# Patient Record
Sex: Female | Born: 1935 | Race: Black or African American | Hispanic: No | Smoking: Former smoker
Health system: Southern US, Community
[De-identification: ages and names within clinical notes are randomized; demographics above are authoritative.]

## PROBLEM LIST (undated history)

## (undated) DIAGNOSIS — I70229 Atherosclerosis of native arteries of extremities with rest pain, unspecified extremity: Secondary | ICD-10-CM

## (undated) DIAGNOSIS — I10 Essential (primary) hypertension: Secondary | ICD-10-CM

## (undated) DIAGNOSIS — M542 Cervicalgia: Secondary | ICD-10-CM

## (undated) DIAGNOSIS — I509 Heart failure, unspecified: Secondary | ICD-10-CM

## (undated) DIAGNOSIS — Z91148 Patient's other noncompliance with medication regimen for other reason: Secondary | ICD-10-CM

## (undated) DIAGNOSIS — Z8673 Personal history of transient ischemic attack (TIA), and cerebral infarction without residual deficits: Secondary | ICD-10-CM

## (undated) DIAGNOSIS — Z9114 Patient's other noncompliance with medication regimen: Secondary | ICD-10-CM

## (undated) DIAGNOSIS — R0602 Shortness of breath: Secondary | ICD-10-CM

## (undated) DIAGNOSIS — M1990 Unspecified osteoarthritis, unspecified site: Secondary | ICD-10-CM

## (undated) DIAGNOSIS — I340 Nonrheumatic mitral (valve) insufficiency: Secondary | ICD-10-CM

## (undated) DIAGNOSIS — I998 Other disorder of circulatory system: Secondary | ICD-10-CM

## (undated) DIAGNOSIS — I739 Peripheral vascular disease, unspecified: Secondary | ICD-10-CM

## (undated) DIAGNOSIS — I2510 Atherosclerotic heart disease of native coronary artery without angina pectoris: Secondary | ICD-10-CM

## (undated) DIAGNOSIS — I272 Pulmonary hypertension, unspecified: Secondary | ICD-10-CM

## (undated) DIAGNOSIS — E785 Hyperlipidemia, unspecified: Secondary | ICD-10-CM

## (undated) HISTORY — DX: Patient's other noncompliance with medication regimen for other reason: Z91.148

## (undated) HISTORY — DX: Hyperlipidemia, unspecified: E78.5

## (undated) HISTORY — DX: Pulmonary hypertension, unspecified: I27.20

## (undated) HISTORY — DX: Patient's other noncompliance with medication regimen: Z91.14

## (undated) HISTORY — DX: Essential (primary) hypertension: I10

## (undated) HISTORY — DX: Other disorder of circulatory system: I99.8

## (undated) HISTORY — DX: Atherosclerosis of native arteries of extremities with rest pain, unspecified extremity: I70.229

## (undated) HISTORY — DX: Nonrheumatic mitral (valve) insufficiency: I34.0

## (undated) HISTORY — DX: Shortness of breath: R06.02

## (undated) HISTORY — DX: Peripheral vascular disease, unspecified: I73.9

## (undated) HISTORY — PX: APPENDECTOMY: SHX54

## (undated) HISTORY — DX: Unspecified osteoarthritis, unspecified site: M19.90

## (undated) HISTORY — DX: Atherosclerotic heart disease of native coronary artery without angina pectoris: I25.10

## (undated) HISTORY — PX: OTHER SURGICAL HISTORY: SHX169

## (undated) HISTORY — DX: Personal history of transient ischemic attack (TIA), and cerebral infarction without residual deficits: Z86.73

## (undated) HISTORY — DX: Heart failure, unspecified: I50.9

## (undated) HISTORY — DX: Cervicalgia: M54.2

---

## 1997-06-27 ENCOUNTER — Encounter: Admission: RE | Admit: 1997-06-27 | Discharge: 1997-06-27 | Payer: Self-pay | Admitting: Family Medicine

## 1997-08-03 ENCOUNTER — Encounter: Admission: RE | Admit: 1997-08-03 | Discharge: 1997-08-03 | Payer: Self-pay | Admitting: Family Medicine

## 1997-08-11 ENCOUNTER — Encounter: Admission: RE | Admit: 1997-08-11 | Discharge: 1997-08-11 | Payer: Self-pay | Admitting: Family Medicine

## 1997-11-02 ENCOUNTER — Encounter: Admission: RE | Admit: 1997-11-02 | Discharge: 1997-11-02 | Payer: Self-pay | Admitting: Family Medicine

## 1997-12-21 ENCOUNTER — Encounter: Admission: RE | Admit: 1997-12-21 | Discharge: 1997-12-21 | Payer: Self-pay | Admitting: Family Medicine

## 1998-05-16 ENCOUNTER — Ambulatory Visit (HOSPITAL_COMMUNITY): Admission: RE | Admit: 1998-05-16 | Discharge: 1998-05-16 | Payer: Self-pay | Admitting: Sports Medicine

## 1998-05-16 ENCOUNTER — Encounter: Admission: RE | Admit: 1998-05-16 | Discharge: 1998-05-16 | Payer: Self-pay | Admitting: Sports Medicine

## 1998-06-14 ENCOUNTER — Encounter: Admission: RE | Admit: 1998-06-14 | Discharge: 1998-06-14 | Payer: Self-pay | Admitting: Family Medicine

## 1998-08-14 ENCOUNTER — Encounter: Admission: RE | Admit: 1998-08-14 | Discharge: 1998-08-14 | Payer: Self-pay | Admitting: Family Medicine

## 1998-08-22 ENCOUNTER — Encounter: Admission: RE | Admit: 1998-08-22 | Discharge: 1998-08-22 | Payer: Self-pay | Admitting: Family Medicine

## 1998-09-05 ENCOUNTER — Encounter: Admission: RE | Admit: 1998-09-05 | Discharge: 1998-09-05 | Payer: Self-pay | Admitting: Sports Medicine

## 1998-12-15 ENCOUNTER — Encounter: Payer: Self-pay | Admitting: Emergency Medicine

## 1998-12-15 ENCOUNTER — Emergency Department (HOSPITAL_COMMUNITY): Admission: EM | Admit: 1998-12-15 | Discharge: 1998-12-15 | Payer: Self-pay | Admitting: Emergency Medicine

## 1999-01-03 ENCOUNTER — Encounter: Admission: RE | Admit: 1999-01-03 | Discharge: 1999-01-03 | Payer: Self-pay | Admitting: Family Medicine

## 1999-03-12 DIAGNOSIS — I2510 Atherosclerotic heart disease of native coronary artery without angina pectoris: Secondary | ICD-10-CM

## 1999-03-12 HISTORY — DX: Atherosclerotic heart disease of native coronary artery without angina pectoris: I25.10

## 1999-04-18 ENCOUNTER — Encounter: Admission: RE | Admit: 1999-04-18 | Discharge: 1999-04-18 | Payer: Self-pay | Admitting: Family Medicine

## 1999-04-18 ENCOUNTER — Ambulatory Visit (HOSPITAL_COMMUNITY): Admission: RE | Admit: 1999-04-18 | Discharge: 1999-04-18 | Payer: Self-pay | Admitting: Family Medicine

## 1999-06-05 ENCOUNTER — Ambulatory Visit (HOSPITAL_COMMUNITY): Admission: RE | Admit: 1999-06-05 | Discharge: 1999-06-05 | Payer: Self-pay | Admitting: Cardiology

## 1999-06-05 HISTORY — PX: CARDIAC CATHETERIZATION: SHX172

## 2000-01-14 ENCOUNTER — Encounter: Admission: RE | Admit: 2000-01-14 | Discharge: 2000-01-14 | Payer: Self-pay | Admitting: Family Medicine

## 2000-04-10 ENCOUNTER — Encounter: Admission: RE | Admit: 2000-04-10 | Discharge: 2000-04-10 | Payer: Self-pay | Admitting: Family Medicine

## 2000-08-14 ENCOUNTER — Ambulatory Visit (HOSPITAL_COMMUNITY): Admission: RE | Admit: 2000-08-14 | Discharge: 2000-08-14 | Payer: Self-pay | Admitting: *Deleted

## 2000-08-14 ENCOUNTER — Encounter (INDEPENDENT_AMBULATORY_CARE_PROVIDER_SITE_OTHER): Payer: Self-pay | Admitting: Specialist

## 2000-09-17 ENCOUNTER — Encounter: Admission: RE | Admit: 2000-09-17 | Discharge: 2000-09-17 | Payer: Self-pay | Admitting: Family Medicine

## 2001-04-09 ENCOUNTER — Encounter: Admission: RE | Admit: 2001-04-09 | Discharge: 2001-04-09 | Payer: Self-pay | Admitting: Sports Medicine

## 2001-05-01 ENCOUNTER — Encounter: Admission: RE | Admit: 2001-05-01 | Discharge: 2001-05-01 | Payer: Self-pay | Admitting: Family Medicine

## 2001-09-16 ENCOUNTER — Encounter: Admission: RE | Admit: 2001-09-16 | Discharge: 2001-09-16 | Payer: Self-pay | Admitting: Family Medicine

## 2002-05-24 ENCOUNTER — Encounter: Admission: RE | Admit: 2002-05-24 | Discharge: 2002-05-24 | Payer: Self-pay | Admitting: Family Medicine

## 2004-03-14 ENCOUNTER — Ambulatory Visit: Payer: Self-pay | Admitting: Family Medicine

## 2004-04-02 ENCOUNTER — Ambulatory Visit: Payer: Self-pay | Admitting: Sports Medicine

## 2005-03-20 ENCOUNTER — Ambulatory Visit: Payer: Self-pay | Admitting: Family Medicine

## 2005-10-29 ENCOUNTER — Ambulatory Visit: Payer: Self-pay | Admitting: Sports Medicine

## 2006-05-08 DIAGNOSIS — I5022 Chronic systolic (congestive) heart failure: Secondary | ICD-10-CM

## 2006-05-08 DIAGNOSIS — I270 Primary pulmonary hypertension: Secondary | ICD-10-CM

## 2006-05-08 DIAGNOSIS — E669 Obesity, unspecified: Secondary | ICD-10-CM

## 2006-05-08 DIAGNOSIS — I10 Essential (primary) hypertension: Secondary | ICD-10-CM | POA: Insufficient documentation

## 2006-05-08 DIAGNOSIS — M1990 Unspecified osteoarthritis, unspecified site: Secondary | ICD-10-CM

## 2006-05-08 DIAGNOSIS — E7800 Pure hypercholesterolemia, unspecified: Secondary | ICD-10-CM

## 2006-05-08 DIAGNOSIS — I059 Rheumatic mitral valve disease, unspecified: Secondary | ICD-10-CM

## 2006-06-06 ENCOUNTER — Encounter: Payer: Self-pay | Admitting: Family Medicine

## 2006-07-24 ENCOUNTER — Telehealth: Payer: Self-pay | Admitting: Family Medicine

## 2006-08-07 ENCOUNTER — Ambulatory Visit: Payer: Self-pay | Admitting: Family Medicine

## 2006-08-07 ENCOUNTER — Encounter: Payer: Self-pay | Admitting: Family Medicine

## 2007-01-05 ENCOUNTER — Encounter: Payer: Self-pay | Admitting: Family Medicine

## 2007-01-28 ENCOUNTER — Encounter: Payer: Self-pay | Admitting: Family Medicine

## 2007-02-02 ENCOUNTER — Emergency Department (HOSPITAL_COMMUNITY): Admission: EM | Admit: 2007-02-02 | Discharge: 2007-02-02 | Payer: Self-pay | Admitting: *Deleted

## 2007-02-12 ENCOUNTER — Encounter: Payer: Self-pay | Admitting: Family Medicine

## 2007-03-02 ENCOUNTER — Telehealth: Payer: Self-pay | Admitting: *Deleted

## 2007-03-16 ENCOUNTER — Ambulatory Visit: Payer: Self-pay | Admitting: Family Medicine

## 2007-04-07 ENCOUNTER — Telehealth: Payer: Self-pay | Admitting: Family Medicine

## 2007-05-07 ENCOUNTER — Telehealth: Payer: Self-pay | Admitting: *Deleted

## 2007-05-12 ENCOUNTER — Encounter: Payer: Self-pay | Admitting: Family Medicine

## 2007-11-20 ENCOUNTER — Telehealth (INDEPENDENT_AMBULATORY_CARE_PROVIDER_SITE_OTHER): Payer: Self-pay | Admitting: *Deleted

## 2007-11-23 ENCOUNTER — Encounter: Payer: Self-pay | Admitting: *Deleted

## 2007-11-25 ENCOUNTER — Encounter: Payer: Self-pay | Admitting: *Deleted

## 2007-12-03 ENCOUNTER — Ambulatory Visit: Payer: Self-pay | Admitting: Family Medicine

## 2007-12-03 DIAGNOSIS — M25569 Pain in unspecified knee: Secondary | ICD-10-CM

## 2007-12-07 ENCOUNTER — Telehealth (INDEPENDENT_AMBULATORY_CARE_PROVIDER_SITE_OTHER): Payer: Self-pay | Admitting: Family Medicine

## 2007-12-07 ENCOUNTER — Encounter (INDEPENDENT_AMBULATORY_CARE_PROVIDER_SITE_OTHER): Payer: Self-pay | Admitting: *Deleted

## 2007-12-07 ENCOUNTER — Encounter (INDEPENDENT_AMBULATORY_CARE_PROVIDER_SITE_OTHER): Payer: Self-pay | Admitting: Family Medicine

## 2007-12-07 ENCOUNTER — Encounter: Payer: Self-pay | Admitting: Family Medicine

## 2008-04-28 ENCOUNTER — Encounter (INDEPENDENT_AMBULATORY_CARE_PROVIDER_SITE_OTHER): Payer: Self-pay | Admitting: Family Medicine

## 2008-06-03 ENCOUNTER — Ambulatory Visit: Payer: Self-pay | Admitting: Family Medicine

## 2008-06-03 ENCOUNTER — Encounter (INDEPENDENT_AMBULATORY_CARE_PROVIDER_SITE_OTHER): Payer: Self-pay | Admitting: Family Medicine

## 2008-06-03 DIAGNOSIS — Z780 Asymptomatic menopausal state: Secondary | ICD-10-CM | POA: Insufficient documentation

## 2008-06-13 ENCOUNTER — Telehealth (INDEPENDENT_AMBULATORY_CARE_PROVIDER_SITE_OTHER): Payer: Self-pay | Admitting: Family Medicine

## 2008-06-13 DIAGNOSIS — N183 Chronic kidney disease, stage 3 (moderate): Secondary | ICD-10-CM

## 2008-06-13 DIAGNOSIS — E559 Vitamin D deficiency, unspecified: Secondary | ICD-10-CM | POA: Insufficient documentation

## 2008-06-13 LAB — CONVERTED CEMR LAB
AST: 14 units/L (ref 0–37)
Alkaline Phosphatase: 47 units/L (ref 39–117)
BUN: 22 mg/dL (ref 6–23)
Glucose, Bld: 92 mg/dL (ref 70–99)
Sodium: 140 meq/L (ref 135–145)
Total Bilirubin: 0.3 mg/dL (ref 0.3–1.2)
Total Protein: 7.3 g/dL (ref 6.0–8.3)
Vit D, 25-Hydroxy: 6 ng/mL — ABNORMAL LOW (ref 30–89)

## 2008-06-23 ENCOUNTER — Encounter (INDEPENDENT_AMBULATORY_CARE_PROVIDER_SITE_OTHER): Payer: Self-pay | Admitting: Family Medicine

## 2008-09-05 ENCOUNTER — Encounter (INDEPENDENT_AMBULATORY_CARE_PROVIDER_SITE_OTHER): Payer: Self-pay | Admitting: Family Medicine

## 2009-02-20 ENCOUNTER — Telehealth: Payer: Self-pay | Admitting: Family Medicine

## 2009-03-28 ENCOUNTER — Telehealth: Payer: Self-pay | Admitting: Family Medicine

## 2009-04-04 ENCOUNTER — Ambulatory Visit: Payer: Self-pay | Admitting: Family Medicine

## 2009-04-04 ENCOUNTER — Encounter: Payer: Self-pay | Admitting: Family Medicine

## 2009-04-14 ENCOUNTER — Telehealth: Payer: Self-pay | Admitting: Family Medicine

## 2009-04-24 ENCOUNTER — Encounter: Payer: Self-pay | Admitting: Family Medicine

## 2009-04-24 DIAGNOSIS — D519 Vitamin B12 deficiency anemia, unspecified: Secondary | ICD-10-CM

## 2009-04-24 LAB — CONVERTED CEMR LAB
BUN: 24 mg/dL — ABNORMAL HIGH (ref 6–23)
CO2: 25 meq/L (ref 19–32)
Calcium: 9.9 mg/dL (ref 8.4–10.5)
Chloride: 102 meq/L (ref 96–112)
Cholesterol: 182 mg/dL (ref 0–200)
Creatinine, Ser: 1.07 mg/dL (ref 0.40–1.20)
HCT: 35.8 % — ABNORMAL LOW (ref 36.0–46.0)
HDL: 50 mg/dL (ref >39)
Hemoglobin: 11.2 g/dL — ABNORMAL LOW (ref 12.0–15.0)
MCV: 105.6 fL — ABNORMAL HIGH (ref 78.0–100.0)
Total CHOL/HDL Ratio: 3.6
Triglycerides: 80 mg/dL (ref <150)
WBC: 10.8 10*3/uL — ABNORMAL HIGH (ref 4.0–10.5)

## 2009-10-13 ENCOUNTER — Telehealth: Payer: Self-pay | Admitting: *Deleted

## 2009-10-17 ENCOUNTER — Ambulatory Visit: Payer: Self-pay | Admitting: Family Medicine

## 2009-10-17 ENCOUNTER — Encounter: Payer: Self-pay | Admitting: Family Medicine

## 2009-10-17 LAB — CONVERTED CEMR LAB
AST: 11 units/L (ref 0–37)
Albumin: 4.1 g/dL (ref 3.5–5.2)
Alkaline Phosphatase: 50 units/L (ref 39–117)
BUN: 24 mg/dL — ABNORMAL HIGH (ref 6–23)
Basophils Absolute: 0 10*3/uL (ref 0.0–0.1)
Basophils Relative: 0 % (ref 0–1)
Direct LDL: 91 mg/dL
Eosinophils Absolute: 0.7 10*3/uL (ref 0.0–0.7)
Homocysteine: 20.3 micromoles/L — ABNORMAL HIGH (ref 4.0–15.4)
MCHC: 31.7 g/dL (ref 30.0–36.0)
MCV: 102.9 fL — ABNORMAL HIGH (ref 78.0–100.0)
Monocytes Relative: 8 % (ref 3–12)
Neutrophils Relative %: 67 % (ref 43–77)
Platelets: 231 10*3/uL (ref 150–400)
Potassium: 4.7 meq/L (ref 3.5–5.3)
RDW: 12.9 % (ref 11.5–15.5)

## 2009-11-14 ENCOUNTER — Telehealth: Payer: Self-pay | Admitting: Family Medicine

## 2009-12-26 ENCOUNTER — Ambulatory Visit: Payer: Self-pay | Admitting: Cardiology

## 2009-12-26 ENCOUNTER — Encounter: Payer: Self-pay | Admitting: Family Medicine

## 2009-12-26 LAB — CONVERTED CEMR LAB
Hemoglobin: 11.4 g/dL
WBC: 9.9 10*3/uL

## 2009-12-27 ENCOUNTER — Encounter: Payer: Self-pay | Admitting: Family Medicine

## 2010-01-04 ENCOUNTER — Encounter: Payer: Self-pay | Admitting: Family Medicine

## 2010-01-25 ENCOUNTER — Encounter: Payer: Self-pay | Admitting: Family Medicine

## 2010-02-08 ENCOUNTER — Encounter: Payer: Self-pay | Admitting: Family Medicine

## 2010-04-10 NOTE — Assessment & Plan Note (Signed)
Summary: f/up,tcb   Vital Signs:  Patient profile:   77 year old female Weight:      175.1 pounds Temp:     97.4 degrees F oral Pulse rate:   71 / minute BP sitting:   141 / 72  (right arm) Cuff size:   large  Vitals Entered By: Garen Grams LPN (April 04, 2009 1:35 PM) CC: f/u HTN Is Patient Diabetic? No Pain Assessment Patient in pain? yes     Location: left knee   Primary Care Provider:  Delbert Harness MD  CC:  f/u HTN.  History of Present Illness: 77 yo here for follow-up of HTN, primary care  HYPERTENSION/CAD Meds: Taking and tolerating? yes Home BP's: no Chest Pain: no, uses NTG 1 per month Dyspnea: no Claudication: no Sees cardiologist Dr Clarene Duke  Vitamin D. Deficiency: never took vitamin D supplement that was prescribed last year.     Habits & Providers  Alcohol-Tobacco-Diet     Tobacco Status: never  Current Medications (verified): 1)  Spironolactone 25 Mg  Tabs (Spironolactone) .... Take One Tablet By Mouth Once Daily- Please Have Pt Schedule Appt Prior To Additional Refills 2)  Isosorbide Mononitrate Cr 60 Mg  Tb24 (Isosorbide Mononitrate) .... Take One Tablet Daily- Please Call Office For Appt 3)  Metoprolol Tartrate 50 Mg Tabs (Metoprolol Tartrate) .... Take 1 Tab By Mouth Two Times A Day 4)  Altace 10 Mg Caps (Ramipril) .Marland Kitchen.. 1 By Mouth Once A Day 5)  Amlodipine Besylate 10 Mg  Tabs (Amlodipine Besylate) .Marland Kitchen.. 1 Tablet A Day For Blood Pressure- Call Office For Appt!! 6)  Simvastatin 80 Mg  Tabs (Simvastatin) .Marland Kitchen.. 1 Tab By Mouth At Bedtime 7)  Furosemide 40 Mg Tabs (Furosemide) .... Take One Tablet Two Times A Day  - Prescribed By Dr. Sw[place]iland 8)  Bayer Aspirin 325 Mg Tabs (Aspirin) .... Takes 2 Tablets Daily Per Dr. Swaziland 9)  Nitroquick 0.3 Mg Subl (Nitroglycerin) .... Takes As Needed Per Cards  Allergies: No Known Drug Allergies  Social History: Lives at apartment with neice.  Performs all ADL's.  Widowed many years ago.  no etoh or tob  currently.  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    Karina Willis [phone].  Has an adopted son [name] in [place]. States she would like Vena Austria to be her HPOA.  Does not have one currently.  Full Code at this time.  Review of Systems      See HPI General:  Complains of weight loss; denies fever; intentional. Eyes:  Complains of blurring; denies eye pain. ENT:  Complains of nasal congestion. CV:  Complains of lightheadness; denies chest pain or discomfort, difficulty breathing while lying down, shortness of breath with exertion, and swelling of feet. Resp:  Complains of cough; denies shortness of breath. GI:  Denies abdominal pain, constipation, and diarrhea. GU:  Denies dysuria and incontinence. Neuro:  Denies falling down and weakness.  Physical Exam  General:  alert, well-developed, and well-nourished.  Elderly lady using a cane for balance.  Lungs:  normal respiratory effort, normal breath sounds, no crackles, and no wheezes.   Heart:  normal rate, regular rhythm, no gallop, and no rub. Very slight RUSB systolic murmur.   Impression & Recommendations:  Problem # 1:  HYPERTENSION, BENIGN SYSTEMIC (ICD-401.1) BP slightly above goal.  No changes today.  Continue current meds  Her updated medication list for this problem     Spironolactone 25 Mg Tabs (Spironolactone) .Marland KitchenMarland KitchenMarland KitchenMarland Kitchen  Take one tablet by mouth once daily- please have pt schedule appt prior to additional refills    Metoprolol Tartrate 50 Mg Tabs (Metoprolol tartrate) .Marland Kitchen... Take 1 tab by mouth two times a day    Altace 10 Mg Caps (Ramipril) .Marland Kitchen... 1 by mouth once a day    Amlodipine Besylate 10 Mg Tabs (Amlodipine besylate) .Marland Kitchen... 1 tablet a day for blood pressure- call office for appt!!    Furosemide 40 Mg Tabs (Furosemide) .Marland Kitchen... Take one tablet two times a day  - prescribed by dr. Swaziland  Orders: Comp Met-[facility] [phone]) Lipid-[facility] [phone]) CBC-[facility] ([id number]) [facility]- Est  Level 4  (13086)  Problem # 2:  RENAL INSUFFICIENCY (ICD-588.9) Patient unaware of this.  First noted by prior PCP at last visit.   Cr 1.37 at last visit 05/2008.  Will recheck today. Orders: Comp Met-[facility] ([id number]) CBC-[facility] ([id number]) [facility]- Est  Level 4 (24401)  Problem # 3:  UNSPECIFIED VITAMIN D DEFICIENCY (ICD-268.9)  On last check Vtamin D level was 6.  Never supplemented.  Sent prescription to pharmacy.  WIll recheck after course is complete.  Orders: [facility]- Est  Level 4 (02725)  Problem # 4:  Preventive Health Care (ICD-V70.0)  Flu vaccine given today. Discussed zostvax and finding out coverage discussed end of life wishes and documented in Social history.  Patient was given living will packet to go over with her neive. Patient declined colonoscopy, pap, mammogram.  Agreed this may be reasonable given patient's age and comorbidities.  Orders: [facility]- Est  Level 4 (99214)  Problem # 5:  HYPERCHOLESTEROLEMIA (ICD-272.0) will recheck fasting cholesterol today. Her updated medication list for this problem includes:    Simvastatin 80 Mg Tabs (Simvastatin) .Marland Kitchen... 1 tab by mouth at bedtime  Orders: Lipid-[facility] ([id number]) [facility]- Est  Level 4 (25956)  Complete Medication List: 1)  Spironolactone 25 Mg Tabs (Spironolactone) .... Take one tablet by mouth once daily- please have pt schedule appt prior to additional refills 2)  Isosorbide Mononitrate Cr 60 Mg Tb24 (Isosorbide mononitrate) .... Take one tablet daily- please call office for appt 3)  Metoprolol Tartrate 50 Mg Tabs (Metoprolol tartrate) .... Take 1 tab by mouth two times a day 4)  Altace 10 Mg Caps (Ramipril) .Marland Kitchen.. 1 by mouth once a day 5)  Amlodipine Besylate 10 Mg Tabs (Amlodipine besylate) .Marland Kitchen.. 1 tablet a day for blood pressure- call office for appt!! 6)  Simvastatin 80 Mg Tabs (Simvastatin) .Marland Kitchen.. 1 tab by mouth at bedtime 7)  Furosemide 40 Mg Tabs (Furosemide) .... Take one tablet two times a day  - prescribed by dr. Sw[place]il[place] 8)  Bayer  Aspirin 325 Mg Tabs (Aspirin) .... Takes 2 tablets daily per dr. [place] 9)  Nitroquick 0.3 Mg Subl (Nitroglycerin) .... Takes as needed per cards  Patient Instructions: 1)  Your vitamin D level is very low.  I will send a prescription for high dose vitamin D to [facility].  Please take this once a week until gone. 2)  Look over advance directives paperwork so that you can make sure your end of life wishes are followed and you can say who you would like to speak for you in case you are not able to speak for yourself. 3)  I think you shoudl get the shingles vaccine (zostavax).  Please ask your insurance company how much this will cost you. 4)  Dont forget to make follow-up appt with Dr. Swaziland. 5)  I'd like to see you again in 3-4 months.  Last Colonoscopy:  refused (12/03/2007 1:52:18 PM) Colonoscopy Result Date:  04/04/2009 Colonoscopy Next Due:  Refused Last PAP:  refused (12/03/2007 1:52:18 PM) PAP Next Due:  Refused Last Mammogram:  Done. (05/09/2001 12:00:00 AM) Mammogram Next Due:  Refused Bone Density Next Due: Refused   Prevention & Chronic Care Immunizations   Influenza vaccine: given  (12/03/2007)   Influenza vaccine due: 12/02/2008    Tetanus booster: 08/07/2006: Tdap   Tetanus booster due: 08/06/2016    Pneumococcal vaccine: Done.  (07/09/1996)   Pneumococcal vaccine due: None    H. zoster vaccine: Not documented  Colorectal Screening   Hemoccult: Done.  (03/16/2004)   Hemoccult due: Not Indicated    Colonoscopy: refused  (12/03/2007)   Colonoscopy due: Refused  (04/04/2009)  Other Screening   Pap smear: refused  (12/03/2007)   Pap smear due: Refused  (04/04/2009)    Mammogram: Done.  (05/09/2001)   Mammogram due: Refused  (04/04/2009)    DXA bone density scan: Not documented   DXA scan due: Refused  (04/04/2009)    Smoking status: never  (04/04/2009)  Lipids   Total Cholesterol: Not documented   LDL: Not documented   LDL Direct: 99  (06/03/2008)    HDL: Not documented   Triglycerides: Not documented    SGOT (AST): 14  (06/03/2008)   SGPT (ALT): <8 U/L  (06/03/2008) CMP ordered    Alkaline phosphatase: 47  (06/03/2008)   Total bilirubin: 0.3  (06/03/2008)    Lipid flowsheet reviewed?: Yes   Progress toward LDL goal: Unchanged  Hypertension   Last Blood Pressure: 141 / 72  (04/04/2009)   Serum creatinine: 1.37  (06/03/2008)   Serum potassium 4.5  (06/03/2008) CMP ordered     Hypertension flowsheet reviewed?: Yes   Progress toward BP goal: At goal  Self-Management Support :   Personal Goals (by the next clinic visit) :      Personal blood pressure goal: 140/90  (04/04/2009)     Personal LDL goal: 100  (04/04/2009)    Patient will work on the following items until the next clinic visit to reach self-care goals:     Medications and monitoring: take my medicines every day  (04/04/2009)     Eating: eat more vegetables, use fresh or frozen vegetables, eat foods that are low in salt  (04/04/2009)    Hypertension self-management support: Not documented    Lipid self-management support: Not documented     Appended Document: f/up,tcb    Clinical Lists Changes  Medications: Added new medication of VITAMIN D (ERGOCALCIFEROL) 50000 UNIT CAPS (ERGOCALCIFEROL) take one tablet a week for 8 weeks - Signed Rx of VITAMIN D (ERGOCALCIFEROL) 50000 UNIT CAPS (ERGOCALCIFEROL) take one tablet a week for 8 weeks;  #8 x 0;  Signed;  Entered by: Delbert Harness MD;  Authorized by: Delbert Harness MD;  Method used: Electronically to [facility] [id number], [place], [place], [place]  [address], Ph: [phone], Fax: [phone]    Prescriptions: VITAMIN D (ERGOCALCIFEROL) 50000 UNIT CAPS (ERGOCALCIFEROL) take one tablet a week for 8 weeks  #8 x 0   Entered and Authorized by:   Delbert Harness MD   Signed by:   Delbert Harness MD on 04/06/2009   Method used:   Electronically to        [facility] [id number] (retail)        [address]       [place]  [place], [place]  [address]       Ph: [phone]       Fax: [phone]   RxID:   [id number]

## 2010-04-10 NOTE — Consult Note (Signed)
Summary: [facility]  [facility]   Imported By: De Nurse 01/04/2010 12:13:27  _____________________________________________________________________  External Attachment:    Type:   Image     Comment:   External Document

## 2010-04-10 NOTE — Progress Notes (Signed)
Summary: Rx Req  Phone Note Refill Request Call back at Home Phone [phone] Message from:  Patient  Refills Requested: Medication #1:  SPIRONOLACTONE 25 MG  TABS Take one tablet by mouth once daily  Medication #2:  AMLODIPINE BESYLATE 10 MG  TABS 1 tablet a day for blood pressure  Medication #3:  ISOSORBIDE MONONITRATE CR 60 MG  TB24 Take one tablet daily [employer].   Initial call taken by: Clydell Hakim,  November 14, 2009 8:59 AM    New/Updated Medications: SPIRONOLACTONE 25 MG  TABS (SPIRONOLACTONE) Take one tablet by mouth once daily ISOSORBIDE MONONITRATE CR 60 MG  TB24 (ISOSORBIDE MONONITRATE) Take one tablet daily AMLODIPINE BESYLATE 10 MG  TABS (AMLODIPINE BESYLATE) 1 tablet a day for blood pressure Prescriptions: AMLODIPINE BESYLATE 10 MG  TABS (AMLODIPINE BESYLATE) 1 tablet a day for blood pressure  #30 x 5   Entered and Authorized by:   Delbert Harness MD   Signed by:   Delbert Harness MD on 11/14/2009   Method used:   Electronically to        [facility] [id number] (retail)       [address] [place], [place]  [address]       Ph: [phone]       Fax: [phone]   RxID:   [id number] ISOSORBIDE MONONITRATE CR 60 MG  TB24 (ISOSORBIDE MONONITRATE) Take one tablet daily  #30 x 5   Entered and Authorized by:   Delbert Harness MD   Signed by:   Delbert Harness MD on 11/14/2009   Method used:   Electronically to        [facility] [address] (retail)       [address] [place], [place]  [address]       Ph: [phone]       Fax: [phone]   RxID:   [id number] SPIRONOLACTONE 25 MG  TABS (SPIRONOLACTONE) Take one tablet by mouth once daily  #30 x 5   Entered and Authorized by:   Delbert Harness MD   Signed by:   Delbert Harness MD on 11/14/2009   Method used:   Electronically to        [facility] [address] (retail)       [address] [place], [place]  [address]    Ph: [phone]       Fax: [phone]   RxID:   [id number]   Appended Document: Rx Req notified pt that rxs sent in.

## 2010-04-10 NOTE — Miscellaneous (Signed)
Summary: Appointment Canceled  Appointment status changed to canceled by [employer] on 02/08/2010 8:35 AM.  Cancellation Comments --------------------- echo/ dyspena. gd  Appointment Information ----------------------- Appt Type:  CARDIOLOGY ANCILLARY VISIT      Date:  Thursday, February 08, 2010      Time:  1:00 PM for 60 min   Urgency:  Routine   Made By:  Pearson Grippe  To Visit:  [id number]    Reason:  echo/ dyspena. gd  Appt Comments ------------- -- 02/08/10 8:35: ([facility]) CANCELED -- echo/ dyspena. gd -- 01/26/10 16:37: ([facility]) BOOKED -- Routine CARDIOLOGY ANCILLARY VISIT at 02/08/2010 1:00 PM for 60 min echo/ dyspena. gd

## 2010-04-10 NOTE — Consult Note (Signed)
Summary: [facility]   [facility]   Imported By: Clydell Hakim 03/16/2009 11:48:06  _____________________________________________________________________  External Attachment:    Type:   Image     Comment:   External Document

## 2010-04-10 NOTE — Miscellaneous (Signed)
Summary: CMET, CBC, ESR, Urine Cx  Clinical Lists Changes urine culture- E. Coli pansestive. ESR 45 Observations: Added new observation of PLATELETK/UL: 247 K/uL (12/26/2009 9:35) Added new observation of MCV: 104 fL (12/26/2009 9:35) Added new observation of HGB: 11.4 g/dL (16/12/9602 5:40) Added new observation of WBC COUNT: 9.9 10*3/microliter (12/26/2009 9:35)

## 2010-04-10 NOTE — Assessment & Plan Note (Signed)
Summary: out of meds,tcb   Vital Signs:  Patient profile:   77 year old female Weight:      167 pounds Temp:     98.4 degrees F oral Pulse rate:   93 / minute BP sitting:   114 / 76  (right arm) Cuff size:   regular  Vitals Entered By: Jimmy Footman, CMA (October 17, 2009 1:52 PM) CC: out of medicine Is Patient Diabetic? No Pain Assessment Patient in pain? no        Primary Care Provider:  Delbert Harness MD  CC:  out of medicine.  History of Present Illness: 77 yo here for follow-up  HYPERTENSION/ CHF/ CAD Meds: Taking and tolerating? yes Home BP's no Chest Pain: no Dyspnea: no Edema: no SOB: no Has not seen her cardiologist in over a year  Vit D: completed course  Advanced Directives:  Desires her neice to be medical POA but has not formally done and paperwork.  Has not discussed end ofl ife wishes with her family.  Habits & Providers  Alcohol-Tobacco-Diet     Tobacco Status: never  Current Medications (verified): 1)  Spironolactone 25 Mg  Tabs (Spironolactone) .... Take One Tablet By Mouth Once Daily 2)  Isosorbide Mononitrate Cr 60 Mg  Tb24 (Isosorbide Mononitrate) .... Take One Tablet Daily 3)  Metoprolol Tartrate 50 Mg Tabs (Metoprolol Tartrate) .... Take 1 Tab By Mouth Two Times A Day 4)  Altace 10 Mg Caps (Ramipril) .Marland Kitchen.. 1 By Mouth Once A Day 5)  Amlodipine Besylate 10 Mg  Tabs (Amlodipine Besylate) .Marland Kitchen.. 1 Tablet A Day For Blood Pressure 6)  Simvastatin 80 Mg  Tabs (Simvastatin) .Marland Kitchen.. 1 Tab By Mouth At Bedtime 7)  Furosemide 40 Mg Tabs (Furosemide) .... Take One Tablet Two Times A Day  - Prescribed By Dr. [place] 8)  Bayer Aspirin 325 Mg Tabs (Aspirin) .... Takes 2 Tablets Daily Per Dr. [place] 9)  Nitroquick 0.3 Mg Subl (Nitroglycerin) .... Takes As Needed Per Cards 10)  Vitamin D (Ergocalciferol) 50000 Unit Caps (Ergocalciferol) .... Take One Tablet A Week For 8 Weeks  Allergies: No Known Drug Allergies  Social History: Lives at apartment with neice.   Performs all ADL's.  Widowed many years ago.  no etoh or tob currently.  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    Karina Willis [phone].  Has an adopted son Karina Willis in [place]. States she would like Karina Willis to be her HPOA.  Does not have one currently.  Continues to be Full Code at this time.  Review of Systems      See HPI  Physical Exam  General:  alert, well-developed, and well-nourished.  Elderly lady using a cane for balance.  Lungs:  normal respiratory effort, normal breath sounds, no crackles, and no wheezes.   Heart:  normal rate, regular rhythm, no gallop, and no rub. Very slight RUSB systolic murmur. Extremities:  no edema   Impression & Recommendations:  Problem # 1:  HYPERTENSION, BENIGN SYSTEMIC (ICD-401.1) [place] today than last time.  No orthostasis on history.  Will continue current meds.  If continues to be low, would consider slowly decreasing BP meds.    Her updated medication list for this problem includes:    Spironolactone 25 Mg Tabs (Spironolactone) .Marland Kitchen... Take one tablet by mouth once daily    Metoprolol Tartrate 50 Mg Tabs (Metoprolol tartrate) .Marland Kitchen... Take 1 tab by mouth two times a day    Altace 10 Mg  Caps (Ramipril) .Marland Kitchen... 1 by mouth once a day    Amlodipine Besylate 10 Mg Tabs (Amlodipine besylate) .Marland Kitchen... 1 tablet a day for blood pressure    Furosemide 40 Mg Tabs (Furosemide) .Marland Kitchen... Take one tablet two times a day  - prescribed by dr. [place]  Orders: Comp Met-[facility] [phone]) [facility]- Est  Level 4 (99214)  BP today: 114/76 Prior BP: 141/72 (04/04/2009)  Labs Reviewed: K+: 4.0 (04/04/2009) Creat: : 1.07 (04/04/2009)   Chol: 182 (04/04/2009)   HDL: 50 (04/04/2009)   LDL: 116 (04/04/2009)   TG: 80 (04/04/2009)  Problem # 2:  ANEMIA, MACROCYTIC (ICD-281.9) Uncear etiology.  WIll draw labs today.  Orders: CBC w/Diff-[facility] ([id number]) B12-[facility] [phone]) Folate-[facility] ([id number]) Miscellaneous Lab Charge-[facility] (34742) [facility]- Est  Level 4  (59563)  Problem # 3:  UNSPECIFIED VITAMIN D DEFICIENCY (ICD-268.9) Completed course.  Will draw Vit D level.  Orders: Vit D, 25 [place]-[facility] ([id number]) [facility]- Est  Level 4 (18841)  Problem # 4:  RENAL INSUFFICIENCY (ICD-588.9) Assessment: Unchanged  Was improved on last check.  Likely just an acute elevation in Cr.  Will recheck to make sure it has resolved.  Orders: [facility]- Est  Level 4 (66063)  Problem # 5:  CHF - EJECTION FRACTION < 50% (ICD-428.22) Doing well.  Asked to follow-up with caridology.  Her updated medication list for this problem includes:    Spironolactone 25 Mg Tabs (Spironolactone) .Marland Kitchen... Take one tablet by mouth once daily    Metoprolol Tartrate 50 Mg Tabs (Metoprolol tartrate) .Marland Kitchen... Take 1 tab by mouth two times a day    Altace 10 Mg Caps (Ramipril) .Marland Kitchen... 1 by mouth once a day    Furosemide 40 Mg Tabs (Furosemide) .Marland Kitchen... Take one tablet two times a day  - prescribed by dr. [place]iland    Bayer Aspirin 325 Mg Tabs (Aspirin) .Marland Kitchen... Takes 2 tablets daily per dr. Swaziland  Orders: [facility]- Est  Level 4 (01601)  Problem # 6:  PREVENTIVE HEALTH CARE (ICD-V70.0) discuss advanced directives.  Given booklet to discuss with niece.  Urged to get formal paperwork done as she would like her neiece to be her POA and not her son.  Full code.  Complete Medication List: 1)  Spironolactone 25 Mg Tabs (Spironolactone) .... Take one tablet by mouth once daily 2)  Isosorbide Mononitrate Cr 60 Mg Tb24 (Isosorbide mononitrate) .... Take one tablet daily 3)  Metoprolol Tartrate 50 Mg Tabs (Metoprolol tartrate) .... Take 1 tab by mouth two times a day 4)  Altace 10 Mg Caps (Ramipril) .Marland Kitchen.. 1 by mouth once a day 5)  Amlodipine Besylate 10 Mg Tabs (Amlodipine besylate) .Marland Kitchen.. 1 tablet a day for blood pressure 6)  Simvastatin 80 Mg Tabs (Simvastatin) .Marland Kitchen.. 1 tab by mouth at bedtime 7)  Furosemide 40 Mg Tabs (Furosemide) .... Take one tablet two times a day  - prescribed by dr. [place]il[place] 8)  Bayer Aspirin 325  Mg Tabs (Aspirin) .... Takes 2 tablets daily per dr. [place] 9)  Nitroquick 0.3 Mg Subl (Nitroglycerin) .... Takes as needed per cards 10)  Vitamin D (ergocalciferol) 50000 Unit Caps (Ergocalciferol) .... Take one tablet a week for 8 weeks  Other Orders: Direct LDL-[facility] ([id number])  Patient Instructions: 1)  We will recheck your labs today for yoru vitamin D and your anemia.   2)  It sounds like your cardiologist has plans for your cholesterol medicine- I will forward the most recent labwork to him.  Please make an appt to follow-up with him at  your earliest convenience. 3)  Please speak with your neice about getting medical power of attorney and advanced directives- see packet. 4)  Nice to see you!  Follow-up in 4 months.   Prevention & Chronic Care Immunizations   Influenza vaccine: given  (12/03/2007)   Influenza vaccine due: 12/02/2008    Tetanus booster: 08/07/2006: Tdap   Tetanus booster due: 08/06/2016    Pneumococcal vaccine: Done.  (07/09/1996)   Pneumococcal vaccine due: None    H. zoster vaccine: Not documented  Colorectal Screening   Hemoccult: Done.  (03/16/2004)   Hemoccult due: Not Indicated    Colonoscopy: refused  (12/03/2007)   Colonoscopy due: Refused  (04/04/2009)  Other Screening   Pap smear: refused  (12/03/2007)   Pap smear due: Refused  (04/04/2009)    Mammogram: Done.  (05/09/2001)   Mammogram due: Refused  (04/04/2009)    DXA bone density scan: Not documented   DXA scan due: Refused  (04/04/2009)    Smoking status: never  (10/17/2009)  Lipids   Total Cholesterol: 182  (04/04/2009)   LDL: 116  (04/04/2009)   LDL Direct: 99  (06/03/2008)   HDL: 50  (04/04/2009)   Triglycerides: 80  (04/04/2009)    SGOT (AST): 10  (04/04/2009)   SGPT (ALT): <8 U/L  (04/04/2009) CMP ordered    Alkaline phosphatase: 47  (04/04/2009)   Total bilirubin: 0.4  (04/04/2009)    Lipid flowsheet reviewed?: Yes   Progress toward LDL goal: At  goal  Hypertension   Last Blood Pressure: 114 / 76  (10/17/2009)   Serum creatinine: 1.07  (04/04/2009)   Serum potassium 4.0  (04/04/2009) CMP ordered     Hypertension flowsheet reviewed?: Yes   Progress toward BP goal: At goal  Self-Management Support :   Personal Goals (by the next clinic visit) :      Personal blood pressure goal: 130/80  (10/17/2009)     Personal LDL goal: 100  (04/04/2009)    Hypertension self-management support: Not documented    Hypertension self-management support not done because: Good outcomes  (10/17/2009)    Lipid self-management support: Not documented

## 2010-04-10 NOTE — Progress Notes (Signed)
Summary: Rx Req  Phone Note Refill Request Call back at Home Phone [phone] Message from:  Patient  Refills Requested: Medication #1:  AMLODIPINE BESYLATE 10 MG  TABS 1 tablet a day for blood pressure- call office for appt!!  Medication #2:  ISOSORBIDE MONONITRATE CR 60 MG  TB24 Take one tablet daily- Please call office for appt  Medication #3:  SPIRONOLACTONE 25 MG  TABS Take one tablet by mouth once daily- please have pt schedule appt prior to additional refills PT USES [facility].  PT IS COMING IN ON 04/04/09 FOR F/UP. HAD TO MISS LAST APPT DUE TO DEATH IN FAMILY.  Initial call taken by: Clydell Hakim,  March 28, 2009 8:48 AM  Follow-up for Phone Call        will forward to MD. Follow-up by: Theresia Lo RN,  March 28, 2009 8:50 AM    Prescriptions: AMLODIPINE BESYLATE 10 MG  TABS (AMLODIPINE BESYLATE) 1 tablet a day for blood pressure- call office for appt!!  #14 x 0   Entered and Authorized by:   Delbert Harness MD   Signed by:   Delbert Harness MD on 03/28/2009   Method used:   Electronically to        [facility] [address] (retail)       [address] [place], [place]  [address]       Ph: [phone]       Fax: [phone]   RxID:   [id number] SPIRONOLACTONE 25 MG  TABS (SPIRONOLACTONE) Take one tablet by mouth once daily- please have pt schedule appt prior to additional refills  #14 x 0   Entered and Authorized by:   Delbert Harness MD   Signed by:   Delbert Harness MD on 03/28/2009   Method used:   Electronically to        [facility] [address] (retail)       [address] [place], [place]  [address]       Ph: [phone]       Fax: [phone]   RxID:   [id number] ISOSORBIDE MONONITRATE CR 60 MG  TB24 (ISOSORBIDE MONONITRATE) Take one tablet daily- Please call office for appt  #14 x 0   Entered and Authorized by:   Delbert Harness MD   Signed by:   Delbert Harness MD on 03/28/2009   Method  used:   Electronically to        [facility] [address] (retail)       [address]       [place], [place]  [address]       Ph: [phone]       Fax: [phone]   RxID:   [id number]

## 2010-04-10 NOTE — Progress Notes (Signed)
Summary: refill;  Phone Note Refill Request Call back at Home Phone [phone] Message from:  Patient  Refills Requested: Medication #1:  SPIRONOLACTONE 25 MG  TABS Take one tablet by mouth once daily- please have pt schedule appt prior to additional refills  Medication #2:  AMLODIPINE BESYLATE 10 MG  TABS 1 tablet a day for blood pressure- call office for appt!!  Medication #3:  ISOSORBIDE MONONITRATE CR 60 MG  TB24 Take one tablet daily- Please call office for appt was supposed to have these refilled last week at [facility]  Initial call taken by: De Nurse,  April 14, 2009 9:34 AM  Follow-up for Phone Call        TO PCP Follow-up by: Golden Circle RN,  April 14, 2009 9:52 AM  Additional Follow-up for Phone Call Additional follow up Details #1::        Rx faxed to pharmacy Additional Follow-up by: Delbert Harness MD,  April 14, 2009 12:14 PM    New/Updated Medications: SPIRONOLACTONE 25 MG  TABS (SPIRONOLACTONE) Take one tablet by mouth once daily ISOSORBIDE MONONITRATE CR 60 MG  TB24 (ISOSORBIDE MONONITRATE) Take one tablet daily AMLODIPINE BESYLATE 10 MG  TABS (AMLODIPINE BESYLATE) 1 tablet a day for blood pressure SIMVASTATIN 80 MG  TABS (SIMVASTATIN) 1 tab by mouth at bedtime Prescriptions: SIMVASTATIN 80 MG  TABS (SIMVASTATIN) 1 tab by mouth at bedtime  #30 x 5   Entered and Authorized by:   Delbert Harness MD   Signed by:   Delbert Harness MD on 04/14/2009   Method used:   Electronically to        [facility] [address] (retail)       [address] [place], [place]  [address]       Ph: [phone]       Fax: [phone]   RxID:   [id number] AMLODIPINE BESYLATE 10 MG  TABS (AMLODIPINE BESYLATE) 1 tablet a day for blood pressure  #30 x 5   Entered and Authorized by:   Delbert Harness MD   Signed by:   Delbert Harness MD on 04/14/2009   Method used:   Electronically to        [facility] [address] (retail)       [address]     [place], [place]  [address]       Ph: [phone]       Fax: [phone]   RxID:   [id number] SPIRONOLACTONE 25 MG  TABS (SPIRONOLACTONE) Take one tablet by mouth once daily  #30 x 5   Entered and Authorized by:   Delbert Harness MD   Signed by:   Delbert Harness MD on 04/14/2009   Method used:   Electronically to        [facility] [address] (retail)       [address] [place], [place]  [address]       Ph: [phone]       Fax: [phone]   RxID:   [id number] ISOSORBIDE MONONITRATE CR 60 MG  TB24 (ISOSORBIDE MONONITRATE) Take one tablet daily  #30 x 5   Entered and Authorized by:   Delbert Harness MD   Signed by:   Delbert Harness MD on 04/14/2009  Method used:   Electronically to        [facility] [address] (retail)       [address] [place], [place]  [address]       Ph: [phone]       Fax: [phone]   RxID:   [id number]

## 2010-04-10 NOTE — Progress Notes (Signed)
Summary: Rx Req  Phone Note Refill Request Call back at Home Phone [phone] Message from:  Patient  Refills Requested: Medication #1:  SPIRONOLACTONE 25 MG  TABS Take one tablet by mouth once daily  Medication #2:  AMLODIPINE BESYLATE 10 MG  TABS 1 tablet a day for blood pressure  Medication #3:  ISOSORBIDE MONONITRATE CR 60 MG  TB24 Take one tablet daily [facility]  CAN SHE GET ENOUGH TO LAST HER TILL SHE COMES IN ON THE 9TH.  SHE IS OUT OF ALL OF THEM.  Initial call taken by: Clydell Hakim,  October 13, 2009 8:43 AM  Follow-up for Phone Call        Sent in enough for 76-month informed pt to keep appt. Follow-up by: Garen Grams LPN,  October 13, 2009 4:48 PM    Prescriptions: AMLODIPINE BESYLATE 10 MG  TABS (AMLODIPINE BESYLATE) 1 tablet a day for blood pressure  #30 x 0   Entered by:   Garen Grams LPN   Authorized by:   Delbert Harness MD   Signed by:   Garen Grams LPN on 57/84/6962   Method used:   Electronically to        [facility] [address] (retail)       [address]       [place], [place]  [address]       Ph: [phone]       Fax: [phone]   RxID:   [id number] ISOSORBIDE MONONITRATE CR 60 MG  TB24 (ISOSORBIDE MONONITRATE) Take one tablet daily  #30 x 0   Entered by:   Garen Grams LPN   Authorized by:   Delbert Harness MD   Signed by:   Garen Grams LPN on 95/18/8416   Method used:   Electronically to        [facility] [address] (retail)       [address]       [place], [place]  [address]       Ph: [phone]       Fax: [phone]   RxID:   [id number] SPIRONOLACTONE 25 MG  TABS (SPIRONOLACTONE) Take one tablet by mouth once daily  #30 x 0   Entered by:   Garen Grams LPN   Authorized by:   Delbert Harness MD   Signed by:   Garen Grams LPN on 60/73/7106   Method used:   Electronically to        [facility] [address] (retail)       [address]       [place], [place]  [address]       Ph: [phone]       Fax: [phone]   RxID:   [id number]

## 2010-04-10 NOTE — Miscellaneous (Signed)
Summary: Appointment Canceled  Appointment status changed to canceled by [employer] on 01/04/2010 8:49 AM.  Cancellation Comments --------------------- echo/dyspnea/mt  Appointment Information ----------------------- Appt Type:  CARDIOLOGY ANCILLARY VISIT      Date:  Friday, January 05, 2010      Time:  11:30 AM for 60 min   Urgency:  Routine   Made By:  Pearson Grippe  To Visit:  [id number]    Reason:  echo/dyspnea/mt  Appt Comments ------------- -- 01/04/10 8:49: ([facility]) CANCELED -- echo/dyspnea/mt -- 01/02/10 11:46: ([facility]) BOOKED -- Routine CARDIOLOGY ANCILLARY VISIT at 01/05/2010 11:30 AM for 60 min echo/dyspnea/mt -- 12/26/09 15:45: ([facility]) BOOKED -- Routine CARDIOLOGY ANCILLARY VISIT at 1

## 2010-04-10 NOTE — Letter (Signed)
Summary: Results Follow-up Letter  [facility]  [address]   [place], [place] [address]   Phone: [phone]  Fax: [phone]    04/24/2009  [address] [place], [place]  [address]  Dear [name],   The following are the results of your recent test(s):  Your bloodwork shows you are anemic (low hemoglobin).  I would like you to make sure you are taking a daily multivitamin and schedule an appointment to talk about results and get labwork done.  Sincerely,  Delbert Harness MD [facility]           Appended Document: Results Follow-up Letter mailed.

## 2010-04-10 NOTE — Miscellaneous (Signed)
Summary: systolic heart failure  Clinical Lists Changes  Problems: Changed problem from CHF - EJECTION FRACTION < 50% (ICD-428.22) to CHRONIC SYSTOLIC HEART FAILURE (ICD-428.22)   Per last cardiology note from Dr. Swaziland

## 2010-07-24 ENCOUNTER — Encounter: Payer: Self-pay | Admitting: Sports Medicine

## 2010-07-27 NOTE — Procedures (Signed)
[facility]  Patient:    Karina Willis, Karina Willis                       MRN: [id number] Proc. Date: 08/14/00 Adm. Date:  [id number] Attending:  Sabino Gasser CC:         Peter M. Swaziland, M.D.   Procedure Report  PROCEDURE:  Colonoscopy.  INDICATIONS:  Family history of colon polyps, colon cancer screening.  ANESTHESIA:  Demerol 60 mg, Versed 5 mg.  DESCRIPTION OF PROCEDURE:  With the patient mildly sedated in the left lateral decubitus position, subsequently rolled to her back, the Olympus videoscopic colonoscope was inserted into the rectum and passed under direct vision to the cecum, identified by the ileocecal valve and appendiceal orifice.  Adjacent to the appendiceal orifice was a bilobulated, small polyp, approximately 7 mm in length and using hot biopsy forceps technique, this was biopsied.  There was some bleeding with this, and we had to cauterize the remainder of the polyp base.  At this point, there was no further bleeding noted.  The colonoscope was then withdrawn taking circumferential views of the entire colonic mucosa stopping in only then in the sigmoid colon area where diverticulosis was noted, until we pulled back to the rectum which appeared normal on direct view and showed normal on retroflexed view as well.  The endoscope was straightened and withdrawn.  The patients vital signs and pulse oximeter remained stable. The patient tolerated the procedure well without apparent complications.  FINDINGS:  Polyp of cecum, removed.  Diverticulosis of sigmoid colon.  PLAN:  We will ask the patient to hold aspirin for at least a week for the time being.  Have patient call me for results of biopsy and follow up with me as an outpatient. DD:  08/14/00 TD:  08/14/00 Job: [id number]

## 2010-07-27 NOTE — Cardiovascular Report (Signed)
[facility]  Patient:    Karina Willis, Karina Willis                       MRN: [id number] Proc. Date: 06/05/99 Adm. Date:  [id number] Disc. Date: [id number] Attending:  [place], Peter Manning                        Cardiac Catheterization  PROCEDURES:  Right and left heart catheterization, coronary angiography.  INDICATION FOR PROCEDURE:  The patient is a 77 year old black female who presents with evidence of congestive heart failure with symptoms of dyspnea and chest pain on exertion.  ACCESS:  Via the right femoral artery and vein using standard Seldinger technique.  EQUIPMENT:  The 6-French 4-cm right and left Judkins catheters, a 6-French pigtail catheter, a 6-French arterial sheath, an 8-French venous sheath, a 7-French balloon-tip Swan-Ganz catheter.  CONTRAST:  Omnipaque, 170 cc.  MEDICATIONS:  IV Versed, 1 mg.  COMMENTARY:  The patient tolerated the procedure well without complications.  HEMODYNAMIC DATA:  Aortic pressure is 177/91 with a mean of 125.  Left ventricular pressure is 175 with an EDP of 24 mmHg.  Right atrial pressure is 16/11 with a mean of 11 mmHg.  Right ventricular pressure is 67 with an EDP of 15 mmHg.  Pulmonary artery pressure is 80/45 with a mean of 59 mmHg. Pulmonary capillary wedge pressure is 46/50 with a mean of 44 mmHg.  By simultaneous recordings, there does not appear to be significant aortic or mitral valve gradients.  While V waves were somewhat prominent, they do not appear to be giant V waves.  There was no significant evidence of a shunt. Cardiac output by thermodilution is 3.2 liters/minute with an index of 1.65. Cardiac output by Fick is 3.27 with an index of 1.68.  Pulmonary vascular resistance was 5.6 Wood units.  ANGIOGRAPHIC DATA:  A left ventricular angiography was performed in the RAO view.  This demonstrates enlarged left ventricular chamber size.  There is akinesia of the inferior base.  Otherwise,  global hypokinesia.  Exact ejection fraction was difficult to estimate given significant ventricular ectopy; however, left ventricular function appears to be moderately reduced with ejection fraction estimated at 35-40%.  There appears to be moderate to severe mitral insufficiency, graded at 3+.  Again, this appears worse with ventricular ectopy.  The aortic valve appears normal.  1. The left coronary demonstrates a shared ostium with the LAD and left    circumflex. 2. The LAD is a very large vessel which wraps around the apex and supplies    part of the inferior wall.  This vessel has a 50% stenosis in the mid    vessel; otherwise, diffuse wall irregularities. 3. The left circumflex coronary has an 80% stenosis at its ostium.  It has    a 99% stenosis prior to the first obtuse marginal vessel and then is    occluded. 4. The right coronary artery arises normally and is occluded proximally.  FINAL INTERPRETATION: 1. Severe two-vessel occlusive atherosclerotic coronary artery disease. 2. Borderline stenosis in the mid left anterior descending artery. 3. Moderate left ventricular dysfunction. 4. Moderate to severe mitral insufficiency. 5. Severe pulmonary hypertension with elevated left ventricular filling    pressures. DD:  06/08/99 TD:  06/09/99 Job: [id number]

## 2010-07-30 ENCOUNTER — Encounter: Payer: Self-pay | Admitting: Cardiology

## 2010-07-30 DIAGNOSIS — Z8673 Personal history of transient ischemic attack (TIA), and cerebral infarction without residual deficits: Secondary | ICD-10-CM | POA: Insufficient documentation

## 2010-07-30 DIAGNOSIS — I10 Essential (primary) hypertension: Secondary | ICD-10-CM | POA: Insufficient documentation

## 2010-07-30 DIAGNOSIS — M1990 Unspecified osteoarthritis, unspecified site: Secondary | ICD-10-CM | POA: Insufficient documentation

## 2010-07-30 DIAGNOSIS — I340 Nonrheumatic mitral (valve) insufficiency: Secondary | ICD-10-CM | POA: Insufficient documentation

## 2010-07-30 DIAGNOSIS — E785 Hyperlipidemia, unspecified: Secondary | ICD-10-CM | POA: Insufficient documentation

## 2010-07-30 DIAGNOSIS — I5032 Chronic diastolic (congestive) heart failure: Secondary | ICD-10-CM | POA: Insufficient documentation

## 2010-07-30 DIAGNOSIS — I2510 Atherosclerotic heart disease of native coronary artery without angina pectoris: Secondary | ICD-10-CM | POA: Insufficient documentation

## 2010-07-30 DIAGNOSIS — R0602 Shortness of breath: Secondary | ICD-10-CM | POA: Insufficient documentation

## 2010-08-01 ENCOUNTER — Ambulatory Visit: Payer: Medicare Other | Admitting: Cardiology

## 2010-08-01 ENCOUNTER — Ambulatory Visit (INDEPENDENT_AMBULATORY_CARE_PROVIDER_SITE_OTHER): Payer: Medicaid Other | Admitting: Cardiology

## 2010-08-01 ENCOUNTER — Encounter: Payer: Self-pay | Admitting: Cardiology

## 2010-08-01 DIAGNOSIS — I2510 Atherosclerotic heart disease of native coronary artery without angina pectoris: Secondary | ICD-10-CM

## 2010-08-01 DIAGNOSIS — I10 Essential (primary) hypertension: Secondary | ICD-10-CM

## 2010-08-01 DIAGNOSIS — I059 Rheumatic mitral valve disease, unspecified: Secondary | ICD-10-CM

## 2010-08-01 DIAGNOSIS — I509 Heart failure, unspecified: Secondary | ICD-10-CM

## 2010-08-01 DIAGNOSIS — I340 Nonrheumatic mitral (valve) insufficiency: Secondary | ICD-10-CM

## 2010-08-01 MED ORDER — METOPROLOL TARTRATE 50 MG PO TABS: 50.0000 mg | ORAL_TABLET | Freq: Two times a day (BID) | ORAL | Status: DC

## 2010-08-01 MED ORDER — SIMVASTATIN 40 MG PO TABS: 40.0000 mg | ORAL_TABLET | Freq: Every day | ORAL | Status: DC

## 2010-08-01 MED ORDER — ISOSORBIDE MONONITRATE ER 60 MG PO TB24: 60.0000 mg | ORAL_TABLET | Freq: Every day | ORAL | Status: DC

## 2010-08-01 MED ORDER — SPIRONOLACTONE 25 MG PO TABS: 25.0000 mg | ORAL_TABLET | Freq: Every day | ORAL | Status: DC

## 2010-08-01 MED ORDER — FUROSEMIDE 40 MG PO TABS: 40.0000 mg | ORAL_TABLET | Freq: Two times a day (BID) | ORAL | Status: DC

## 2010-08-01 MED ORDER — AMLODIPINE BESYLATE 10 MG PO TABS: 10.0000 mg | ORAL_TABLET | Freq: Every day | ORAL | Status: DC

## 2010-08-01 MED ORDER — NITROGLYCERIN 0.4 MG SL SUBL: 0.4000 mg | SUBLINGUAL_TABLET | SUBLINGUAL | Status: DC | PRN

## 2010-08-01 MED ORDER — RAMIPRIL 10 MG PO CAPS: 10.0000 mg | ORAL_CAPSULE | Freq: Every day | ORAL | Status: DC

## 2010-08-01 NOTE — Assessment & Plan Note (Signed)
She has chronic moderate to severe mitral insufficiency. We will need to reassess this with echocardiogram. I think it is unlikely that she would be a surgical candidate.

## 2010-08-01 NOTE — Assessment & Plan Note (Signed)
She is not having significant anginal symptoms at this time. We will resume her prior medications.

## 2010-08-01 NOTE — Assessment & Plan Note (Signed)
Blood pressure severely elevated today related to medical noncompliance and dietary noncompliance. Again we'll resume her medications and sodium restriction. We will followup again in 3 weeks to monitor her response.

## 2010-08-01 NOTE — Progress Notes (Signed)
Karina Willis Date of Birth: Apr 11, 1932   History of Present Illness: Karina Willis is seen today for followup. She reports that she ran out of all of her medications over 2 weeks ago. She now complains of increasing shortness of breath on exertion, weight gain, edema, and headache. She denies any significant chest pain. She cannot explain why her medications lapsed.  Current Outpatient Prescriptions on File Prior to Visit  Medication Sig Dispense Refill  . aspirin 325 MG tablet Takes 2 tablets daily per Dr. Swaziland       . cyanocobalamin ([facility] VITAMIN B12) 2000 MCG tablet Take 2,000 mcg by mouth daily.        . ergocalciferol (VITAMIN D2) 50000 UNIT capsule Take 50,000 Units by mouth once a week. For 8 weeks for low vitamin D levels.       Marland Kitchen DISCONTD: amLODipine (NORVASC) 10 MG tablet Take 10 mg by mouth daily. For blood pressure       . DISCONTD: isosorbide mononitrate (IMDUR) 60 MG 24 hr tablet Take 60 mg by mouth daily.        Marland Kitchen DISCONTD: metoprolol (LOPRESSOR) 50 MG tablet Take 50 mg by mouth 2 (two) times daily.        Marland Kitchen DISCONTD: nitroGLYCERIN (NITROQUICK) 0.3 MG SL tablet Takes as needed per cards       . DISCONTD: ramipril (ALTACE) 10 MG capsule Take 10 mg by mouth daily.        Marland Kitchen DISCONTD: simvastatin (ZOCOR) 40 MG tablet Take 80 mg by mouth at bedtime.       Marland Kitchen DISCONTD: spironolactone (ALDACTONE) 25 MG tablet Take 25 mg by mouth daily.        Marland Kitchen DISCONTD: furosemide (LASIX) 40 MG tablet Take 40 mg by mouth 2 (two) times daily. ( NOT TAKING )        Allergies  Allergen Reactions  . Penicillins     Past Medical History  Diagnosis Date  . CHF (congestive heart failure)   . Systolic dysfunction     CHRONIC, EF 25-30%  . Mitral insufficiency     SEVERE  . Coronary artery disease 2001    WITH DOCUMENTED TOTAL OCCLUSION OF THE RIGHT CORONARY AND THE LEFT CIRCUMFLEX CORONARY  . SOB (shortness of breath)   . Hypertension   . Hyperlipidemia   . History of TIAs   . Arthritis      Past Surgical History  Procedure Date  . Cardiac catheterization 06/05/1999    ENLARGED LEFT VENTRICULAR SIZE. THERE IS AKINESIA OF THE INFERIOR BASE. LEFT VENTRICULAR  FUNCTIONS APPEAR TO BE MODERATELY REDUCED WITH EF 35-40%  . Appendectomy   . Left arm surgery     History  Smoking status  . Never Smoker   Smokeless tobacco  . Never Used    History  Alcohol Use No    Family History  Problem Relation Age of Onset  . Heart attack Mother   . Heart attack Father   . Heart disease Sister   . Heart attack Brother   . Heart attack Brother   . Heart attack Brother   . Heart disease Sister     Review of Systems: The review of systems is positive for weight gain of 17 pounds.  She has increased edema and dyspnea on exertion. She denies any dyspnea or palpitations. She's had no chest pain. She admits that she cooks with salt but doesn't seize and her food after it is cooked.All other systems  were reviewed and are negative.  Physical Exam: BP 188/106  Pulse 88  Ht 4\' 11"  (1.499 m)  Wt 184 lb (83.462 kg)  BMI 37.16 kg/m2 She is an obese black female who is short of breath walking just 10 feet. She walks with a cane. Her HEENT exam is unremarkable. She has poor dentition. She has positive jugular venous distention. There are no bruits. Lungs reveal diminished breath sounds in the bases. Cardiac exam reveals a grade 2/6 systolic murmur the apex without S3. Abdomen is obese, soft, nontender without masses or bruits. Her pedal pulses are palpable. She has 2+ edema. LABORATORY DATA:   Assessment / Plan:

## 2010-08-01 NOTE — Patient Instructions (Signed)
We will refill all your medications today.  Do not eat or cook with salt.  Please take your medications as they are prescribed. If you are going to run out let us know.  We will schedule a follow up visit with Lawson Fiscal our nurse practitioner in 3 weeks with lab work.

## 2010-08-01 NOTE — Assessment & Plan Note (Signed)
We scheduled Karina Willis for an echocardiogram in October. She failed to show. She is clearly volume overloaded at this point with medical and dietary noncompliance. We will resume all of her prior medications. I have stressed the importance of taking her medications as prescribed. We also reviewed recommendations for sodium restriction including not cooking or seasoning with salt. I'll have her return in 3 weeks for followup we will check repeat lab work at that time. Once she is better compensated we can reorder an echocardiogram.

## 2010-08-20 ENCOUNTER — Other Ambulatory Visit: Payer: Self-pay | Admitting: *Deleted

## 2010-08-20 DIAGNOSIS — E785 Hyperlipidemia, unspecified: Secondary | ICD-10-CM

## 2010-08-20 DIAGNOSIS — R0602 Shortness of breath: Secondary | ICD-10-CM

## 2010-08-21 ENCOUNTER — Other Ambulatory Visit (INDEPENDENT_AMBULATORY_CARE_PROVIDER_SITE_OTHER): Payer: Medicaid Other | Admitting: *Deleted

## 2010-08-21 ENCOUNTER — Other Ambulatory Visit: Payer: Self-pay | Admitting: Nurse Practitioner

## 2010-08-21 ENCOUNTER — Ambulatory Visit (INDEPENDENT_AMBULATORY_CARE_PROVIDER_SITE_OTHER): Payer: Medicaid Other | Admitting: Nurse Practitioner

## 2010-08-21 ENCOUNTER — Encounter: Payer: Self-pay | Admitting: Nurse Practitioner

## 2010-08-21 VITALS — BP 128/70 | HR 66 | Wt 172.1 lb

## 2010-08-21 DIAGNOSIS — R0602 Shortness of breath: Secondary | ICD-10-CM

## 2010-08-21 DIAGNOSIS — I10 Essential (primary) hypertension: Secondary | ICD-10-CM

## 2010-08-21 DIAGNOSIS — I059 Rheumatic mitral valve disease, unspecified: Secondary | ICD-10-CM

## 2010-08-21 DIAGNOSIS — E785 Hyperlipidemia, unspecified: Secondary | ICD-10-CM

## 2010-08-21 DIAGNOSIS — I509 Heart failure, unspecified: Secondary | ICD-10-CM

## 2010-08-21 DIAGNOSIS — I340 Nonrheumatic mitral (valve) insufficiency: Secondary | ICD-10-CM

## 2010-08-21 LAB — HEPATIC FUNCTION PANEL
ALT: 7 U/L (ref 0–35)
AST: 14 U/L (ref 0–37)
Albumin: 4.2 g/dL (ref 3.5–5.2)
Alkaline Phosphatase: 45 U/L (ref 39–117)
Bilirubin, Direct: 0.1 mg/dL (ref 0.0–0.3)
Total Bilirubin: 0.6 mg/dL (ref 0.3–1.2)
Total Protein: 7.7 g/dL (ref 6.0–8.3)

## 2010-08-21 LAB — BASIC METABOLIC PANEL
BUN: 25 mg/dL — ABNORMAL HIGH (ref 6–23)
CO2: 23 mEq/L (ref 19–32)
Calcium: 9.5 mg/dL (ref 8.4–10.5)
Chloride: 104 mEq/L (ref 96–112)
Creatinine, Ser: 1.4 mg/dL — ABNORMAL HIGH (ref 0.4–1.2)
GFR: 45.47 mL/min — ABNORMAL LOW (ref >60.00)
Glucose, Bld: 94 mg/dL (ref 70–99)
Potassium: 4.2 mEq/L (ref 3.5–5.1)
Sodium: 135 mEq/L (ref 135–145)

## 2010-08-21 LAB — CBC WITH DIFFERENTIAL/PLATELET
Basophils Absolute: 0 10*3/uL (ref 0.0–0.1)
Basophils Relative: 0.7 % (ref 0.0–3.0)
Eosinophils Absolute: 0.2 10*3/uL (ref 0.0–0.7)
Eosinophils Relative: 2.3 % (ref 0.0–5.0)
HCT: 37 % (ref 36.0–46.0)
Hemoglobin: 12.3 g/dL (ref 12.0–15.0)
Lymphocytes Relative: 20.1 % (ref 12.0–46.0)
Lymphs Abs: 1.5 10*3/uL (ref 0.7–4.0)
MCHC: 33.3 g/dL (ref 30.0–36.0)
MCV: 102 fl — ABNORMAL HIGH (ref 78.0–100.0)
Monocytes Absolute: 0.7 10*3/uL (ref 0.1–1.0)
Monocytes Relative: 9.3 % (ref 3.0–12.0)
Neutro Abs: 5.1 10*3/uL (ref 1.4–7.7)
Neutrophils Relative %: 67.6 % (ref 43.0–77.0)
Platelets: 193 10*3/uL (ref 150.0–400.0)
RBC: 3.63 Mil/uL — ABNORMAL LOW (ref 3.87–5.11)
RDW: 14.4 % (ref 11.5–14.6)
WBC: 7.5 10*3/uL (ref 4.5–10.5)

## 2010-08-21 LAB — LIPID PANEL
Cholesterol: 223 mg/dL — ABNORMAL HIGH (ref 0–200)
HDL: 63.3 mg/dL (ref >39.00)
Total CHOL/HDL Ratio: 4
Triglycerides: 74 mg/dL (ref 0.0–149.0)
VLDL: 14.8 mg/dL (ref 0.0–40.0)

## 2010-08-21 LAB — BRAIN NATRIURETIC PEPTIDE: Pro B Natriuretic peptide (BNP): 373 pg/mL — ABNORMAL HIGH (ref 0.0–100.0)

## 2010-08-21 LAB — LDL CHOLESTEROL, DIRECT: Direct LDL: 140.9 mg/dL

## 2010-08-21 NOTE — Assessment & Plan Note (Signed)
Blood pressure looks better. She has not had her meds today due to having labs drawn. I will see her back in about 6 weeks.

## 2010-08-21 NOTE — Patient Instructions (Signed)
Watch your salt and try to minimize your use of salt It would be a good idea to get some scales and weigh each day. Stay on your medicines except try to avoid using the Advil We will call you with your lab results I want to see you in about 6 weeks. We will consider ordering an ultrasound at that visit.

## 2010-08-21 NOTE — Assessment & Plan Note (Addendum)
She looks better. I have left her on her current regimen. I encouraged her to watch her salt. I have asked her to stop her use of NSAIDS and to use tylenol instead. Obtaining a set of scales would be great if possible. Her labs are checked today. I will see her back in about 6 weeks. May try to increase her ACE on return. Will see what her labs look like as well. We will order an echo on return.

## 2010-08-21 NOTE — Progress Notes (Signed)
Karina Willis Date of Birth: 14-Sep-1932   History of Present Illness: Karina Willis is seen back today for a 3 week check. She is seen for Dr. Swaziland. She is now back on her CHF meds. She had run out of them and became short of breath. Her weight is down about 8 pounds. She really likes salt. She does not have scales to weigh on. She is using Advil for mild discomfort and states a bottle will last about one month. She is not short of breath and now has less edema. She did not take her medicines today due to having labs drawn.  Current Outpatient Prescriptions on File Prior to Visit  Medication Sig Dispense Refill  . amLODipine (NORVASC) 10 MG tablet Take 1 tablet (10 mg total) by mouth daily. For blood pressure  30 tablet  5  . aspirin 325 MG tablet Takes 2 tablets daily per Dr. Swaziland       . furosemide (LASIX) 40 MG tablet Take 1 tablet (40 mg total) by mouth 2 (two) times daily. ( NOT TAKING )  30 tablet  5  . isosorbide mononitrate (IMDUR) 60 MG 24 hr tablet Take 1 tablet (60 mg total) by mouth daily.  30 tablet  5  . metoprolol (LOPRESSOR) 50 MG tablet Take 1 tablet (50 mg total) by mouth 2 (two) times daily.  60 tablet  5  . nitroGLYCERIN (NITROQUICK) 0.4 MG SL tablet Place 1 tablet (0.4 mg total) under the tongue every 5 (five) minutes as needed for chest pain. Takes as needed per cards  25 tablet  11  . ramipril (ALTACE) 10 MG capsule Take 1 capsule (10 mg total) by mouth daily.  30 capsule  5  . simvastatin (ZOCOR) 40 MG tablet Take 1 tablet (40 mg total) by mouth at bedtime.  30 tablet  5  . spironolactone (ALDACTONE) 25 MG tablet Take 1 tablet (25 mg total) by mouth daily.  30 tablet  5  . cyanocobalamin ([facility] VITAMIN B12) 2000 MCG tablet Take 2,000 mcg by mouth daily.        . ergocalciferol (VITAMIN D2) 50000 UNIT capsule Take 50,000 Units by mouth once a week. For 8 weeks for low vitamin D levels.         Allergies  Allergen Reactions  . Penicillins Hives and Itching    Past  Medical History  Diagnosis Date  . CHF (congestive heart failure)   . Systolic dysfunction     CHRONIC, EF 25-30%  . Mitral insufficiency     SEVERE  . Coronary artery disease 2001    WITH DOCUMENTED TOTAL OCCLUSION OF THE RIGHT CORONARY AND THE LEFT CIRCUMFLEX CORONARY  . SOB (shortness of breath)   . Hypertension   . Hyperlipidemia   . History of TIAs   . Arthritis   . Non compliance w medication regimen     Past Surgical History  Procedure Date  . Cardiac catheterization 06/05/1999    ENLARGED LEFT VENTRICULAR SIZE. THERE IS AKINESIA OF THE INFERIOR BASE. LEFT VENTRICULAR  FUNCTIONS APPEAR TO BE MODERATELY REDUCED WITH EF 35-40%  . Appendectomy   . Left arm surgery     History  Smoking status  . Former Smoker  . Types: Cigarettes  . Quit date: 08/21/1983  Smokeless tobacco  . Never Used    History  Alcohol Use No    Family History  Problem Relation Age of Onset  . Heart attack Mother   .  Heart attack Father   . Heart disease Sister   . Heart attack Brother   . Heart attack Brother   . Heart attack Brother   . Heart disease Sister     Review of Systems: The review of systems is positive for knee pain. Her shortness of breath is improved. Weight is down. No abdominal bloating. No PND or orthopnea. No cough.  All other systems were reviewed and are negative.  Physical Exam: BP 128/70  Pulse 66  Wt 172 lb 2 oz (78.075 kg) Weight is down 8 pounds. Patient is an obese, pleasant black female who is in no acute distress. Skin is warm and dry. Color is normal.  HEENT is unremarkable. Normocephalic/atraumatic. PERRL. Sclera are nonicteric. Neck is supple. No masses. No JVD. Lungs are fairly clear. Cardiac exam shows a regular rate and rhythm.She does have a grade 2 systolic murmur.  Abdomen is obese and soft. Extremities are without edema today. Gait and ROM are intact. She is using a cane.  No gross neurologic deficits noted.  LABORATORY DATA:  Pending   Assessment / Plan:

## 2010-08-21 NOTE — Assessment & Plan Note (Signed)
Will need an echo ordered on return. She was felt to be an unlikely candidate for surgical intervention.

## 2010-08-22 ENCOUNTER — Telehealth: Payer: Self-pay | Admitting: Cardiology

## 2010-08-22 DIAGNOSIS — E785 Hyperlipidemia, unspecified: Secondary | ICD-10-CM

## 2010-08-22 DIAGNOSIS — Z79899 Other long term (current) drug therapy: Secondary | ICD-10-CM

## 2010-08-22 NOTE — Telephone Encounter (Signed)
Message copied by Karle Plumber on Wed Aug 22, 2010 10:18 AM ------      Message from: Rosalio Macadamia      Created: Tue Aug 21, 2010  4:23 PM       Ok to report. Lipids show elevated LDL. Is she taking the Zocor or just get started back?      Recheck in 3 months.

## 2010-08-22 NOTE — Telephone Encounter (Signed)
Results reported. Pt informed of need for repeat labs in 3 months. She will call back to schedule pending transportation availability. Pt verbalizes understanding to continue Zocor.

## 2010-10-01 ENCOUNTER — Encounter: Payer: Self-pay | Admitting: Cardiology

## 2010-10-02 ENCOUNTER — Encounter: Payer: Self-pay | Admitting: Nurse Practitioner

## 2010-10-02 ENCOUNTER — Ambulatory Visit (INDEPENDENT_AMBULATORY_CARE_PROVIDER_SITE_OTHER): Payer: Medicaid Other | Admitting: Nurse Practitioner

## 2010-10-02 VITALS — BP 100/60 | HR 56 | Wt 168.2 lb

## 2010-10-02 DIAGNOSIS — I059 Rheumatic mitral valve disease, unspecified: Secondary | ICD-10-CM

## 2010-10-02 DIAGNOSIS — I509 Heart failure, unspecified: Secondary | ICD-10-CM

## 2010-10-02 DIAGNOSIS — I340 Nonrheumatic mitral (valve) insufficiency: Secondary | ICD-10-CM

## 2010-10-02 NOTE — Progress Notes (Signed)
Karina Willis Date of Birth: 11-14-1932   History of Present Illness: Karina Willis is seen back today for a 6 week check. She is seen for Dr. Swaziland. She has CHF with an EF of about 25%. She is here with her daughter. She says she is doing fine. No shortness of breath. No chest pain. No swelling. She ran out of her medicines for a day or so but is now back on them again. She likes salt. Does not have scales. She has some transient dizziness if she gets up too fast. No syncope.   Current Outpatient Prescriptions on File Prior to Visit  Medication Sig Dispense Refill  . amLODipine (NORVASC) 10 MG tablet Take 1 tablet (10 mg total) by mouth daily. For blood pressure  30 tablet  5  . aspirin 325 MG tablet Takes 2 tablets daily per Dr. Swaziland       . furosemide (LASIX) 40 MG tablet Take 1 tablet (40 mg total) by mouth 2 (two) times daily. ( NOT TAKING )  30 tablet  5  . isosorbide mononitrate (IMDUR) 60 MG 24 hr tablet Take 1 tablet (60 mg total) by mouth daily.  30 tablet  5  . metoprolol (LOPRESSOR) 50 MG tablet Take 1 tablet (50 mg total) by mouth 2 (two) times daily.  60 tablet  5  . nitroGLYCERIN (NITROQUICK) 0.4 MG SL tablet Place 1 tablet (0.4 mg total) under the tongue every 5 (five) minutes as needed for chest pain. Takes as needed per cards  25 tablet  11  . ramipril (ALTACE) 10 MG capsule Take 1 capsule (10 mg total) by mouth daily.  30 capsule  5  . simvastatin (ZOCOR) 40 MG tablet Take 1 tablet (40 mg total) by mouth at bedtime.  30 tablet  5  . spironolactone (ALDACTONE) 25 MG tablet Take 1 tablet (25 mg total) by mouth daily.  30 tablet  5  . cyanocobalamin ([employer] VITAMIN B12) 2000 MCG tablet Take 2,000 mcg by mouth daily.        . ergocalciferol (VITAMIN D2) 50000 UNIT capsule Take 50,000 Units by mouth once a week. For 8 weeks for low vitamin D levels.         Allergies  Allergen Reactions  . Penicillins Hives and Itching    Past Medical History  Diagnosis Date  . CHF (congestive  heart failure)   . Systolic dysfunction     CHRONIC, EF 25-30%  . Mitral insufficiency     SEVERE  . Coronary artery disease 2001    WITH DOCUMENTED TOTAL OCCLUSION OF THE RIGHT CORONARY AND THE LEFT CIRCUMFLEX CORONARY  . SOB (shortness of breath)   . Hypertension   . Hyperlipidemia   . History of TIAs   . Arthritis   . Non compliance w medication regimen     Past Surgical History  Procedure Date  . Cardiac catheterization 06/05/1999    ENLARGED LEFT VENTRICULAR SIZE. THERE IS AKINESIA OF THE INFERIOR BASE. LEFT VENTRICULAR  FUNCTIONS APPEAR TO BE MODERATELY REDUCED WITH EF 35-40%  . Appendectomy   . Left arm surgery     History  Smoking status  . Former Smoker  . Types: Cigarettes  . Quit date: 08/21/1983  Smokeless tobacco  . Never Used    History  Alcohol Use No    Family History  Problem Relation Age of Onset  . Heart attack Mother   . Heart attack Father   . Heart disease Sister   .  Heart attack Brother   . Heart attack Brother   . Heart attack Brother   . Heart disease Sister     Review of Systems: The review of systems is positive for pain in her right knee. No shortness of breath, PND or orthopnea.  All other systems were reviewed and are negative.  Physical Exam: BP 100/60  Pulse 56  Wt 168 lb 3.2 oz (76.295 kg) Patient is a pleasant black female who is in no acute distress. Skin is warm and dry. Color is normal.  HEENT is unremarkable. Normocephalic/atraumatic. PERRL. Sclera are nonicteric. Neck is supple. No masses. No JVD. Lungs are clear. Cardiac exam shows a regular rate and rhythm. She has a 2/6 murmur. Abdomen is obese and soft. Extremities are without edema. Gait and ROM are intact. No gross neurologic deficits noted. She is using a cane.   LABORATORY DATA:   Assessment / Plan:

## 2010-10-02 NOTE — Patient Instructions (Signed)
Continue with your current medicines. Try to not run out of your medicines Watch your salt We will see you back in about 4 months.

## 2010-10-02 NOTE — Assessment & Plan Note (Signed)
She seems compensated. She is not interested in having any testing. She is on a satisfactory regimen that includes ACE/diuretic/aldactone/beta blocker. Blood pressure is not going to allow for further titration of her medicines. We will see her back in about 4 months. I reminded her of the importance of staying on track with salt restriction and taking her medicines. Patient is agreeable to this plan and will call if any problems develop in the interim.

## 2010-10-02 NOTE — Assessment & Plan Note (Signed)
She has known mitral insuffiencey. She does not want a repeat echo. She is not felt to be a candidate for surgical intervention.

## 2010-10-03 ENCOUNTER — Ambulatory Visit: Payer: Medicaid Other | Admitting: Nurse Practitioner

## 2010-11-08 ENCOUNTER — Telehealth: Payer: Self-pay | Admitting: Cardiology

## 2010-11-08 NOTE — Telephone Encounter (Signed)
[facility] called wants last ekg, stress test, ov faxed to [phone]

## 2010-11-09 NOTE — Telephone Encounter (Signed)
Faxed over Last OV, EKG, and Echo.  No Stress Available.  Other info faxed today.

## 2010-12-18 LAB — I-STAT 8, (EC8 V) (CONVERTED LAB)
Acid-base deficit: 1
Bicarbonate: 25.6 — ABNORMAL HIGH
HCT: 48 — ABNORMAL HIGH
Operator id: 198171
pCO2, Ven: 49.1

## 2010-12-18 LAB — POCT CARDIAC MARKERS
CKMB, poc: 1.7
Myoglobin, poc: 173

## 2010-12-18 LAB — POCT I-STAT CREATININE
Creatinine, Ser: 1.2
Operator id: 198171

## 2011-01-29 ENCOUNTER — Ambulatory Visit: Payer: Medicaid Other | Admitting: Cardiology

## 2011-02-01 ENCOUNTER — Other Ambulatory Visit: Payer: Self-pay | Admitting: *Deleted

## 2011-02-01 MED ORDER — RAMIPRIL 10 MG PO CAPS: 10.0000 mg | ORAL_CAPSULE | Freq: Every day | ORAL | Status: DC

## 2011-02-04 ENCOUNTER — Ambulatory Visit (INDEPENDENT_AMBULATORY_CARE_PROVIDER_SITE_OTHER): Payer: Medicare Other | Admitting: Cardiology

## 2011-02-04 ENCOUNTER — Encounter: Payer: Self-pay | Admitting: Cardiology

## 2011-02-04 VITALS — BP 121/62 | HR 61 | Ht 59.0 in | Wt 171.4 lb

## 2011-02-04 DIAGNOSIS — I059 Rheumatic mitral valve disease, unspecified: Secondary | ICD-10-CM

## 2011-02-04 DIAGNOSIS — I5022 Chronic systolic (congestive) heart failure: Secondary | ICD-10-CM

## 2011-02-04 DIAGNOSIS — I10 Essential (primary) hypertension: Secondary | ICD-10-CM

## 2011-02-04 DIAGNOSIS — I509 Heart failure, unspecified: Secondary | ICD-10-CM

## 2011-02-04 DIAGNOSIS — I340 Nonrheumatic mitral (valve) insufficiency: Secondary | ICD-10-CM

## 2011-02-04 DIAGNOSIS — I2510 Atherosclerotic heart disease of native coronary artery without angina pectoris: Secondary | ICD-10-CM

## 2011-02-04 MED ORDER — METOPROLOL TARTRATE 50 MG PO TABS: 50.0000 mg | ORAL_TABLET | Freq: Two times a day (BID) | ORAL | Status: DC

## 2011-02-04 MED ORDER — RAMIPRIL 10 MG PO CAPS: 10.0000 mg | ORAL_CAPSULE | Freq: Every day | ORAL | Status: DC

## 2011-02-04 MED ORDER — AMLODIPINE BESYLATE 10 MG PO TABS: 10.0000 mg | ORAL_TABLET | Freq: Every day | ORAL | Status: DC

## 2011-02-04 NOTE — Patient Instructions (Signed)
We will refill all your medications today.  We will see you again in 3 months and check fasting lab work then.  Avoid eating salt.

## 2011-02-04 NOTE — Assessment & Plan Note (Signed)
We will schedule fasting lab work on her next visit in 3 months. She has already eaten today.

## 2011-02-04 NOTE — Assessment & Plan Note (Signed)
She has no anginal symptoms currently. We will continue with medical therapy. She is a poor candidate for revascularization.

## 2011-02-04 NOTE — Assessment & Plan Note (Signed)
She appears to be well compensated today. She ran out of her medicines 2 days ago. We will refill all her medicines today. I stressed the importance of taking her medications as prescribed. She needs to avoid sodium intake. Although her weight has increased slightly from her last visit there is no edema on exam. I'll see her back again in 3 months.

## 2011-02-04 NOTE — Progress Notes (Signed)
Karina Willis Date of Birth: 05-31-1932   History of Present Illness: Karina Willis is seen back today for a follow up visit.  She has CHF with an EF of about 25%. She is here with her daughter. She says she is doing fine. No shortness of breath. No chest pain. No swelling. She ran out of her medicines for 2 days due to a mix up with the pharmacy. She likes salt. Does not have scales. She has some transient dizziness if she gets up too fast. No syncope.   Current Outpatient Prescriptions on File Prior to Visit  Medication Sig Dispense Refill  . aspirin 325 MG tablet Takes 2 tablets daily per Dr. Swaziland       . cyanocobalamin ([employer]S VITAMIN B12) 2000 MCG tablet Take 2,000 mcg by mouth daily.        . metoprolol (LOPRESSOR) 50 MG tablet Take 1 tablet (50 mg total) by mouth 2 (two) times daily.  60 tablet  5  . nitroGLYCERIN (NITROQUICK) 0.4 MG SL tablet Place 1 tablet (0.4 mg total) under the tongue every 5 (five) minutes as needed for chest pain. Takes as needed per cards  25 tablet  11  . amLODipine (NORVASC) 10 MG tablet Take 1 tablet (10 mg total) by mouth daily. For blood pressure  30 tablet  5  . ergocalciferol (VITAMIN D2) 50000 UNIT capsule Take 50,000 Units by mouth once a week. For 8 weeks for low vitamin D levels.       . furosemide (LASIX) 40 MG tablet Take 1 tablet (40 mg total) by mouth 2 (two) times daily. ( NOT TAKING )  30 tablet  5  . isosorbide mononitrate (IMDUR) 60 MG 24 hr tablet Take 1 tablet (60 mg total) by mouth daily.  30 tablet  5  . ramipril (ALTACE) 10 MG capsule Take 1 capsule (10 mg total) by mouth daily.  30 capsule  5  . simvastatin (ZOCOR) 40 MG tablet Take 1 tablet (40 mg total) by mouth at bedtime.  30 tablet  5    Allergies  Allergen Reactions  . Penicillins Hives and Itching    Past Medical History  Diagnosis Date  . CHF (congestive heart failure)   . Systolic dysfunction     CHRONIC, EF 25-30%  . Mitral insufficiency     SEVERE  . Coronary artery  disease 2001    WITH DOCUMENTED TOTAL OCCLUSION OF THE RIGHT CORONARY AND THE LEFT CIRCUMFLEX CORONARY  . SOB (shortness of breath)   . Hypertension   . Hyperlipidemia   . History of TIAs   . Arthritis   . Non compliance w medication regimen   . Neck pain     Past Surgical History  Procedure Date  . Cardiac catheterization 06/05/1999    ENLARGED LEFT VENTRICULAR SIZE. THERE IS AKINESIA OF THE INFERIOR BASE. LEFT VENTRICULAR  FUNCTIONS APPEAR TO BE MODERATELY REDUCED WITH EF 35-40%  . Appendectomy   . Left arm surgery     History  Smoking status  . Former Smoker  . Types: Cigarettes  . Quit date: 08/21/1983  Smokeless tobacco  . Never Used    History  Alcohol Use No    Family History  Problem Relation Age of Onset  . Heart attack Mother   . Heart attack Father   . Heart disease Sister   . Heart attack Brother   . Heart attack Brother   . Heart attack Brother   . Heart  disease Sister     Review of Systems: The review of systems is positive for pain in her right knee. She has had cataract surgery since her last visit. No shortness of breath, PND or orthopnea.  All other systems were reviewed and are negative.  Physical Exam: BP 121/62  Pulse 61  Ht 4\' 11"  (1.499 m)  Wt 171 lb 6.4 oz (77.747 kg)  BMI 34.62 kg/m2 Patient is a pleasant black female who is in no acute distress. Skin is warm and dry. Color is normal.  HEENT is unremarkable. Normocephalic/atraumatic. PERRL. Sclera are nonicteric. Neck is supple. No masses. No JVD. Lungs are clear. Cardiac exam shows a regular rate and rhythm. She has a 2/6 murmur. Abdomen is obese and soft. Extremities are without edema. Gait and ROM are intact. No gross neurologic deficits noted. She is using a cane.   LABORATORY DATA:   Assessment / Plan:

## 2011-02-05 ENCOUNTER — Telehealth: Payer: Self-pay | Admitting: Cardiology

## 2011-02-05 NOTE — Telephone Encounter (Signed)
Patient called, stated Karina Willis did not have order to refill medication,needs furosemide,amlodipine,isosorbide,simvastatin,spironolactone.Karina Willis was called furosemide 40 mg bid #60 refill 6,amlodipine 10 mg #30 refill 6,isosorbide 60 mg #30 refill 6,spironolactone 25 mg #30 refill 6,simvastatin 40 mg #30 refill 6. Pharmacist stated medication will be ready in 2 hours. Patient aware.

## 2011-02-05 NOTE — Telephone Encounter (Signed)
Pt was in yesterday but her meds where not called in simvastatin 40mg  qhs, furosemide 40mg  qd, isosorbmono  60mg  qd, prionolact 25mg  qd called into [facility] on [address]

## 2011-02-05 NOTE — Telephone Encounter (Signed)
FU Call: Pt returning call to office to check on status of RX's. Pt is out of medications and need them called into pharmacy ASAP.

## 2011-02-07 ENCOUNTER — Other Ambulatory Visit: Payer: Self-pay | Admitting: *Deleted

## 2011-02-07 DIAGNOSIS — I10 Essential (primary) hypertension: Secondary | ICD-10-CM

## 2011-02-07 DIAGNOSIS — E7800 Pure hypercholesterolemia, unspecified: Secondary | ICD-10-CM

## 2011-02-07 DIAGNOSIS — I509 Heart failure, unspecified: Secondary | ICD-10-CM

## 2011-02-07 DIAGNOSIS — I340 Nonrheumatic mitral (valve) insufficiency: Secondary | ICD-10-CM

## 2011-02-07 DIAGNOSIS — I2510 Atherosclerotic heart disease of native coronary artery without angina pectoris: Secondary | ICD-10-CM

## 2011-02-07 DIAGNOSIS — I5022 Chronic systolic (congestive) heart failure: Secondary | ICD-10-CM

## 2011-02-07 MED ORDER — FUROSEMIDE 40 MG PO TABS: 40.0000 mg | ORAL_TABLET | Freq: Every day | ORAL | Status: DC

## 2011-02-07 MED ORDER — ISOSORBIDE MONONITRATE ER 60 MG PO TB24: 60.0000 mg | ORAL_TABLET | Freq: Every day | ORAL | Status: DC

## 2011-02-07 MED ORDER — PRAVASTATIN SODIUM 40 MG PO TABS: 40.0000 mg | ORAL_TABLET | Freq: Every day | ORAL | Status: DC

## 2011-02-07 MED ORDER — RAMIPRIL 10 MG PO CAPS: 10.0000 mg | ORAL_CAPSULE | Freq: Every day | ORAL | Status: DC

## 2011-02-13 ENCOUNTER — Telehealth: Payer: Self-pay | Admitting: Cardiology

## 2011-02-13 ENCOUNTER — Other Ambulatory Visit: Payer: Self-pay | Admitting: *Deleted

## 2011-02-13 DIAGNOSIS — E7800 Pure hypercholesterolemia, unspecified: Secondary | ICD-10-CM

## 2011-02-13 DIAGNOSIS — I10 Essential (primary) hypertension: Secondary | ICD-10-CM

## 2011-02-13 NOTE — Telephone Encounter (Signed)
Pt was told to stop one of her meds, but forgot which one, pls call [phone]

## 2011-02-13 NOTE — Telephone Encounter (Signed)
Called to clarify medication that was stopped. Advised Simvastatin was changed to Pravastatin but she should continue all of other medications.

## 2011-03-21 ENCOUNTER — Other Ambulatory Visit: Payer: Medicare Other | Admitting: *Deleted

## 2011-04-08 ENCOUNTER — Telehealth: Payer: Self-pay | Admitting: Cardiology

## 2011-04-08 NOTE — Telephone Encounter (Signed)
New PRoblem:     Called because the patient is listed as being on two different medications(pravastatin (PRAVACHOL) 40 MG tablet, and symvastatin) and they would like to know which one she is supposed to take. Please call back.

## 2011-04-08 NOTE — Telephone Encounter (Signed)
Karina Willis at [facility] called was told patient is taking simvastatin 40 mg every night.

## 2011-05-07 ENCOUNTER — Ambulatory Visit (INDEPENDENT_AMBULATORY_CARE_PROVIDER_SITE_OTHER): Payer: Medicare Other | Admitting: Cardiology

## 2011-05-07 ENCOUNTER — Other Ambulatory Visit: Payer: Medicare Other | Admitting: *Deleted

## 2011-05-07 ENCOUNTER — Other Ambulatory Visit (INDEPENDENT_AMBULATORY_CARE_PROVIDER_SITE_OTHER): Payer: Medicare Other

## 2011-05-07 ENCOUNTER — Encounter: Payer: Self-pay | Admitting: Cardiology

## 2011-05-07 VITALS — BP 104/56 | HR 70 | Wt 162.6 lb

## 2011-05-07 DIAGNOSIS — I509 Heart failure, unspecified: Secondary | ICD-10-CM

## 2011-05-07 DIAGNOSIS — I340 Nonrheumatic mitral (valve) insufficiency: Secondary | ICD-10-CM

## 2011-05-07 DIAGNOSIS — I059 Rheumatic mitral valve disease, unspecified: Secondary | ICD-10-CM

## 2011-05-07 DIAGNOSIS — R0602 Shortness of breath: Secondary | ICD-10-CM

## 2011-05-07 DIAGNOSIS — E7800 Pure hypercholesterolemia, unspecified: Secondary | ICD-10-CM

## 2011-05-07 DIAGNOSIS — I10 Essential (primary) hypertension: Secondary | ICD-10-CM

## 2011-05-07 DIAGNOSIS — I2510 Atherosclerotic heart disease of native coronary artery without angina pectoris: Secondary | ICD-10-CM

## 2011-05-07 LAB — LIPID PANEL
Cholesterol: 180 mg/dL (ref 0–200)
HDL: 56.1 mg/dL (ref >39.00)
Triglycerides: 82 mg/dL (ref 0.0–149.0)

## 2011-05-07 LAB — HEPATIC FUNCTION PANEL
ALT: 5 U/L (ref 0–35)
AST: 11 U/L (ref 0–37)
Albumin: 3.9 g/dL (ref 3.5–5.2)
Alkaline Phosphatase: 54 U/L (ref 39–117)
Total Protein: 7.3 g/dL (ref 6.0–8.3)

## 2011-05-07 LAB — BASIC METABOLIC PANEL
CO2: 23 mEq/L (ref 19–32)
Calcium: 9.9 mg/dL (ref 8.4–10.5)
Creatinine, Ser: 1.5 mg/dL — ABNORMAL HIGH (ref 0.4–1.2)
GFR: 44.67 mL/min — ABNORMAL LOW (ref >60.00)
Sodium: 140 mEq/L (ref 135–145)

## 2011-05-07 NOTE — Assessment & Plan Note (Signed)
She has moderate to severe mitral insufficiency related to annular dilatation and mitral valve prolapse. She is not a candidate for mitral valve repair. Continue medical therapy.

## 2011-05-07 NOTE — Patient Instructions (Signed)
Continue your current medications.  We will call with the results of your lab work today.  I will see you again in 4 months.

## 2011-05-07 NOTE — Progress Notes (Signed)
Karina Willis Date of Birth: 1933-02-13   History of Present Illness: Karina Willis is seen back today for a follow up visit.  She has CHF with an EF of about 25%. She has chronic severe mitral insufficiency. She has severe coronary disease with documented occlusion of the left circumflex coronary and right coronary. She is actually doing very well on followup today. She does note that if she tries to push herself she gets short of breath with this is unchanged. She has had no increase in edema. She denies any chest pain. She's had no palpitations or dizziness. Her weight is actually down 8 pounds from last visit.   Current Outpatient Prescriptions on File Prior to Visit  Medication Sig Dispense Refill  . amLODipine (NORVASC) 10 MG tablet Take 1 tablet (10 mg total) by mouth daily. For blood pressure  30 tablet  5  . aspirin 325 MG tablet Takes 2 tablets daily per Dr. Swaziland       . cyanocobalamin ([employer]S VITAMIN B12) 2000 MCG tablet Take 2,000 mcg by mouth daily.        . metoprolol (LOPRESSOR) 50 MG tablet Take 1 tablet (50 mg total) by mouth 2 (two) times daily.  60 tablet  5  . nitroGLYCERIN (NITROQUICK) 0.4 MG SL tablet Place 1 tablet (0.4 mg total) under the tongue every 5 (five) minutes as needed for chest pain. Takes as needed per cards  25 tablet  11  . ramipril (ALTACE) 10 MG capsule Take 1 capsule (10 mg total) by mouth daily.  30 capsule  5  . DISCONTD: furosemide (LASIX) 40 MG tablet Take 1 tablet (40 mg total) by mouth daily. ( NOT TAKING )  30 tablet  5    Allergies  Allergen Reactions  . Penicillins Hives and Itching    Past Medical History  Diagnosis Date  . CHF (congestive heart failure)   . Systolic dysfunction     CHRONIC, EF 25-30%  . Mitral insufficiency     SEVERE  . Coronary artery disease 2001    WITH DOCUMENTED TOTAL OCCLUSION OF THE RIGHT CORONARY AND THE LEFT CIRCUMFLEX CORONARY  . SOB (shortness of breath)   . Hypertension   . Hyperlipidemia   . History of  TIAs   . Arthritis   . Non compliance w medication regimen   . Neck pain     Past Surgical History  Procedure Date  . Cardiac catheterization 06/05/1999    ENLARGED LEFT VENTRICULAR SIZE. THERE IS AKINESIA OF THE INFERIOR BASE. LEFT VENTRICULAR  FUNCTIONS APPEAR TO BE MODERATELY REDUCED WITH EF 35-40%  . Appendectomy   . Left arm surgery     History  Smoking status  . Former Smoker  . Types: Cigarettes  . Quit date: 08/21/1983  Smokeless tobacco  . Never Used    History  Alcohol Use No    Family History  Problem Relation Age of Onset  . Heart attack Mother   . Heart attack Father   . Heart disease Sister   . Heart attack Brother   . Heart attack Brother   . Heart attack Brother   . Heart disease Sister     Review of Systems: As noted in history of present illness All other systems were reviewed and are negative.  Physical Exam: BP 104/56  Pulse 70  Wt 162 lb 9.6 oz (73.755 kg) Patient is a pleasant black female who is in no acute distress. Skin is warm and dry.  Color is normal.  HEENT is unremarkable. Normocephalic/atraumatic. PERRL. Sclera are nonicteric. Neck is supple. No masses. No JVD. Lungs are clear. Cardiac exam shows a regular rate and rhythm. She has a 2/6 murmur. Abdomen is obese and soft. Extremities are without edema. Gait and ROM are intact. No gross neurologic deficits noted. She is using a cane.   LABORATORY DATA: ECG today demonstrates normal sinus rhythm with occasional PVCs. She has delayed R wave progression in the anterior precordium consistent with possible old anterior infarction.  Assessment / Plan:

## 2011-05-07 NOTE — Assessment & Plan Note (Signed)
She has no anginal symptoms. She is a poor candidate for revascularization. Continue medical therapy.

## 2011-05-07 NOTE — Assessment & Plan Note (Signed)
She appears to be well compensated on her current medical therapy. We will obtain fasting lab work today including chemistries and a BNP level. She's instructed on sodium restriction. Continue to monitor her weight.

## 2011-09-04 ENCOUNTER — Ambulatory Visit: Payer: Medicare Other | Admitting: Cardiology

## 2011-09-26 ENCOUNTER — Other Ambulatory Visit: Payer: Self-pay | Admitting: Cardiology

## 2011-09-26 MED ORDER — NITROGLYCERIN 0.4 MG SL SUBL: 0.4000 mg | SUBLINGUAL_TABLET | SUBLINGUAL | Status: DC | PRN

## 2011-09-26 NOTE — Telephone Encounter (Signed)
Fax Received. Refill Completed. Maribella Kuna Chowoe (R.M.A)   

## 2011-10-10 ENCOUNTER — Other Ambulatory Visit: Payer: Self-pay | Admitting: Cardiology

## 2011-10-10 DIAGNOSIS — I10 Essential (primary) hypertension: Secondary | ICD-10-CM

## 2011-10-10 DIAGNOSIS — I340 Nonrheumatic mitral (valve) insufficiency: Secondary | ICD-10-CM

## 2011-10-10 DIAGNOSIS — I509 Heart failure, unspecified: Secondary | ICD-10-CM

## 2011-10-10 DIAGNOSIS — I2510 Atherosclerotic heart disease of native coronary artery without angina pectoris: Secondary | ICD-10-CM

## 2011-10-10 DIAGNOSIS — I5022 Chronic systolic (congestive) heart failure: Secondary | ICD-10-CM

## 2011-10-10 MED ORDER — SPIRONOLACTONE 25 MG PO TABS: 25.0000 mg | ORAL_TABLET | Freq: Every day | ORAL | Status: DC

## 2011-10-10 MED ORDER — AMLODIPINE BESYLATE 10 MG PO TABS: 10.0000 mg | ORAL_TABLET | Freq: Every day | ORAL | Status: DC

## 2011-10-10 MED ORDER — METOPROLOL TARTRATE 50 MG PO TABS: 50.0000 mg | ORAL_TABLET | Freq: Two times a day (BID) | ORAL | Status: DC

## 2011-10-10 MED ORDER — SIMVASTATIN 40 MG PO TABS: 40.0000 mg | ORAL_TABLET | Freq: Every evening | ORAL | Status: DC

## 2011-10-10 MED ORDER — RAMIPRIL 10 MG PO CAPS: 10.0000 mg | ORAL_CAPSULE | Freq: Every day | ORAL | Status: DC

## 2011-10-16 ENCOUNTER — Ambulatory Visit: Payer: Medicare Other | Admitting: Cardiology

## 2011-11-13 ENCOUNTER — Ambulatory Visit: Payer: Medicare Other | Admitting: Cardiology

## 2012-03-12 ENCOUNTER — Other Ambulatory Visit: Payer: Self-pay

## 2012-03-12 MED ORDER — SPIRONOLACTONE 25 MG PO TABS: 25.0000 mg | ORAL_TABLET | Freq: Every day | ORAL | Status: DC

## 2012-03-13 ENCOUNTER — Telehealth: Payer: Self-pay | Admitting: Cardiology

## 2012-03-13 DIAGNOSIS — I10 Essential (primary) hypertension: Secondary | ICD-10-CM

## 2012-03-13 DIAGNOSIS — I509 Heart failure, unspecified: Secondary | ICD-10-CM

## 2012-03-13 DIAGNOSIS — I5022 Chronic systolic (congestive) heart failure: Secondary | ICD-10-CM

## 2012-03-13 DIAGNOSIS — I2510 Atherosclerotic heart disease of native coronary artery without angina pectoris: Secondary | ICD-10-CM

## 2012-03-13 DIAGNOSIS — I340 Nonrheumatic mitral (valve) insufficiency: Secondary | ICD-10-CM

## 2012-03-13 MED ORDER — SIMVASTATIN 40 MG PO TABS: 40.0000 mg | ORAL_TABLET | Freq: Every evening | ORAL | Status: DC

## 2012-03-13 MED ORDER — RAMIPRIL 10 MG PO CAPS: 10.0000 mg | ORAL_CAPSULE | Freq: Every day | ORAL | Status: DC

## 2012-03-13 NOTE — Telephone Encounter (Signed)
New Problem:    Patient called in needing refills of her ramipril (ALTACE) 10 MG capsule and simvastatin (ZOCOR) 40 MG tablet.

## 2012-03-17 ENCOUNTER — Other Ambulatory Visit: Payer: Self-pay | Admitting: Cardiology

## 2012-03-17 MED ORDER — SPIRONOLACTONE 25 MG PO TABS: 25.0000 mg | ORAL_TABLET | Freq: Every day | ORAL | Status: DC

## 2012-03-18 ENCOUNTER — Other Ambulatory Visit: Payer: Self-pay | Admitting: *Deleted

## 2012-03-18 NOTE — Telephone Encounter (Signed)
Error

## 2012-04-15 ENCOUNTER — Other Ambulatory Visit: Payer: Self-pay

## 2012-04-15 DIAGNOSIS — I10 Essential (primary) hypertension: Secondary | ICD-10-CM

## 2012-04-15 DIAGNOSIS — I5022 Chronic systolic (congestive) heart failure: Secondary | ICD-10-CM

## 2012-04-15 DIAGNOSIS — I340 Nonrheumatic mitral (valve) insufficiency: Secondary | ICD-10-CM

## 2012-04-15 DIAGNOSIS — I509 Heart failure, unspecified: Secondary | ICD-10-CM

## 2012-04-15 DIAGNOSIS — I2510 Atherosclerotic heart disease of native coronary artery without angina pectoris: Secondary | ICD-10-CM

## 2012-04-15 MED ORDER — AMLODIPINE BESYLATE 10 MG PO TABS: 10.0000 mg | ORAL_TABLET | Freq: Every day | ORAL | Status: DC

## 2012-07-23 ENCOUNTER — Other Ambulatory Visit: Payer: Self-pay

## 2012-07-23 DIAGNOSIS — I340 Nonrheumatic mitral (valve) insufficiency: Secondary | ICD-10-CM

## 2012-07-23 DIAGNOSIS — I509 Heart failure, unspecified: Secondary | ICD-10-CM

## 2012-07-23 DIAGNOSIS — I10 Essential (primary) hypertension: Secondary | ICD-10-CM

## 2012-07-23 DIAGNOSIS — I2510 Atherosclerotic heart disease of native coronary artery without angina pectoris: Secondary | ICD-10-CM

## 2012-07-23 DIAGNOSIS — I5022 Chronic systolic (congestive) heart failure: Secondary | ICD-10-CM

## 2012-07-23 MED ORDER — AMLODIPINE BESYLATE 10 MG PO TABS: 10.0000 mg | ORAL_TABLET | Freq: Every day | ORAL | Status: DC

## 2012-10-01 ENCOUNTER — Other Ambulatory Visit: Payer: Self-pay | Admitting: *Deleted

## 2012-10-01 DIAGNOSIS — I340 Nonrheumatic mitral (valve) insufficiency: Secondary | ICD-10-CM

## 2012-10-01 DIAGNOSIS — I5022 Chronic systolic (congestive) heart failure: Secondary | ICD-10-CM

## 2012-10-01 DIAGNOSIS — I10 Essential (primary) hypertension: Secondary | ICD-10-CM

## 2012-10-01 DIAGNOSIS — I2510 Atherosclerotic heart disease of native coronary artery without angina pectoris: Secondary | ICD-10-CM

## 2012-10-01 MED ORDER — AMLODIPINE BESYLATE 10 MG PO TABS: 10.0000 mg | ORAL_TABLET | Freq: Every day | ORAL | Status: DC

## 2012-10-29 ENCOUNTER — Other Ambulatory Visit: Payer: Self-pay

## 2012-10-29 DIAGNOSIS — I340 Nonrheumatic mitral (valve) insufficiency: Secondary | ICD-10-CM

## 2012-10-29 DIAGNOSIS — I2510 Atherosclerotic heart disease of native coronary artery without angina pectoris: Secondary | ICD-10-CM

## 2012-10-29 DIAGNOSIS — I5022 Chronic systolic (congestive) heart failure: Secondary | ICD-10-CM

## 2012-10-29 DIAGNOSIS — I509 Heart failure, unspecified: Secondary | ICD-10-CM

## 2012-10-29 DIAGNOSIS — I10 Essential (primary) hypertension: Secondary | ICD-10-CM

## 2012-10-29 MED ORDER — AMLODIPINE BESYLATE 10 MG PO TABS: 10.0000 mg | ORAL_TABLET | Freq: Every day | ORAL | Status: DC

## 2012-10-29 MED ORDER — RAMIPRIL 10 MG PO CAPS: 10.0000 mg | ORAL_CAPSULE | Freq: Every day | ORAL | Status: DC

## 2012-10-29 MED ORDER — SPIRONOLACTONE 25 MG PO TABS: 25.0000 mg | ORAL_TABLET | Freq: Every day | ORAL | Status: DC

## 2012-10-29 MED ORDER — SIMVASTATIN 40 MG PO TABS: 40.0000 mg | ORAL_TABLET | Freq: Every evening | ORAL | Status: DC

## 2012-10-29 MED ORDER — METOPROLOL TARTRATE 50 MG PO TABS: 50.0000 mg | ORAL_TABLET | Freq: Two times a day (BID) | ORAL | Status: DC

## 2012-10-29 MED ORDER — FUROSEMIDE 40 MG PO TABS: 40.0000 mg | ORAL_TABLET | Freq: Every day | ORAL | Status: DC

## 2012-11-18 ENCOUNTER — Ambulatory Visit (INDEPENDENT_AMBULATORY_CARE_PROVIDER_SITE_OTHER): Payer: Medicare Other | Admitting: Physician Assistant

## 2012-11-18 ENCOUNTER — Encounter: Payer: Self-pay | Admitting: Physician Assistant

## 2012-11-18 VITALS — BP 136/75 | HR 67 | Ht 59.0 in | Wt 158.0 lb

## 2012-11-18 DIAGNOSIS — I5022 Chronic systolic (congestive) heart failure: Secondary | ICD-10-CM

## 2012-11-18 DIAGNOSIS — I2510 Atherosclerotic heart disease of native coronary artery without angina pectoris: Secondary | ICD-10-CM

## 2012-11-18 DIAGNOSIS — I10 Essential (primary) hypertension: Secondary | ICD-10-CM

## 2012-11-18 DIAGNOSIS — I340 Nonrheumatic mitral (valve) insufficiency: Secondary | ICD-10-CM

## 2012-11-18 DIAGNOSIS — I509 Heart failure, unspecified: Secondary | ICD-10-CM

## 2012-11-18 DIAGNOSIS — E7800 Pure hypercholesterolemia, unspecified: Secondary | ICD-10-CM

## 2012-11-18 DIAGNOSIS — I059 Rheumatic mitral valve disease, unspecified: Secondary | ICD-10-CM

## 2012-11-18 LAB — BASIC METABOLIC PANEL
BUN: 22 mg/dL (ref 6–23)
CO2: 26 mEq/L (ref 19–32)
Chloride: 99 mEq/L (ref 96–112)
Creatinine, Ser: 1.3 mg/dL — ABNORMAL HIGH (ref 0.4–1.2)
Glucose, Bld: 104 mg/dL — ABNORMAL HIGH (ref 70–99)

## 2012-11-18 LAB — CBC WITH DIFFERENTIAL/PLATELET
Eosinophils Absolute: 0.1 10*3/uL (ref 0.0–0.7)
HCT: 36.7 % (ref 36.0–46.0)
Lymphs Abs: 1.1 10*3/uL (ref 0.7–4.0)
MCHC: 32.7 g/dL (ref 30.0–36.0)
MCV: 99.5 fl (ref 78.0–100.0)
Monocytes Absolute: 0.6 10*3/uL (ref 0.1–1.0)
Neutrophils Relative %: 79.4 % — ABNORMAL HIGH (ref 43.0–77.0)
Platelets: 200 10*3/uL (ref 150.0–400.0)

## 2012-11-18 LAB — LIPID PANEL
Cholesterol: 170 mg/dL (ref 0–200)
HDL: 58.4 mg/dL (ref >39.00)
LDL Cholesterol: 99 mg/dL (ref 0–99)
Total CHOL/HDL Ratio: 3
Triglycerides: 62 mg/dL (ref 0.0–149.0)

## 2012-11-18 LAB — HEPATIC FUNCTION PANEL
Bilirubin, Direct: 0 mg/dL (ref 0.0–0.3)
Total Bilirubin: 0.3 mg/dL (ref 0.3–1.2)
Total Protein: 7.8 g/dL (ref 6.0–8.3)

## 2012-11-18 MED ORDER — FUROSEMIDE 40 MG PO TABS: 40.0000 mg | ORAL_TABLET | Freq: Every day | ORAL | Status: DC

## 2012-11-18 MED ORDER — AMLODIPINE BESYLATE 10 MG PO TABS: 10.0000 mg | ORAL_TABLET | Freq: Every day | ORAL | Status: DC

## 2012-11-18 MED ORDER — METOPROLOL TARTRATE 50 MG PO TABS: 50.0000 mg | ORAL_TABLET | Freq: Two times a day (BID) | ORAL | Status: DC

## 2012-11-18 MED ORDER — NITROGLYCERIN 0.4 MG SL SUBL: 0.4000 mg | SUBLINGUAL_TABLET | SUBLINGUAL | Status: DC | PRN

## 2012-11-18 MED ORDER — RAMIPRIL 10 MG PO CAPS: 10.0000 mg | ORAL_CAPSULE | Freq: Every day | ORAL | Status: DC

## 2012-11-18 MED ORDER — SIMVASTATIN 40 MG PO TABS: 20.0000 mg | ORAL_TABLET | Freq: Every day | ORAL | Status: DC

## 2012-11-18 MED ORDER — SPIRONOLACTONE 25 MG PO TABS: 25.0000 mg | ORAL_TABLET | Freq: Every day | ORAL | Status: DC

## 2012-11-18 NOTE — Progress Notes (Signed)
[address] [place], [place]  [address] Phone: [phone] Fax:  [phone]  Date:  11/18/2012   ID:  Karina Willis, DOB 1932/07/04, MRN [id number]  PCP:  No primary provider on file.  Cardiologist:  Dr. Peter Swaziland     History of Present Illness: Karina Willis is a 79 y.o. female who returns for f/u.    She has a hx of CAD, ischemic cardiomyopathy, chronic systolic CHF, moderate to severe MR, HTN, HL, prior TIA. LHC 3/01: Mid LAD 50%, ostial circumflex 80%, then 99% prior to OM1, then occluded; proximal RCA occluded, EF 35-40% with inferior AK. EF has been reportedly as low as 25-30%. I cannot find a report of her last echocardiogram in the chart. She has refused follow up echos in the past.  Last seen by Dr. Swaziland 04/2011. She is felt to be a poor candidate for revascularization as well as mitral valve surgery. She has been treated medically.  She is doing well.  She notes dyspnea with more moderate activities.  She describes NYHA class IIb symptoms.  No CP, syncope, near syncope, orthopnea, PND, edema.  Labs (6/12):   Hgb 12.3 Labs (2/13):   K 4.7, creatinine 1.5, ALT 5, HDL 56, LDL 108  Wt Readings from Last 3 Encounters:  11/18/12 158 lb (71.668 kg)  05/07/11 162 lb 9.6 oz (73.755 kg)  02/04/11 171 lb 6.4 oz (77.747 kg)     Past Medical History  Diagnosis Date  . CHF (congestive heart failure)   . Systolic dysfunction     CHRONIC, EF 25-30%  . Mitral insufficiency     SEVERE  . Coronary artery disease 2001    WITH DOCUMENTED TOTAL OCCLUSION OF THE RIGHT CORONARY AND THE LEFT CIRCUMFLEX CORONARY  . SOB (shortness of breath)   . Hypertension   . Hyperlipidemia   . History of TIAs   . Arthritis   . Non compliance w medication regimen   . Neck pain     Current Outpatient Prescriptions  Medication Sig Dispense Refill  . amLODipine (NORVASC) 10 MG tablet Take 1 tablet (10 mg total) by mouth daily. For blood pressure. MUST SCHEDULE FOLLOW UP FOR MORE  REFILLS  30 tablet  0  . aspirin 325 MG tablet Takes 2 tablets daily per Dr. Swaziland       . cyanocobalamin ([employer]S VITAMIN B12) 2000 MCG tablet Take 2,000 mcg by mouth daily.        . furosemide (LASIX) 40 MG tablet Take 1 tablet (40 mg total) by mouth daily.  30 tablet  0  . metoprolol (LOPRESSOR) 50 MG tablet Take 1 tablet (50 mg total) by mouth 2 (two) times daily.  60 tablet  0  . nitroGLYCERIN (NITROSTAT) 0.4 MG SL tablet Place 1 tablet (0.4 mg total) under the tongue every 5 (five) minutes as needed for chest pain. Takes as needed per cards  25 tablet  6  . ramipril (ALTACE) 10 MG capsule Take 1 capsule (10 mg total) by mouth daily.  30 capsule  0  . simvastatin (ZOCOR) 40 MG tablet Take 1 tablet (40 mg total) by mouth every evening.  30 tablet  0  . spironolactone (ALDACTONE) 25 MG tablet Take 1 tablet (25 mg total) by mouth daily.  30 tablet  1   No current facility-administered medications for this visit.    Allergies:    Allergies  Allergen Reactions  . Penicillins Hives and Itching  Social History:  The patient  reports that she quit smoking about 29 years ago. Her smoking use included Cigarettes. She smoked 0.00 packs per day. She has never used smokeless tobacco. She reports that she does not drink alcohol or use illicit drugs.   ROS:  Please see the history of present illness.   No melena, hematochezia, fevers, cough.   All other systems reviewed and negative.   PHYSICAL EXAM: VS:  BP 136/75  Pulse 67  Ht 4\' 11"  (1.499 m)  Wt 158 lb (71.668 kg)  BMI 31.89 kg/m2 Well nourished, well developed, in no acute distress HEENT: normal Neck: no JVD at 90 Cardiac:  normal S1, S2; RRR; 2-3/6 holosystolic murmur at the apex Lungs:  clear to auscultation bilaterally, no wheezing, rhonchi or rales Abd: soft, nontender  Ext: no edema Skin: warm and dry Neuro:  CNs 2-12 intact, no focal abnormalities noted  EKG:  NSR, HR 67, low-voltage, no change from prior tracing      ASSESSMENT AND PLAN:  1. CAD:  Stable. No angina. Continue aspirin and statin. 2. Chronic Systolic CHF:  Volume stable. Continue current dose of Lasix, beta blocker and ACE inhibitor as well as spironolactone. Check a basic metabolic panel and CBC today. 3. Mitral Regurgitation:  She is not felt to be a surgical candidate. Overall stable. Volume stable. 4. Hypertension:  Controlled. Check basic metabolic panel today. 5. Hyperlipidemia:  Continue statin. As she is on amlodipine, I will reduce her simvastatin to 20 mg each bedtime. Check lipids and LFTs today. 6. Disposition:  Follow up with Dr. Swaziland in 6 months or sooner as needed.  Signed, Tereso Newcomer, PA-C  11/18/2012 10:26 AM

## 2012-11-18 NOTE — Patient Instructions (Addendum)
Your physician has recommended you make the following change in your medication:   DECREASE YOUR SIMVASTATIN 20 MG ONCE A DAY  Your physician recommends that you return for lab work in: TODAY (BMET,CBC,LFT, LIPIDS)  Your physician wants you to follow-up in: 6 MONTHS WITH DR. Swaziland You will receive a reminder letter in the mail two months in advance. If you don't receive a letter, please call our office to schedule the follow-up appointment.  MEDICATIONS WERE ALL REFILLED TODAY  Your physician recommends that you continue on your current medications as directed. Please refer to the Current Medication list given to you today.

## 2012-11-20 ENCOUNTER — Telehealth: Payer: Self-pay | Admitting: *Deleted

## 2012-11-20 NOTE — Telephone Encounter (Signed)
lmptcb go over lab results 

## 2012-11-20 NOTE — Telephone Encounter (Signed)
pt notified about lab results with verbal understanding  

## 2013-01-28 ENCOUNTER — Other Ambulatory Visit: Payer: Self-pay | Admitting: Physician Assistant

## 2013-05-28 ENCOUNTER — Other Ambulatory Visit: Payer: Self-pay | Admitting: Physician Assistant

## 2013-06-30 ENCOUNTER — Other Ambulatory Visit: Payer: Self-pay | Admitting: Cardiology

## 2013-07-15 ENCOUNTER — Encounter: Payer: Self-pay | Admitting: Podiatry

## 2013-07-15 ENCOUNTER — Ambulatory Visit (INDEPENDENT_AMBULATORY_CARE_PROVIDER_SITE_OTHER): Payer: Medicare Other | Admitting: Podiatry

## 2013-07-15 VITALS — BP 155/76 | HR 50 | Resp 16

## 2013-07-15 DIAGNOSIS — B351 Tinea unguium: Secondary | ICD-10-CM

## 2013-07-15 DIAGNOSIS — M79609 Pain in unspecified limb: Secondary | ICD-10-CM

## 2013-07-15 NOTE — Progress Notes (Signed)
   Subjective:    Patient ID: Karina Willis, female    DOB: 01/23/1933, 80 y.o.   MRN: [id number]  HPI Comments: "I need the toenails cut"  Patient c/o painful toenails bilateral for several months. They are extremely long and thick.      Review of Systems  Musculoskeletal: Positive for arthralgias and gait problem.  Hematological: Bruises/bleeds easily.  All other systems reviewed and are negative.      Objective:   Physical Exam        Assessment & Plan:

## 2013-07-16 NOTE — Progress Notes (Signed)
Subjective:     Patient ID: Karina Willis, female   DOB: 05/10/1932, 80 y.o.   MRN: [id number]  HPI patient presents with caregiver with severe elongation of nailbeds 1-5 both feet that are at least 1 inch long yellow thick and very painful   Review of Systems  All other systems reviewed and are negative.      Objective:   Physical Exam  Nursing note and vitals reviewed. Constitutional: She is oriented to person, place, and time.  Cardiovascular: Intact distal pulses.   Musculoskeletal: Normal range of motion.  Neurological: She is oriented to person, place, and time.  Skin: Skin is dry.   neurovascular status intact with severe elongation of nailbeds that are very painful yellow brittle and complete noncompliance in patient cutting her own nails     Assessment:     Severe mycotic nail infection 1-5 both feet    Plan:     Debridement painful nail bed 1-5 both feet with no bleeding noted

## 2013-07-30 ENCOUNTER — Other Ambulatory Visit: Payer: Self-pay | Admitting: Cardiology

## 2013-07-30 ENCOUNTER — Other Ambulatory Visit: Payer: Self-pay | Admitting: Physician Assistant

## 2013-08-01 ENCOUNTER — Inpatient Hospital Stay (HOSPITAL_COMMUNITY)
Admission: EM | Admit: 2013-08-01 | Discharge: 2013-08-04 | DRG: 556 | Disposition: A | Discharge status: Skilled Nursing Facility | Payer: Medicare Other | Attending: Family Medicine | Admitting: Family Medicine

## 2013-08-01 ENCOUNTER — Encounter (HOSPITAL_COMMUNITY): Payer: Self-pay | Admitting: Emergency Medicine

## 2013-08-01 ENCOUNTER — Emergency Department (HOSPITAL_COMMUNITY): Payer: Medicare Other

## 2013-08-01 DIAGNOSIS — M25559 Pain in unspecified hip: Principal | ICD-10-CM | POA: Diagnosis present

## 2013-08-01 DIAGNOSIS — Z8673 Personal history of transient ischemic attack (TIA), and cerebral infarction without residual deficits: Secondary | ICD-10-CM

## 2013-08-01 DIAGNOSIS — M25549 Pain in joints of unspecified hand: Secondary | ICD-10-CM | POA: Diagnosis present

## 2013-08-01 DIAGNOSIS — E669 Obesity, unspecified: Secondary | ICD-10-CM | POA: Diagnosis present

## 2013-08-01 DIAGNOSIS — Z79899 Other long term (current) drug therapy: Secondary | ICD-10-CM

## 2013-08-01 DIAGNOSIS — E785 Hyperlipidemia, unspecified: Secondary | ICD-10-CM | POA: Diagnosis present

## 2013-08-01 DIAGNOSIS — I509 Heart failure, unspecified: Secondary | ICD-10-CM

## 2013-08-01 DIAGNOSIS — I959 Hypotension, unspecified: Secondary | ICD-10-CM | POA: Diagnosis not present

## 2013-08-01 DIAGNOSIS — Z9181 History of falling: Secondary | ICD-10-CM

## 2013-08-01 DIAGNOSIS — N183 Chronic kidney disease, stage 3 unspecified: Secondary | ICD-10-CM | POA: Diagnosis present

## 2013-08-01 DIAGNOSIS — I340 Nonrheumatic mitral (valve) insufficiency: Secondary | ICD-10-CM

## 2013-08-01 DIAGNOSIS — W19XXXA Unspecified fall, initial encounter: Secondary | ICD-10-CM

## 2013-08-01 DIAGNOSIS — I2510 Atherosclerotic heart disease of native coronary artery without angina pectoris: Secondary | ICD-10-CM | POA: Diagnosis present

## 2013-08-01 DIAGNOSIS — Z7982 Long term (current) use of aspirin: Secondary | ICD-10-CM

## 2013-08-01 DIAGNOSIS — D649 Anemia, unspecified: Secondary | ICD-10-CM | POA: Diagnosis present

## 2013-08-01 DIAGNOSIS — I129 Hypertensive chronic kidney disease with stage 1 through stage 4 chronic kidney disease, or unspecified chronic kidney disease: Secondary | ICD-10-CM | POA: Diagnosis present

## 2013-08-01 DIAGNOSIS — Y92009 Unspecified place in unspecified non-institutional (private) residence as the place of occurrence of the external cause: Secondary | ICD-10-CM

## 2013-08-01 DIAGNOSIS — Z87891 Personal history of nicotine dependence: Secondary | ICD-10-CM

## 2013-08-01 DIAGNOSIS — I5022 Chronic systolic (congestive) heart failure: Secondary | ICD-10-CM | POA: Diagnosis present

## 2013-08-01 DIAGNOSIS — M25552 Pain in left hip: Secondary | ICD-10-CM

## 2013-08-01 DIAGNOSIS — M25551 Pain in right hip: Secondary | ICD-10-CM | POA: Diagnosis present

## 2013-08-01 DIAGNOSIS — M1990 Unspecified osteoarthritis, unspecified site: Secondary | ICD-10-CM | POA: Diagnosis present

## 2013-08-01 DIAGNOSIS — E538 Deficiency of other specified B group vitamins: Secondary | ICD-10-CM | POA: Diagnosis present

## 2013-08-01 DIAGNOSIS — D539 Nutritional anemia, unspecified: Secondary | ICD-10-CM | POA: Diagnosis present

## 2013-08-01 DIAGNOSIS — M25539 Pain in unspecified wrist: Secondary | ICD-10-CM | POA: Diagnosis present

## 2013-08-01 DIAGNOSIS — I059 Rheumatic mitral valve disease, unspecified: Secondary | ICD-10-CM | POA: Diagnosis present

## 2013-08-01 LAB — IRON AND TIBC
Iron: 43 ug/dL (ref 42–135)
Saturation Ratios: 15 % — ABNORMAL LOW (ref 20–55)
TIBC: 293 ug/dL (ref 250–470)
UIBC: 250 ug/dL (ref 125–400)

## 2013-08-01 LAB — BASIC METABOLIC PANEL
BUN: 11 mg/dL (ref 6–23)
CHLORIDE: 102 meq/L (ref 96–112)
CO2: 24 mEq/L (ref 19–32)
Calcium: 9.9 mg/dL (ref 8.4–10.5)
Creatinine, Ser: 0.92 mg/dL (ref 0.50–1.10)
GFR calc non Af Amer: 58 mL/min — ABNORMAL LOW (ref >90)
GFR, EST AFRICAN AMERICAN: 67 mL/min — AB (ref >90)
GLUCOSE: 105 mg/dL — AB (ref 70–99)
POTASSIUM: 5 meq/L (ref 3.7–5.3)
Sodium: 138 mEq/L (ref 137–147)

## 2013-08-01 LAB — FERRITIN: Ferritin: 69 ng/mL (ref 10–291)

## 2013-08-01 LAB — CBC WITH DIFFERENTIAL/PLATELET
BASOS PCT: 0 % (ref 0–1)
Basophils Absolute: 0 10*3/uL (ref 0.0–0.1)
Eosinophils Absolute: 0.1 10*3/uL (ref 0.0–0.7)
Eosinophils Relative: 1 % (ref 0–5)
HEMATOCRIT: 34.8 % — AB (ref 36.0–46.0)
HEMOGLOBIN: 11.4 g/dL — AB (ref 12.0–15.0)
LYMPHS ABS: 1.2 10*3/uL (ref 0.7–4.0)
Lymphocytes Relative: 12 % (ref 12–46)
MCH: 33.7 pg (ref 26.0–34.0)
MCHC: 32.8 g/dL (ref 30.0–36.0)
MCV: 103 fL — ABNORMAL HIGH (ref 78.0–100.0)
MONO ABS: 1.2 10*3/uL — AB (ref 0.1–1.0)
MONOS PCT: 12 % (ref 3–12)
NEUTROS ABS: 7.8 10*3/uL — AB (ref 1.7–7.7)
Neutrophils Relative %: 75 % (ref 43–77)
Platelets: 168 10*3/uL (ref 150–400)
RBC: 3.38 MIL/uL — ABNORMAL LOW (ref 3.87–5.11)
RDW: 13 % (ref 11.5–15.5)
WBC: 10.3 10*3/uL (ref 4.0–10.5)

## 2013-08-01 LAB — URINALYSIS, ROUTINE W REFLEX MICROSCOPIC
BILIRUBIN URINE: NEGATIVE
GLUCOSE, UA: NEGATIVE mg/dL
HGB URINE DIPSTICK: NEGATIVE
Ketones, ur: NEGATIVE mg/dL
Leukocytes, UA: NEGATIVE
Nitrite: NEGATIVE
PH: 6.5 (ref 5.0–8.0)
Protein, ur: NEGATIVE mg/dL
SPECIFIC GRAVITY, URINE: 1.011 (ref 1.005–1.030)
UROBILINOGEN UA: 0.2 mg/dL (ref 0.0–1.0)

## 2013-08-01 LAB — RETICULOCYTES
RBC.: 3.42 MIL/uL — ABNORMAL LOW (ref 3.87–5.11)
Retic Count, Absolute: 34.2 10*3/uL (ref 19.0–186.0)
Retic Ct Pct: 1 % (ref 0.4–3.1)

## 2013-08-01 LAB — VITAMIN B12: Vitamin B-12: 335 pg/mL (ref 211–911)

## 2013-08-01 LAB — FOLATE: FOLATE: 5.2 ng/mL

## 2013-08-01 MED ORDER — ACETAMINOPHEN 325 MG PO TABS
650.0000 mg | ORAL_TABLET | Freq: Four times a day (QID) | ORAL | Status: DC | PRN
  Administered 2013-08-01 – 2013-08-04 (×7): 650 mg via ORAL
  Filled 2013-08-01 (×7): qty 2

## 2013-08-01 MED ORDER — ONDANSETRON HCL 4 MG/2ML IJ SOLN: 4.0000 mg | Freq: Three times a day (TID) | INTRAMUSCULAR | Status: AC | PRN

## 2013-08-01 MED ORDER — SIMVASTATIN 20 MG PO TABS
20.0000 mg | ORAL_TABLET | Freq: Every day | ORAL | Status: DC
  Administered 2013-08-02 – 2013-08-03 (×2): 20 mg via ORAL
  Filled 2013-08-01 (×3): qty 1

## 2013-08-01 MED ORDER — SPIRONOLACTONE 25 MG PO TABS
25.0000 mg | ORAL_TABLET | Freq: Once | ORAL | Status: AC
  Administered 2013-08-02: 25 mg via ORAL
  Filled 2013-08-01: qty 1

## 2013-08-01 MED ORDER — ASPIRIN 81 MG PO CHEW
81.0000 mg | CHEWABLE_TABLET | Freq: Every day | ORAL | Status: DC
  Administered 2013-08-02 – 2013-08-04 (×3): 81 mg via ORAL
  Filled 2013-08-01 (×4): qty 1

## 2013-08-01 MED ORDER — METOPROLOL TARTRATE 50 MG PO TABS
50.0000 mg | ORAL_TABLET | Freq: Two times a day (BID) | ORAL | Status: DC
  Administered 2013-08-01 – 2013-08-04 (×5): 50 mg via ORAL
  Filled 2013-08-01 (×8): qty 1

## 2013-08-01 MED ORDER — FUROSEMIDE 40 MG PO TABS
40.0000 mg | ORAL_TABLET | Freq: Every day | ORAL | Status: DC
  Administered 2013-08-02 – 2013-08-04 (×2): 40 mg via ORAL
  Filled 2013-08-01 (×3): qty 1

## 2013-08-01 MED ORDER — AMLODIPINE BESYLATE 10 MG PO TABS
10.0000 mg | ORAL_TABLET | Freq: Every day | ORAL | Status: DC
  Administered 2013-08-02: 10 mg via ORAL
  Filled 2013-08-01 (×2): qty 1

## 2013-08-01 MED ORDER — ACETAMINOPHEN 325 MG PO TABS
650.0000 mg | ORAL_TABLET | Freq: Once | ORAL | Status: AC
  Administered 2013-08-01: 650 mg via ORAL
  Filled 2013-08-01: qty 2

## 2013-08-01 MED ORDER — RAMIPRIL 10 MG PO CAPS
10.0000 mg | ORAL_CAPSULE | Freq: Every day | ORAL | Status: DC
  Administered 2013-08-02 – 2013-08-04 (×2): 10 mg via ORAL
  Filled 2013-08-01 (×3): qty 1

## 2013-08-01 MED ORDER — ENOXAPARIN SODIUM 40 MG/0.4ML ~~LOC~~ SOLN
40.0000 mg | SUBCUTANEOUS | Status: DC
  Administered 2013-08-01 – 2013-08-03 (×3): 40 mg via SUBCUTANEOUS
  Filled 2013-08-01 (×5): qty 0.4

## 2013-08-01 NOTE — ED Notes (Signed)
PATIENTS UNDERWEAR, HOUSECOAT, GOWN, AND RED BAG WITH HER MEDICATIONS IN IT PLACED IN [facility] BED AND TIED TO PT BEDRAIL. PT ENCOURAGED TO HAVE FAMILY TAKE THESE HOME WHEN IT IS DECIDED ABOUT HER ADMISSION.

## 2013-08-01 NOTE — ED Notes (Signed)
SPOKE TO DR Everlene Other (ADMITTING) AND HE ADVISES THE PATIENT DOES NOT NEED IV ACCESS.

## 2013-08-01 NOTE — ED Provider Notes (Signed)
I saw and evaluated the patient, reviewed the resident's note and I agree with the findings and plan.   .Face to face Exam:  General:  Awake HEENT:  Atraumatic Resp:  Normal effort Abd:  Nondistended Neuro:No focal weakness  Nelia Shi, MD 08/01/13 1500

## 2013-08-01 NOTE — ED Notes (Signed)
States walking without her cane when making her coffee. Went to leave the kitchen and fell. She was able to get up without assistance and amblate normally the rest of the day. State this morning she is so sore she cannot bear weight on her legs, also has pain and swelling to her right hand and a bruise to her lower lip

## 2013-08-01 NOTE — ED Provider Notes (Signed)
CSN: [id number]     Arrival date & time 08/01/13  4098 History   First MD Initiated Contact with Patient 08/01/13 1003     Chief Complaint  Patient presents with  . Hip Pain  . Fall   Patient is a 80 y.o. female presenting with hip pain and fall.  Hip Pain Pertinent negatives include no abdominal pain, chest pain, coughing, fever, headaches or rash.  Fall Pertinent negatives include no abdominal pain, chest pain, coughing, fever, headaches or rash.   80 yo female with h/o CHF, CAD, HTN, h/o TIA who presents s/p fall for evaluation of bilateral hip pain and right wrist and hand pain.  Patient reports that she fell yesterday morning. She denies tripping on anything. She is not sure what prompted the fall. She denies any chest pain, palpitations or headache prior to falling. She fell to the ground trying to brace herself on her right arm. She scrambled to the floor and was able to hoist herself back up leaning on furniture. She denies hitting her head or loosing consciousness. Following the fall, her right hand was swollen and tender for which she tried a heating pad. She also noticed a swollen left lower lip. She was otherwise able to walk normally following this incident for the rest of the day until last night when she started feeling increased stiffness and pain in her hips and legs. This morning she was unable to get out of the seated position to walk and her nephew brought her to the ED by ambulance.  She states that the pain is located in the lateral aspect of her hip left more than right, radiates down past her knees. Pain is worst with movement from sitting to standing. No lower back pain.  Wrist pain is worst with attempted movement. Patient is unable to move her wrist and her middle finger.    Past Medical History  Diagnosis Date  . CHF (congestive heart failure)   . Systolic dysfunction     CHRONIC, EF 25-30%  . Mitral insufficiency     SEVERE  . Coronary artery disease 2001   WITH DOCUMENTED TOTAL OCCLUSION OF THE RIGHT CORONARY AND THE LEFT CIRCUMFLEX CORONARY  . SOB (shortness of breath)   . Hypertension   . Hyperlipidemia   . History of TIAs   . Arthritis   . Non compliance w medication regimen   . Neck pain    Past Surgical History  Procedure Laterality Date  . Cardiac catheterization  06/05/1999    ENLARGED LEFT VENTRICULAR SIZE. THERE IS AKINESIA OF THE INFERIOR BASE. LEFT VENTRICULAR  FUNCTIONS APPEAR TO BE MODERATELY REDUCED WITH EF 35-40%  . Appendectomy    . Left arm surgery     Family History  Problem Relation Age of Onset  . Heart attack Mother   . Heart attack Father   . Heart disease Sister   . Heart attack Brother   . Heart attack Brother   . Heart attack Brother   . Heart disease Sister    History  Substance Use Topics  . Smoking status: Former Smoker    Types: Cigarettes    Quit date: 08/21/1983  . Smokeless tobacco: Never Used  . Alcohol Use: No   OB History   Grav Para Term Preterm Abortions TAB SAB Ect Mult Living                 Review of Systems  Constitutional: Negative for fever.  HENT:  Lip swelling  Respiratory: Negative for cough and shortness of breath.   Cardiovascular: Negative for chest pain, palpitations and leg swelling.  Gastrointestinal: Negative for abdominal pain.  Musculoskeletal: Positive for gait problem.       Left and right leg and hip pain Right wrist and hand pain  Skin: Negative for rash.  Neurological: Negative for dizziness, syncope, light-headedness and headaches.  Psychiatric/Behavioral: Negative for confusion.      Allergies  Penicillins  Home Medications   Prior to Admission medications   Medication Sig Start Date End Date Taking? Authorizing Provider  amLODipine (NORVASC) 10 MG tablet TAKE 1 TABLET BY MOUTH EVERY DAY FOR BLOOD PRESSURE 07/30/13   Peter M SwazilandJordan, MD  aspirin 325 MG tablet Takes 2 tablets daily per Dr. SwazilandJordan     Historical Provider, MD  cyanocobalamin  ([employer] VITAMIN B12) 2000 MCG tablet Take 2,000 mcg by mouth daily.      Historical Provider, MD  furosemide (LASIX) 40 MG tablet TAKE 1 TABLET BY MOUTH EVERY DAY 07/30/13   Peter M SwazilandJordan, MD  metoprolol (LOPRESSOR) 50 MG tablet Take 1 tablet (50 mg total) by mouth 2 (two) times daily. 11/18/12   Scott T Weaver, PA-C  NITROSTAT 0.4 MG SL tablet PLACE 1 TABLET UNDER THE TONGUE EVERY 5 MINUTES AS NEEDED FOR CHEST PAIN. TAKE AS NEEDED PER CARDS 07/30/13   Peter M SwazilandJordan, MD  ramipril (ALTACE) 10 MG capsule TAKE ONE CAPSULE BY MOUTH EVERY DAY 07/30/13   Peter M SwazilandJordan, MD  simvastatin (ZOCOR) 40 MG tablet Take 0.5 tablets (20 mg total) by mouth at bedtime. 11/18/12   Beatrice LecherScott T Weaver, PA-C  spironolactone (ALDACTONE) 25 MG tablet TAKE 1 TABLET BY MOUTH DAILY 07/30/13   Peter M SwazilandJordan, MD   BP 133/55  Pulse 69  Temp(Src) 98.6 F (37 C) (Oral)  Resp 16  SpO2 100% Physical Exam Physical Examination: General appearance - alert, well appearing, and in no distress Eyes - pupils equal and reactive, extraocular eye movements intact Nose - normal and patent, no erythema, discharge or polyps Mouth - dental cavities present, oropharynx clear, echymosis of the left lower lip without laceration or bleeding Neck - supple, no significant adenopathy Chest - clear to auscultation, no wheezes, rales or rhonchi, symmetric air entry Heart - S1S2, RRR, 3/6 systolic murmur heard throughout pericardium Abdomen - soft, nontender, nondistended, no masses or organomegaly Musculoskeletal -  - right hand and wrist: limited range of motion of wrist, no point tenderness along anatomical snuffbox.  Tenderness along 3rd MCP joint, soft tissue swelling present, radial pulse on right hand difficult to appreciate compared to left - knee: limited flexion to ~30 deg bilaterally, tenderness to palpation along medial and lateral aspect of joint bilaterally, no obvious soft tissue swelling - hip: no tenderness to palp along lateral thigh b/l,  pain with external and internal rotation bilaterally, left leg appears shorter than right.  Extremities - no edema, dorsalis pedis pulses difficult to palpate Skin - no rash or echymosis  ED Course  Procedures (including critical care time) Labs Review Labs Reviewed  BASIC METABOLIC PANEL  CBC WITH DIFFERENTIAL    Imaging Review Dg Wrist Complete Right  08/01/2013   CLINICAL DATA:  Fall, right wrist and hand pain  EXAM: RIGHT WRIST - COMPLETE 3+ VIEW  COMPARISON:  Concurrently obtained radiographs of the hand  FINDINGS: No acute fracture or malalignment. There is widening of the scapholunate interspace with proximal descends of the capitate consistent with  scapholunate advanced collapse. Well corticated ossific bodies adjacent to the ulnar styloid may reflect the sequelae of remote fracture or degenerative changes. There is marked narrowing of the radiocarpal joint space. Degenerative changes also present at the STT joint and base of the thumb CMC joint. Atherosclerotic vascular calcifications present throughout the visualized radial artery.  IMPRESSION: 1. No definite acute fracture or malalignment. 2. 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 3. Degenerative dystrophic calcifications adjacent to the ulnar and radial styloid. 4. Degenerative osteoarthritis at the STT and the thumb CMC joints. 5. Atherosclerotic vascular calcifications.   Electronically Signed   By: Malachy Moan M.D.   On: 08/01/2013 12:58   Dg Hip Bilateral W/pelvis  08/01/2013   CLINICAL DATA:  Fall, right wrist and hand pain  EXAM: BILATERAL HIP WITH PELVIS - 4+ VIEW  COMPARISON:  None.  FINDINGS: No acute fracture or malalignment. The femoral heads are located. Dystrophic calcifications projecting over the left sacral a ala are favored to reflect degenerated uterine fibroids. Atherosclerotic vascular calcifications are present within  both femoral arterial systems. The bones are mildly osteopenic. Mild bilateral hip joint osteoarthritis. Degenerative disc disease noted in the visualized lower lumbar spine. The visualized bowel gas pattern is not obstructed.  IMPRESSION: 1. No acute fracture or malalignment. 2. Mild bilateral hip joint osteoarthritis. 3. Atherosclerotic vascular calcifications. 4. Lower lumbar degenerative disc disease.   Electronically Signed   By: Malachy Moan M.D.   On: 08/01/2013 12:53   Dg Knee Complete 4 Views Left  08/01/2013   CLINICAL DATA:  Fall, bilateral knee pain  EXAM: LEFT KNEE - COMPLETE 4+ VIEW  COMPARISON:  Concurrently obtained radiographs of the contralateral right knee  FINDINGS: No acute fracture, malalignment or knee joint effusion. Moderately advanced tricompartmental degenerative osteoarthritis most significant in the patellofemoral and lateral compartments. Chondrocalcinosis is noted in both medial and lateral femoral compartments. Atherosclerotic calcifications noted throughout the visualized vessels. Soft tissues are unremarkable. Normal bony mineralization. No lytic or blastic osseous lesion.  IMPRESSION: 1. No acute fracture, malalignment or joint effusion. 2. Moderate to advanced tricompartmental knee joint osteoarthritis worst in the lateral and patellofemoral compartments. 3. Chondrocalcinosis. 4. Atherosclerotic vascular calcifications.   Electronically Signed   By: Malachy Moan M.D.   On: 08/01/2013 12:54   Dg Knee Complete 4 Views Right  08/01/2013   CLINICAL DATA:  Fall, bilateral knee pain  EXAM: RIGHT KNEE - COMPLETE 4+ VIEW  COMPARISON:  Concurrently obtained radiographs of the contralateral left knee  FINDINGS: No acute fracture or malalignment. There is a trace effusion in the suprapatellar recess. Moderately advanced tricompartmental degenerative osteoarthritis most significant in the patellofemoral and lateral compartments. Chondrocalcinosis is noted in both medial and  lateral femoral compartments. Atherosclerotic calcifications noted throughout the visualized vessels. Soft tissues are unremarkable. Normal bony mineralization. No lytic or blastic osseous lesion.  IMPRESSION: No acute fracture, or malalignment.  Trace suprapatellar joint effusion is likely degenerative in etiology.  Moderate to advanced tricompartmental knee joint osteoarthritis worst in the lateral and patellofemoral compartments.  Chondrocalcinosis.  Atherosclerotic vascular calcifications.   Electronically Signed   By: Malachy Moan M.D.   On: 08/01/2013 12:55   Dg Hand Complete Right  08/01/2013   CLINICAL DATA:  Fall, right hand and wrist pain  EXAM: RIGHT HAND - COMPLETE 3+ VIEW  COMPARISON:  Concurrently obtained radiographs of the wrist  FINDINGS: No acute fracture or malalignment. Bones are mildly osteopenic. Scapholunate advanced  collapse of the wrist better demonstrated on concurrently obtained wrist radiographs. Atherosclerotic vascular calcifications in the radial artery. Mild degenerative change in the distal interphalangeal joints.  IMPRESSION: 1. No acute fracture or malalignment. 2. Wrist changes better demonstrated on the concurrently obtained and separately dictated dedicated wrist radiographs. 3. Atherosclerotic vascular calcifications.   Electronically Signed   By: Malachy Moan M.D.   On: 08/01/2013 12:59     EKG Interpretation None      MDM   Final diagnoses:  None    80 yo female who presents with bilateral hip pain, knee pain and right wrist pain s/p fall yesterday. Concern for fracture. Mechanism of fall appears to have been mechanical. No evidence of pre-syncopal etiology. - bilateral hip, knee and right wrist not showing any evidence of acute fracture or dislocation - patient unable to bear weight safely. Concern for increased fall risk at home and patient safety if patient discharged home.   - Will obtain basic labs and call for admission for pain control,  inability to ambulate safely, increased fall risk and PT/OT eval.  - right wrist immobilizer applied in ED  Marena Chancy, PGY-3 Family Medicine Resident      Lonia Skinner, MD 08/01/13 [phone]

## 2013-08-01 NOTE — H&P (Signed)
Family Medicine Teaching [facility] Admission History and Physical Service Pager: [phone]  Patient name: Karina Willis Medical record number: [id number] Date of birth: 1932-09-19 Age: 80 y.o. Gender: female  Primary Care Provider: Peter Swaziland, MD Consultants: none Code Status: full  Chief Complaint: fall  Assessment and Plan: Karina Willis is a 80 y.o. female presenting with a mechanical fall at home day PTA . PMH is significant for HTN, macrocytic anemia, HLD, mitral valve prolapse, chronic systolic CHF, CKD stage iii, osteoarthritis, CAD  #MSK: Presumed mechanical fall- no associated syncopal episode. Pt attesting to leg giving out on her. Ddx includes MSK (has known arthritis. XRays in the ED largely unremarkable) vs cardiogenic (does have known cardiac hx including CHF, CAD and mitral valve prolapse, no current CP) vs neurogenic (hx of TIAs on ASA) vs deconditioning (reports being independent in all ADLs and largely mobile around home) vs infection (no WBC, afebrile, u/a wnl) vs medication overdose (pinpoint pupils but no recent changes to meds). Suspect pain to cont to peak at days 3-4 before it improves -admit to tele under Dr. Mauricio Po, monitor for any cardiac event -up with assistance as pt able to tolerate -PT/OT consults -possible consult to SW for placement if needed although patient will likely refuse -repeat morning labs CBC/BMET -cont to place pt in wrist immobilizer  -EKG; no indication for Troponin   #CV: CAD, systolic CHF, mitral valve prolapse, HTN; does not appear to have cardiogenic etiology assc with above; sees Dr. Swaziland from cardiology. Unable to locate most recent outpt ECHO in [facility]. Pt euvolemic on admission; currently normotensive  -monitor events on tele  -hold on repeat ECHO for now unless events noted on tele -home meds include: ASA 325 (takes 2 tabs per day??), amlodipine 10mg , lasix 40mg , lopressor 50mg  BID, ramipril 10mg , zocor 20mg , spironolactone  25 -will cont all of above with exception of asa, which will switch to 81mg  for now (particularly in light of recent fall) -vitals per floor protocol   #Heme: known macrocytic anemia and VitB12 deficiency; possible component of neuropathy with vitamin deficiency increasing patient's risk for fall -will send for anemia panel -repeat am CBC  #Renal: CKD stage iii; Cr appears to currently be better than baseline 0.92 -trend Cr -no indication for CK at this time   #FEN/GI: HHD, no IV access at this time -will monitor i/os -encourage PO intake -consider orthostatics  Prophylaxis: lovenox, up with assistance as pt tolerates    Disposition: admit to obs under Dr. Mauricio Po   History of Present Illness: Karina Willis is a 80 y.o. female presenting with mechanical fall when walking away from the kitchen. Felt her leg give out on her. But otherwise is unsure about what prompted fall. No chest pain, palpitations SOB associated with this episode. Denies recent changes in meds and infections has otherwise been feeling okay. Pt believes she tried to break fall by placing hands out in front of her. Denies LOC or head trauma. Did note some swelling in her lower lip. She was able to pull herself back up to a seated position and went about her daily activities. This morning was unable to move much and bear weight on her legs. Pain mostly in left knee and right wrist. Prompted by nephew to come to the ED.   In the ED pt with bilat hip, knee and right wrist films without acute fracture or dislocation. Was unable to bear weight safely. CBC (hgb 11.4) and BMET largely unremarkable. Placed in  a right wrist immobilzer  Review Of Systems: Per HPI with the following additions: none Otherwise 12 point review of systems was performed and was unremarkable.  Patient Active Problem List   Diagnosis Date Noted  . Bilateral hip pain 08/01/2013  . CHF (congestive heart failure)   . Mitral insufficiency   . Coronary  artery disease   . SOB (shortness of breath)   . History of TIAs   . Arthritis   . ANEMIA, MACROCYTIC 04/24/2009  . UNSPECIFIED VITAMIN D DEFICIENCY 06/13/2008  . Chronic kidney disease (CKD), stage III (moderate) 06/13/2008  . POSTMENOPAUSAL STATUS 06/03/2008  . KNEE PAIN, LEFT 12/03/2007  . HYPERCHOLESTEROLEMIA 05/08/2006  . OBESITY, NOS 05/08/2006  . HYPERTENSION, BENIGN SYSTEMIC 05/08/2006  . COR PULMONALE 05/08/2006  . MITRAL VALVE PROLAPSE 05/08/2006  . CHRONIC SYSTOLIC HEART FAILURE 05/08/2006  . OSTEOARTHRITIS, MULTI SITES 05/08/2006   Past Medical History: Past Medical History  Diagnosis Date  . CHF (congestive heart failure)   . Systolic dysfunction     CHRONIC, EF 25-30%  . Mitral insufficiency     SEVERE  . Coronary artery disease 2001    WITH DOCUMENTED TOTAL OCCLUSION OF THE RIGHT CORONARY AND THE LEFT CIRCUMFLEX CORONARY  . SOB (shortness of breath)   . Hypertension   . Hyperlipidemia   . History of TIAs   . Arthritis   . Non compliance w medication regimen   . Neck pain    Past Surgical History: Past Surgical History  Procedure Laterality Date  . Cardiac catheterization  06/05/1999    ENLARGED LEFT VENTRICULAR SIZE. THERE IS AKINESIA OF THE INFERIOR BASE. LEFT VENTRICULAR  FUNCTIONS APPEAR TO BE MODERATELY REDUCED WITH EF 35-40%  . Appendectomy    . Left arm surgery     Social History: History  Substance Use Topics  . Smoking status: Former Smoker    Types: Cigarettes    Quit date: 08/21/1983  . Smokeless tobacco: Never Used  . Alcohol Use: No   Additional social history: independent in all ADLs; gets some assistance when she needs it from family members  Please also refer to relevant sections of EMR.  Family History: Family History  Problem Relation Age of Onset  . Heart attack Mother   . Heart attack Father   . Heart disease Sister   . Heart attack Brother   . Heart attack Brother   . Heart attack Brother   . Heart disease Sister     Allergies and Medications: Allergies  Allergen Reactions  . Penicillins Hives and Itching   No current facility-administered medications on file prior to encounter.   Current Outpatient Prescriptions on File Prior to Encounter  Medication Sig Dispense Refill  . amLODipine (NORVASC) 10 MG tablet TAKE 1 TABLET BY MOUTH EVERY DAY FOR BLOOD PRESSURE  30 tablet  0  . aspirin 325 MG tablet Takes 2 tablets daily per Dr. SwazilandJordan       . cyanocobalamin ([employer] VITAMIN B12) 2000 MCG tablet Take 2,000 mcg by mouth daily.        . furosemide (LASIX) 40 MG tablet TAKE 1 TABLET BY MOUTH EVERY DAY  30 tablet  0  . metoprolol (LOPRESSOR) 50 MG tablet Take 1 tablet (50 mg total) by mouth 2 (two) times daily.  60 tablet  5  . NITROSTAT 0.4 MG SL tablet PLACE 1 TABLET UNDER THE TONGUE EVERY 5 MINUTES AS NEEDED FOR CHEST PAIN. TAKE AS NEEDED PER CARDS  25 tablet  0  .  ramipril (ALTACE) 10 MG capsule TAKE ONE CAPSULE BY MOUTH EVERY DAY  30 capsule  0  . simvastatin (ZOCOR) 40 MG tablet Take 0.5 tablets (20 mg total) by mouth at bedtime.  30 tablet  5  . spironolactone (ALDACTONE) 25 MG tablet TAKE 1 TABLET BY MOUTH DAILY  30 tablet  0    Objective: BP 142/68  Pulse 74  Temp(Src) 98.6 F (37 C) (Oral)  Resp 18  SpO2 96% Exam: General: NAD, awake alert, lower lip bruising and swelling  HEENT: NCAT, PERRL (pinpoint pupils), EOMI, poor dentition  Cardiovascular: RRR, ii-iii/vi systolic murmur appreciated at the LSB  Respiratory: CTAB, no wheezes, rubs Abdomen: soft, NTND, hyperactive bowel sounds  Extremities: limited ROM in right hand/wrist; (currently in immobilizer) tender and swollen along 3rd MCP; left knee with limited flexion to approx 20degrees, nml posterior/anterior drawer; unable to perform mcmurrays 2/2 discomfort; no joint line tenderness appreciated Skin: no rashes or lesion, only bruising noted on lower lip; tattoo on right upper arm   Neuro: alert and oriented X3; knows president is obama,  year is 44, and that she is at [facility]   Labs and Imaging: CBC BMET   Recent Labs Lab 08/01/13 1407  WBC 10.3  HGB 11.4*  HCT 34.8*  PLT 168    Recent Labs Lab 08/01/13 1407  NA 138  K 5.0  CL 102  CO2 24  BUN 11  CREATININE 0.92  GLUCOSE 105*  CALCIUM 9.9     Urinalysis    Component Value Date/Time   COLORURINE YELLOW 08/01/2013 1402   APPEARANCEUR CLEAR 08/01/2013 1402   LABSPEC 1.011 08/01/2013 1402   PHURINE 6.5 08/01/2013 1402   GLUCOSEU NEGATIVE 08/01/2013 1402   HGBUR NEGATIVE 08/01/2013 1402   BILIRUBINUR NEGATIVE 08/01/2013 1402   KETONESUR NEGATIVE 08/01/2013 1402   PROTEINUR NEGATIVE 08/01/2013 1402   UROBILINOGEN 0.2 08/01/2013 1402   NITRITE NEGATIVE 08/01/2013 1402   LEUKOCYTESUR NEGATIVE 08/01/2013 1402   Anselm Lis, MD 08/01/2013, 3:07 PM PGY-1, [facility] FPTS Intern pager: [phone], text pages welcome  I have seen and evaluated the above patient with Dr. Michail Jewels.  I agree with the above note. Addendum in blue.  Everlene Other DO Family Medicine PGY-2

## 2013-08-01 NOTE — ED Notes (Signed)
Pt continues in x-ray

## 2013-08-01 NOTE — Progress Notes (Signed)
Patient states that due to the circumstances of this am, she is uncertain of which meds she has taken and which ones she has not. So with that in mind pt states she will not be taking any more medications today. Entered into record as patient requested.

## 2013-08-02 DIAGNOSIS — W19XXXA Unspecified fall, initial encounter: Secondary | ICD-10-CM

## 2013-08-02 DIAGNOSIS — M25559 Pain in unspecified hip: Principal | ICD-10-CM

## 2013-08-02 DIAGNOSIS — Y92009 Unspecified place in unspecified non-institutional (private) residence as the place of occurrence of the external cause: Secondary | ICD-10-CM

## 2013-08-02 LAB — CBC
HEMATOCRIT: 31.8 % — AB (ref 36.0–46.0)
Hemoglobin: 10 g/dL — ABNORMAL LOW (ref 12.0–15.0)
MCH: 32.6 pg (ref 26.0–34.0)
MCHC: 31.4 g/dL (ref 30.0–36.0)
MCV: 103.6 fL — ABNORMAL HIGH (ref 78.0–100.0)
Platelets: 149 10*3/uL — ABNORMAL LOW (ref 150–400)
RBC: 3.07 MIL/uL — ABNORMAL LOW (ref 3.87–5.11)
RDW: 13.2 % (ref 11.5–15.5)
WBC: 7.6 10*3/uL (ref 4.0–10.5)

## 2013-08-02 LAB — RAPID URINE DRUG SCREEN, HOSP PERFORMED
AMPHETAMINES: NOT DETECTED
Barbiturates: NOT DETECTED
Benzodiazepines: NOT DETECTED
COCAINE: NOT DETECTED
OPIATES: NOT DETECTED
TETRAHYDROCANNABINOL: NOT DETECTED

## 2013-08-02 LAB — BASIC METABOLIC PANEL
BUN: 10 mg/dL (ref 6–23)
CHLORIDE: 106 meq/L (ref 96–112)
CO2: 22 mEq/L (ref 19–32)
Calcium: 9.2 mg/dL (ref 8.4–10.5)
Creatinine, Ser: 0.9 mg/dL (ref 0.50–1.10)
GFR calc Af Amer: 69 mL/min — ABNORMAL LOW (ref >90)
GFR, EST NON AFRICAN AMERICAN: 59 mL/min — AB (ref >90)
GLUCOSE: 93 mg/dL (ref 70–99)
POTASSIUM: 4.5 meq/L (ref 3.7–5.3)
Sodium: 139 mEq/L (ref 137–147)

## 2013-08-02 NOTE — Progress Notes (Signed)
Family Medicine Teaching Service Daily Progress Note Intern Pager: [phone]  Patient name: SHANASIA NIERENBERG Medical record number: [id number] Date of birth: 1933-02-21 Age: 80 y.o. Gender: female  Primary Care Provider: Peter Swaziland, MD Consultants: none Code Status: Full  Pt Overview and Major Events to Date:    Assessment and Plan: GWENEVERE PRIOR is a 80 y.o. female presenting with a mechanical fall at home day PTA . PMH is significant for HTN, macrocytic anemia, HLD, mitral valve prolapse, chronic systolic CHF, CKD stage iii, osteoarthritis, CAD   #MSK: Presumed mechanical fall- no associated syncopal episode. Pt attesting to leg giving out on her. Ddx includes MSK (has known arthritis. XRays in the ED largely unremarkable) vs cardiogenic (does have known cardiac hx including CHF, CAD and mitral valve prolapse, no current CP) vs neurogenic (hx of TIAs on ASA) vs deconditioning (reports being independent in all ADLs and largely mobile around home) vs infection (no WBC, afebrile, u/a wnl) vs medication overdose (pinpoint pupils but no recent changes to meds). Suspect pain to cont to peak at days 3-4 before it improves  -up with assistance as pt able to tolerate  -PT/OT consults  -possible consult to SW for placement if needed although patient will likely refuse  -cont to place pt in wrist immobilizer  -repeat AM labs appear stable, bmet wnl, Hgb 11.4>10.0  #CV: CAD, systolic CHF, mitral valve prolapse, HTN; does not appear to have cardiogenic etiology assc with above; sees Dr. Swaziland from cardiology. Unable to locate most recent outpt ECHO in [facility] Pt euvolemic on admission; currently normotensive  -home meds include: ASA 325 (takes 2 tabs per day??), amlodipine 10mg , lasix 40mg , lopressor 50mg  BID, ramipril 10mg , zocor 20mg , spironolactone 25  -ASA 81mg  while in hospital  #Heme: known macrocytic anemia and VitB12 deficiency; possible component of neuropathy with vitamin deficiency increasing  patient's risk for fall  -Iron panel: iron 43, TIBC 293, Sat 15, Ferritin 69 -Hgb 11.4>10.0  #Renal: CKD stage 3; Cr appears to currently be better than baseline 0.92  -trend Cr  -no indication for CK at this time   #FEN/GI: HHD, no IV access at this time  Prophylaxis: lovenox, up with assistance as pt tolerates   Disposition: pending pt/ot evals   Subjective:  Has right hand/wrist swelling and some pain, but overall feels okay today. She feels ready to go home if she can get up and walk. She declines any recommendation of SNF but if recommended would like home health services.  Objective: Temp:  [98.2 F (36.8 C)-98.8 F (37.1 C)] 98.8 F (37.1 C) (05/25 0452) Pulse Rate:  [69-99] 71 (05/25 0452) Resp:  [13-20] 19 (05/25 0452) BP: (133-165)/(52-96) 153/69 mmHg (05/25 0452) SpO2:  [85 %-100 %] 99 % (05/25 0452) Physical Exam: General: NAD Cardiovascular: RRR, normal s1/s2, 2/6 systolic murmur Respiratory: CTAB, normal effort Abdomen: soft, NTND, bowel sounds normal Extremities: right wrist in splint, minimal movement of fingers limited by pain. No edema/cyanosis in LE. Neuro: alert and oriented, no focal deficits.  Laboratory:  Recent Labs Lab 08/01/13 1407 08/02/13 0430  WBC 10.3 7.6  HGB 11.4* 10.0*  HCT 34.8* 31.8*  PLT 168 149*    Recent Labs Lab 08/01/13 1407 08/02/13 0430  NA 138 139  K 5.0 4.5  CL 102 106  CO2 24 22  BUN 11 10  CREATININE 0.92 0.90  CALCIUM 9.9 9.2  GLUCOSE 105* 93    Imaging/Diagnostic Tests:   Tawni Carnes, MD 08/02/2013, 6:46 AM PGY-1,  [facility] Intern pager: [phone], text pages welcome

## 2013-08-02 NOTE — Progress Notes (Signed)
FMTS Attending Note Patient seen and examined by me, discussed with resident team and I agree with Dr Laban Emperor note for today.  See H&P for further details.  Patient is likely to refuse SNF recommendation (she pre-emptively refused with me during interview), and she appears competent to make this decision for herself. Likely discharge later today with plans for re-imaging injured wrist in 10-14 days to exclude scaphoid fracture. Immobilizer until then.  Paula Compton, MD

## 2013-08-02 NOTE — Evaluation (Signed)
Physical Therapy Evaluation Patient Details Name: Karina Willis MRN: [id number] DOB: 06/10/1932 Today's Date: 08/02/2013   History of Present Illness  80 y.o. female presenting with a mechanical fall at home day PTA . PMH is significant for HTN, macrocytic anemia, HLD, mitral valve prolapse, chronic systolic CHF, CKD stage iii, osteoarthritis, CAD   Clinical Impression  Pt adm due to the above. Pt greatly limited with all functional mobility due to pain and inability to WB through Lt LE. Pt to benefit from skilled acute PT to address deficits indicated below and maximize independence with functional mobility. Pt hopes to D/C to home but at this time due to lack of mobility, will recommend SNF for post acute rehab.     Follow Up Recommendations SNF;Supervision/Assistance - 24 hour    Equipment Recommendations  Other (comment);Rolling walker with 5" wheels (TBD )    Recommendations for Other Services OT consult     Precautions / Restrictions Precautions Precautions: Fall Restrictions Weight Bearing Restrictions: No      Mobility  Bed Mobility Overal bed mobility: Needs Assistance Bed Mobility: Supine to Sit     Supine to sit: Mod assist     General bed mobility comments: pt with difficulty returning to supine position; required mod (A) to advance LEs and control trunk due to pain and generalized weakness  Transfers Overall transfer level: Needs assistance Equipment used: Rolling walker (2 wheeled) Transfers: Sit to/from Stand Sit to Stand: Max assist         General transfer comment: requires max (A) to achieve upright standing position and clear hips from bed; pt greatly limited due to inability to WB through Lt LE; cues for hand placement and sequeninc with RW: attempted pre gt activity at EOB ( marching) pt unable to clear Rt LE off ground due to inability to WB through Lt LE; pt with fwd flexed trunk throughout standing    Ambulation/Gait             General  Gait Details: will require 2 person for mobility/gt at this time   Stairs            Wheelchair Mobility    Modified Rankin (Stroke Patients Only)       Balance Overall balance assessment: Needs assistance;History of Falls Sitting-balance support: Feet supported;No upper extremity supported Sitting balance-Leahy Scale: Good   Postural control: Posterior lean Standing balance support: During functional activity;Bilateral upper extremity supported Standing balance-Leahy Scale: Poor Standing balance comment: requires (A) and RW to maintain balance in standing; tolerated standing ~1 min and limited by pain                              Pertinent Vitals/Pain 10/10 "when im on it"; 2/10 in bed     Home Living Family/patient expects to be discharged to:: Skilled nursing facility Living Arrangements: Other relatives;Other (Comment) (nephew )               Additional Comments: pt lives with nephew in apartment; was independent with all ADLs and ambulating without AD in house and with SPC in community; would like to D/C home; has tub shower but has had difficulty recently getting in/out    Prior Function Level of Independence: Independent with assistive device(s)         Comments: SPC in community only      Hand Dominance        Extremity/Trunk Assessment  Upper Extremity Assessment: Defer to OT evaluation           Lower Extremity Assessment: Generalized weakness;LLE deficits/detail   LLE Deficits / Details: quad 3+/5 ; all ROM limited by pain; flexion ~60 degrees   Cervical / Trunk Assessment: Kyphotic;Other exceptions  Communication   Communication: No difficulties  Cognition Arousal/Alertness: Awake/alert Behavior During Therapy: WFL for tasks assessed/performed Overall Cognitive Status: No family/caregiver present to determine baseline cognitive functioning Area of Impairment: Orientation Orientation Level: Disoriented to;Time                   General Comments General comments (skin integrity, edema, etc.): at length discussion regarding D/C planning     Exercises General Exercises - Lower Extremity Ankle Circles/Pumps: AROM;Both;10 reps;Supine      Assessment/Plan    PT Assessment Patient needs continued PT services  PT Diagnosis Difficulty walking;Generalized weakness;Acute pain   PT Problem List Decreased strength;Decreased range of motion;Decreased activity tolerance;Decreased balance;Decreased mobility;Decreased knowledge of use of DME;Decreased safety awareness;Decreased cognition;Pain  PT Treatment Interventions DME instruction;Gait training;Functional mobility training;Therapeutic activities;Therapeutic exercise;Balance training;Neuromuscular re-education;Patient/family education   PT Goals (Current goals can be found in the Care Plan section) Acute Rehab PT Goals Patient Stated Goal: to go back home when i can move PT Goal Formulation: With patient Time For Goal Achievement: 08/09/13 Potential to Achieve Goals: Good    Frequency Min 3X/week   Barriers to discharge        Co-evaluation               End of Session Equipment Utilized During Treatment: Gait belt Activity Tolerance: Patient limited by pain Patient left: in bed;with bed alarm set Nurse Communication: Mobility status    Functional Assessment Tool Used: clinical judgement  Functional Limitation: Mobility: Walking and moving around Mobility: Walking and Moving Around Current Status (T6256): At least 40 percent but less than 60 percent impaired, limited or restricted Mobility: Walking and Moving Around Goal Status [phone]): At least 1 percent but less than 20 percent impaired, limited or restricted    Time: 0928-0946 PT Time Calculation (min): 18 min   Charges:   PT Evaluation $Initial PT Evaluation Tier I: 1 Procedure PT Treatments $Therapeutic Activity: 8-22 mins   PT G Codes:   Functional Assessment Tool Used:  clinical judgement  Functional Limitation: Mobility: Walking and moving around    [address], [place]  [phone] 08/02/2013, 10:10 AM

## 2013-08-02 NOTE — Evaluation (Signed)
Occupational Therapy Evaluation Patient Details Name: Karina Willis MRN: [id number] DOB: 11/16/1932 Today's Date: 08/02/2013    History of Present Illness 80 y.o. female presenting with a mechanical fall at home day PTA . PMH is significant for HTN, macrocytic anemia, HLD, mitral valve prolapse, chronic systolic CHF, CKD stage iii, osteoarthritis, CAD    Clinical Impression   This 80 yo female admitted with above presents to acute OT without x-rays revealing any fractures of Bil LEs or RUE; however still with intermittent c/o right wrist hurting her and left knee (when bent and when bearing weight). PTA she was independent in her BADLs and some IADLs, now she is limited in these due to increased pain in RUE/LLE, decreased AROM in RUE/LLE, increased fall risk and will benefit from continued OT on acute as well as at SNF to get back to her independent level.    Follow Up Recommendations  SNF    Equipment Recommendations   (TBD next venue)       Precautions / Restrictions Precautions Precautions: Fall Required Braces or Orthoses:  (I applied ace wrap to right knee to see if patient felt this helped with bearing weight--she reported that it did) Restrictions Weight Bearing Restrictions: No      Mobility Bed Mobility Overal bed mobility: Needs Assistance Bed Mobility: Supine to Sit     Supine to sit: HOB elevated;Min assist     General bed mobility comments: A to reach for right rail with LUE  Transfers Overall transfer level: Needs assistance Equipment used: Rolling walker (2 wheeled) Transfers: Sit to/from Stand Sit to Stand: Min assist             Balance Overall balance assessment: Needs assistance Sitting-balance support: Feet supported;No upper extremity supported Sitting balance-Leahy Scale: Good   Postural control: Posterior lean Standing balance support: Bilateral upper extremity supported Standing balance-Leahy Scale: Poor                              ADL Overall ADL's : Needs assistance/impaired Eating/Feeding: Set up;Sitting (due to RUE splint)   Grooming: Set up;Supervision/safety;Sitting   Upper Body Bathing: Sitting;Set up;Supervision/ safety   Lower Body Bathing: Moderate assistance;Sit to/from stand   Upper Body Dressing : Minimal assistance;Sitting   Lower Body Dressing: Maximal assistance;Sit to/from stand   Toilet Transfer: Minimal assistance (bed>over to window>sit in recliner)   Toileting- Clothing Manipulation and Hygiene: Sit to/from stand;Minimal assistance       Functional mobility during ADLs: Minimal assistance                 Pertinent Vitals/Pain Did not rate pain in LLE, did say with ace wrap it did not hurt as much when moving     Hand Dominance Right   Extremity/Trunk Assessment Upper Extremity Assessment Upper Extremity Assessment: RUE deficits/detail RUE Deficits / Details: Decreased use of RUE due to painful wrist area. Pt's hands appear arthritic, she c/o intermittent pain in wrist with movement (same movement does not always make it hurt). RUE Coordination: decreased fine motor       Communication Communication Communication: No difficulties   Cognition Arousal/Alertness: Awake/alert Behavior During Therapy: WFL for tasks assessed/performed Overall Cognitive Status: Within Functional Limits for tasks assessed                     Exercises Exercises: Other Exercises Other Exercises: Removed pt's RUE soft splint and had patient try and  move her right digits, wrist, and forearm. Explained to her that it is fine for her to take splint off while in bed or chair and try to move and use her arm/hand; but if she if up moving she needs to have splint on. She can use RUE to her tolerance.        Home Living Family/patient expects to be discharged to:: Skilled nursing facility Living Arrangements: Other relatives (nephew)                                Additional Comments: pt lives with nephew in apartment; was independent with all ADLs and ambulating without AD in house and with SPC in community; would like to D/C home; has tub shower but has had difficulty recently getting in/out      Prior Functioning/Environment Level of Independence: Independent with assistive device(s)        Comments: SPC in community only; was cooking all mealsl; other family did household chores    OT Diagnosis: Generalized weakness;Acute pain   OT Problem List: Decreased range of motion;Impaired balance (sitting and/or standing);Pain;Decreased knowledge of use of DME or AE   OT Treatment/Interventions: Self-care/ADL training;Patient/family education;Balance training;DME and/or AE instruction;Therapeutic activities    OT Goals(Current goals can be found in the care plan section) Acute Rehab OT Goals Patient Stated Goal: to get back home OT Goal Formulation: With patient Time For Goal Achievement: 08/09/13 Potential to Achieve Goals: Good  OT Frequency: Min 2X/week              End of Session Equipment Utilized During Treatment: Gait belt;Rolling walker Nurse Communication:  (I wrapped her left knee with an ace wrap)  Activity Tolerance: Patient tolerated treatment well Patient left: in chair;with call bell/phone within reach   Time: 1109-1139 OT Time Calculation (min): 30 min Charges:  OT General Charges $OT Visit: 1 Procedure OT Evaluation $Initial OT Evaluation Tier I: 1 Procedure OT Treatments $Self Care/Home Management : 23-37 mins G-Codes: OT G-codes **NOT FOR INPATIENT CLASS** Functional Assessment Tool Used: Clinical observation Functional Limitation: Self care Self Care Current Status (B8466): At least 40 percent but less than 60 percent impaired, limited or restricted Self Care Goal Status (Z9935): At least 20 percent but less than 40 percent impaired, limited or restricted  Evette Georges [phone] 08/02/2013, 12:18  PM

## 2013-08-02 NOTE — H&P (Signed)
FMTS Attending Admit NOte Patient seen and examined by me, discussed with resident team and I agree with Dr Patsey Berthold assessment and plan for admission. This morning the patient is alert and oriented to place [facility]"), and time ("May 25, it's Memorial Day").  Is clear about the events of her fall, which did not feature LOC or chest pain/palpitations.  Plan for PT/OT to assess gait stability; continue with R wrist immobilizer and plan for reimaging in 10-14 days. Patient is clear that she would not want to go to inpatient rehab/SNF if recommended by PT and medical team. She lives at home with her nephew and performs her own ADLs, uses cane when she goes out of the house. Paula Compton, MD

## 2013-08-03 DIAGNOSIS — D649 Anemia, unspecified: Secondary | ICD-10-CM | POA: Diagnosis present

## 2013-08-03 LAB — CBC
HCT: 35.5 % — ABNORMAL LOW (ref 36.0–46.0)
Hemoglobin: 11.2 g/dL — ABNORMAL LOW (ref 12.0–15.0)
MCH: 32.7 pg (ref 26.0–34.0)
MCHC: 31.5 g/dL (ref 30.0–36.0)
MCV: 103.8 fL — AB (ref 78.0–100.0)
Platelets: 146 10*3/uL — ABNORMAL LOW (ref 150–400)
RBC: 3.42 MIL/uL — ABNORMAL LOW (ref 3.87–5.11)
RDW: 13.1 % (ref 11.5–15.5)
WBC: 11.8 10*3/uL — ABNORMAL HIGH (ref 4.0–10.5)

## 2013-08-03 MED ORDER — OXYCODONE HCL 5 MG PO TABS
5.0000 mg | ORAL_TABLET | Freq: Four times a day (QID) | ORAL | Status: DC | PRN
  Administered 2013-08-03: 5 mg via ORAL
  Filled 2013-08-03: qty 1

## 2013-08-03 MED ORDER — SODIUM CHLORIDE 0.9 % IV BOLUS (SEPSIS)
500.0000 mL | Freq: Once | INTRAVENOUS | Status: AC
  Administered 2013-08-03: 500 mL via INTRAVENOUS

## 2013-08-03 MED ORDER — TRAMADOL HCL 50 MG PO TABS
25.0000 mg | ORAL_TABLET | Freq: Two times a day (BID) | ORAL | Status: DC | PRN
  Administered 2013-08-03: 25 mg via ORAL
  Filled 2013-08-03: qty 1

## 2013-08-03 NOTE — Discharge Summary (Signed)
Family Medicine [facility] Discharge Summary  Patient name: Karina Willis Medical record number: [id number] Date of birth: 12/14/1932 Age: 80 y.o. Gender: female Date of Admission: 08/01/2013  Date of Discharge: 08/04/2013 Admitting Physician: Barbaraann BarthelJames O Breen, MD  Primary Care Provider: Peter SwazilandJordan, MD Consultants: none  Indication for Hospitalization: Fall  Discharge Diagnoses/Problem List:  Mechanical fall CAD Systolic CHF Mitral Valve Prolapse Hypertension Hyperlipidemia Macrocytic anemia  Disposition: SNF  Discharge Condition: stable, improved  Brief Hospital Course:  Karina Willis is a 80 y.o. female was admitted for observation after mechanical fall. PMH significant for HTN, macrocytic anemia, HLD, systolic CHF, CAD, MVP, osteoarthritis. On admission patient had stable vitals, workup negative for cause other than mechanical in nature including EKG, CXR, multiple x-rays of hips, knees, right hand and wrist were all negative for fracture. A wrist splint was applied and she was monitored overnight. Evaluated by PT and OT and recommended SNF, however patient declined this. On day of discharge patient was unable to ambulate due to increased pain and was then agreeable to SNF. She had a low BP the same day that responded to 500cc bolus. The following morning she complained of pain in her right great toe, x-rays showed no acute fracture; uric acid level was 6.3. She was stable for discharge on tylenol and ibuprofen for pain management.  Issues for Follow Up:  1. Pain management: recommending NSAID use for 4-5 days to help with inflammation, and then likely discontinue their use due to GI/renal side effects 2. Macrocytic anemia: folate and B12 levels are normal, iron panel and ferritin did not show definitive iron deficiency (low sat ratio). Hgb stable during admission  Significant Procedures: none  Significant Labs and Imaging:   Recent Labs Lab 08/02/13 0430  08/03/13 1500 08/04/13 1154  WBC 7.6 11.8* 9.9  HGB 10.0* 11.2* 10.8*  HCT 31.8* 35.5* 33.4*  PLT 149* 146* 168    Recent Labs Lab 08/01/13 1407 08/02/13 0430  NA 138 139  K 5.0 4.5  CL 102 106  CO2 24 22  GLUCOSE 105* 93  BUN 11 10  CREATININE 0.92 0.90  CALCIUM 9.9 9.2   UA: normal UDS: negative Iron 43, TIBC 293, sat ratio 15%, UIBC 250 Ferritin 69 Folate 5.2 (indeterminate) Vitamin B12 335 Uric acid 6.3  Dg Wrist Complete Right  08/01/2013   CLINICAL DATA:  Fall, right wrist and hand pain  EXAM: RIGHT WRIST - COMPLETE 3+ VIEW  COMPARISON:  Concurrently obtained radiographs of the hand  FINDINGS: No acute fracture or malalignment. There is widening of the scapholunate interspace with proximal descends of the capitate consistent with scapholunate advanced collapse. Well corticated ossific bodies adjacent to the ulnar styloid may reflect the sequelae of remote fracture or degenerative changes. There is marked narrowing of the radiocarpal joint space. Degenerative changes also present at the STT joint and base of the thumb CMC joint. Atherosclerotic vascular calcifications present throughout the visualized radial artery.  IMPRESSION: 1. No definite acute fracture or malalignment. 2. 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 3. Degenerative dystrophic calcifications adjacent to the ulnar and radial styloid. 4. Degenerative osteoarthritis at the STT and the thumb CMC joints. 5. Atherosclerotic vascular calcifications.   Electronically Signed   By: Malachy MoanHeath  McCullough M.D.   On: 08/01/2013 12:58   Dg Hip Bilateral W/pelvis  08/01/2013   CLINICAL DATA:  Fall, right wrist and hand pain  EXAM: BILATERAL HIP WITH  PELVIS - 4+ VIEW  COMPARISON:  None.  FINDINGS: No acute fracture or malalignment. The femoral heads are located. Dystrophic calcifications projecting over the left sacral a ala are favored to  reflect degenerated uterine fibroids. Atherosclerotic vascular calcifications are present within both femoral arterial systems. The bones are mildly osteopenic. Mild bilateral hip joint osteoarthritis. Degenerative disc disease noted in the visualized lower lumbar spine. The visualized bowel gas pattern is not obstructed.  IMPRESSION: 1. No acute fracture or malalignment. 2. Mild bilateral hip joint osteoarthritis. 3. Atherosclerotic vascular calcifications. 4. Lower lumbar degenerative disc disease.   Electronically Signed   By: Malachy Moan M.D.   On: 08/01/2013 12:53   Dg Knee Complete 4 Views Left  08/01/2013   CLINICAL DATA:  Fall, bilateral knee pain  EXAM: LEFT KNEE - COMPLETE 4+ VIEW  COMPARISON:  Concurrently obtained radiographs of the contralateral right knee  FINDINGS: No acute fracture, malalignment or knee joint effusion. Moderately advanced tricompartmental degenerative osteoarthritis most significant in the patellofemoral and lateral compartments. Chondrocalcinosis is noted in both medial and lateral femoral compartments. Atherosclerotic calcifications noted throughout the visualized vessels. Soft tissues are unremarkable. Normal bony mineralization. No lytic or blastic osseous lesion.  IMPRESSION: 1. No acute fracture, malalignment or joint effusion. 2. Moderate to advanced tricompartmental knee joint osteoarthritis worst in the lateral and patellofemoral compartments. 3. Chondrocalcinosis. 4. Atherosclerotic vascular calcifications.   Electronically Signed   By: Malachy Moan M.D.   On: 08/01/2013 12:54   Dg Knee Complete 4 Views Right  08/01/2013   CLINICAL DATA:  Fall, bilateral knee pain  EXAM: RIGHT KNEE - COMPLETE 4+ VIEW  COMPARISON:  Concurrently obtained radiographs of the contralateral left knee  FINDINGS: No acute fracture or malalignment. There is a trace effusion in the suprapatellar recess. Moderately advanced tricompartmental degenerative osteoarthritis most significant  in the patellofemoral and lateral compartments. Chondrocalcinosis is noted in both medial and lateral femoral compartments. Atherosclerotic calcifications noted throughout the visualized vessels. Soft tissues are unremarkable. Normal bony mineralization. No lytic or blastic osseous lesion.  IMPRESSION: No acute fracture, or malalignment.  Trace suprapatellar joint effusion is likely degenerative in etiology.  Moderate to advanced tricompartmental knee joint osteoarthritis worst in the lateral and patellofemoral compartments.  Chondrocalcinosis.  Atherosclerotic vascular calcifications.   Electronically Signed   By: Malachy Moan M.D.   On: 08/01/2013 12:55   Dg Hand Complete Right  08/01/2013   CLINICAL DATA:  Fall, right hand and wrist pain  EXAM: RIGHT HAND - COMPLETE 3+ VIEW  COMPARISON:  Concurrently obtained radiographs of the wrist  FINDINGS: No acute fracture or malalignment. Bones are mildly osteopenic. Scapholunate advanced collapse of the wrist better demonstrated on concurrently obtained wrist radiographs. Atherosclerotic vascular calcifications in the radial artery. Mild degenerative change in the distal interphalangeal joints.  IMPRESSION: 1. No acute fracture or malalignment. 2. Wrist changes better demonstrated on the concurrently obtained and separately dictated dedicated wrist radiographs. 3. Atherosclerotic vascular calcifications.   Electronically Signed   By: Malachy Moan M.D.   On: 08/01/2013 12:59   Dg Foot 2 Views Right  08/04/2013   CLINICAL DATA:  Recent fall. Right foot pain in great toe a radiating proximally.  EXAM: RIGHT FOOT - 2 VIEW  COMPARISON:  None.  FINDINGS: Examination of the right great toe on the DP radiograph is limited due to flexion of the toe at the MTP joint, with evaluation particularly limited of the distal phalanx. No definite acute fracture or dislocation is identified. Bones  are diffusely osteopenic. Vascular calcifications are noted. Small posterior  and plantar calcaneal enthesophytes are present. Soft tissue calcification is noted posterior to the distal tibia.  IMPRESSION: Limited examination of the great toe due to prominent dorsiflexion at the MTP joint. No definite acute fracture identified. If there is persistent clinical concern for fracture, dedicated three view radiographs of the great toe are recommended.   Electronically Signed   By: Sebastian Ache   On: 08/04/2013 13:33    Results/Tests Pending at Time of Discharge: none  Discharge Medications:    Medication List    STOP taking these medications       aspirin 325 MG tablet  Replaced by:  aspirin 81 MG chewable tablet      TAKE these medications       acetaminophen 325 MG tablet  Commonly known as:  TYLENOL  Take 2 tablets (650 mg total) by mouth every 6 (six) hours as needed for moderate pain.     amLODipine 10 MG tablet  Commonly known as:  NORVASC  Take 10 mg by mouth daily before breakfast.     aspirin 81 MG chewable tablet  Chew 1 tablet (81 mg total) by mouth daily.     furosemide 40 MG tablet  Commonly known as:  LASIX  Take 40 mg by mouth daily before breakfast.     ibuprofen 400 MG tablet  Commonly known as:  ADVIL,MOTRIN  Take 1 tablet (400 mg total) by mouth every 8 (eight) hours as needed for mild pain or moderate pain.     metoprolol 50 MG tablet  Commonly known as:  LOPRESSOR  Take 50 mg by mouth daily after breakfast.     NITROSTAT 0.4 MG SL tablet  Generic drug:  nitroGLYCERIN  PLACE 1 TABLET UNDER THE TONGUE EVERY 5 MINUTES AS NEEDED FOR CHEST PAIN. TAKE AS NEEDED PER CARDS     ramipril 10 MG capsule  Commonly known as:  ALTACE  Take 10 mg by mouth daily after breakfast.     simvastatin 40 MG tablet  Commonly known as:  ZOCOR  Take 40 mg by mouth daily before breakfast.     spironolactone 25 MG tablet  Commonly known as:  ALDACTONE  Take 25 mg by mouth daily before breakfast.        Discharge Instructions: Please refer to  Patient Instructions section of EMR for full details.  Patient was counseled important signs and symptoms that should prompt return to medical care, changes in medications, dietary instructions, activity restrictions, and follow up appointments.   Follow-Up Appointments:   Tawni Carnes, MD 08/04/2013, 2:20 PM PGY-1, [facility]

## 2013-08-03 NOTE — Progress Notes (Signed)
Family Medicine Teaching Service Daily Progress Note Intern Pager: [phone]  Patient name: Karina Willis Medical record number: [id number] Date of birth: [birth year] Age: 80 y.o. Gender: female  Primary Care Provider: Peter Swaziland, MD Consultants: none Code Status: Full  Assessment and Plan: Karina Willis is a 80 y.o. female presenting with a mechanical fall at home day PTA . PMH is significant for HTN, macrocytic anemia, HLD, mitral valve prolapse, chronic systolic CHF, CKD stage iii, osteoarthritis, CAD   #MSK: Presumed mechanical fall- no associated syncopal episode. Pt attesting to leg giving out on her. Ddx includes MSK (has known arthritis. XRays in the ED largely unremarkable) vs cardiogenic (does have known cardiac hx including CHF, CAD and mitral valve prolapse, no current CP) vs neurogenic (hx of TIAs on ASA) vs deconditioning (reports being independent in all ADLs and largely mobile around home) vs infection (no WBC, afebrile, u/a wnl) vs medication overdose (pinpoint pupils but no recent changes to meds). Suspect pain to cont to peak at days 3-4 before it improves  -up with assistance as pt able to tolerate  -PT/OT consults recommending SNF, but pt declines -HHPT/OT ordered -possible consult to SW for placement if needed although patient will likely refuse  -cont to place pt in wrist immobilizer   #CV: CAD, systolic CHF, mitral valve prolapse, HTN; does not appear to have cardiogenic etiology assc with above; sees Dr. Swaziland from cardiology. Unable to locate most recent outpt [facility] in [facility]. Pt euvolemic on admission; currently normotensive  -home meds include: ASA 325 (takes 2 tabs per day??), amlodipine 10mg , lasix 40mg , lopressor 50mg  BID, ramipril 10mg , zocor 20mg , spironolactone 25  -ASA 81mg  while in hospital  #Heme: known macrocytic anemia and VitB12 deficiency; possible component of neuropathy with vitamin deficiency increasing patient's risk for fall  -Iron panel: iron 43,  TIBC 293, Sat 15, Ferritin 69 -Hgb 11.4>10.0 -repeat CBC this morning  #Renal: CKD stage 3; Cr appears to currently be better than baseline 0.92  -trend Cr  -no indication for CK at this time   #FEN/GI: HHD, no IV access at this time  Prophylaxis: lovenox, up with assistance as pt tolerates   Disposition: pending discharge today  Subjective:  Thought she did well yesterday with getting out of bed and walking around with assistance. Complains of back pain this morning that she relates to sitting in a chair yesterday. She will not have 24/7 supervision at home. After having her stand up at the bedside she is more agreeable to looking into SNF options  Objective: Temp:  [98 F (36.7 C)-98.9 F (37.2 C)] 98.9 F (37.2 C) (05/26 0630) Pulse Rate:  [64-76] 75 (05/26 0630) Resp:  [16-18] 16 (05/26 0630) BP: (97-163)/(54-70) 97/62 mmHg (05/26 0630) SpO2:  [98 %-100 %] 98 % (05/26 0630) Weight:  [158 lb (71.668 kg)] 158 lb (71.668 kg) (05/25 1350) Physical Exam: General: NAD Cardiovascular: RRR, normal s1/s2, 2/6 systolic murmur Respiratory: CTAB, normal effort Abdomen: soft, NTND, bowel sounds normal Back: no ecchymosis noted, no point tenderness over spine Extremities: right wrist in splint, minimal movement of fingers limited by pain. No edema/cyanosis in LE. ACE bandage wrap over left knee. Neuro: alert and oriented, no focal deficits.  Laboratory:  Recent Labs Lab 08/01/13 1407 08/02/13 0430  WBC 10.3 7.6  HGB 11.4* 10.0*  HCT 34.8* 31.8*  PLT 168 149*    Recent Labs Lab 08/01/13 1407 08/02/13 0430  NA 138 139  K 5.0 4.5  CL 102 106  CO2 24 22  BUN 11 10  CREATININE 0.92 0.90  CALCIUM 9.9 9.2  GLUCOSE 105* 93    Imaging/Diagnostic Tests:   Tawni CarnesAndrew Ambrielle Kington, MD 08/03/2013, 8:11 AM PGY-1, [facility] FPTS Intern pager: [phone], text pages welcome

## 2013-08-03 NOTE — Progress Notes (Signed)
Family Medicine Teaching Service Attending Note  I interviewed and examined patient Karina Willis and reviewed their tests and x-rays.  I discussed with Dr. Waynetta Sandy and reviewed their note for today.  I agree with their assessment and plan.     Additionally  Alert conversant Able to sit on edge of bed but moves slowly and has diffuse pain  She agrees to Rehab until able to move better with less pain

## 2013-08-03 NOTE — Progress Notes (Signed)
I have read and agree with this note.   Time in/out:15:55-16:30 Total time:35 minutes  Ignacia Palma, OTR/L [phone]

## 2013-08-03 NOTE — Progress Notes (Signed)
Occupational Therapy Treatment Patient Details Name: Karina Willis MRN: [id number] DOB: September 09, 1932 Today's Date: 08/03/2013    History of present illness 80 y.o. female presenting with a mechanical fall at home day PTA . PMH is significant for HTN, macrocytic anemia, HLD, mitral valve prolapse, chronic systolic CHF, CKD stage iii, osteoarthritis, CAD    OT comments  Pt is a 80 yo female admitted with the above. Pt is making progress slowly due to pain in L knee and today in her R great toe (hyperextended position with no visual wound). Feels she cannot safely take care of herself at home.  Follow Up Recommendations  SNF    Equipment Recommendations   (TBD at next venue)       Precautions / Restrictions Precautions Precautions: Fall Required Braces or Orthoses:  (ace wrap around L knee, pt states it helps with the pain)       Mobility Bed Mobility Overal bed mobility: Needs Assistance Bed Mobility: Sidelying to Sit   Sidelying to sit: Min guard;HOB elevated    Transfers Overall transfer level: Needs assistance Equipment used: Rolling walker (2 wheeled) Transfers: Sit to/from Stand Sit to Stand: Mod assist         General transfer comment: Patient required Mod A to full upright standing. Pt received VC for correct hand placement and upright posture    Balance Overall balance assessment: Needs assistance Sitting-balance support: Feet supported;No upper extremity supported Sitting balance-Leahy Scale: Good     Standing balance support: Bilateral upper extremity supported Standing balance-Leahy Scale: Poor                     ADL Overall ADL's : Needs assistance/impaired     Grooming: Oral care;Sitting;Min guard Grooming Details (indicate cue type and reason): min guard for balance             Lower Body Dressing: Maximal assistance;Sitting/lateral leans Lower Body Dressing Details (indicate cue type and reason): pt attempted to take her sock off but  needed max A to do so             Functional mobility during ADLs: Rolling walker;Moderate assistance General ADL Comments: pt ambulated with increased time due to pain to the bathroom with min A using the rolling walker, she was able to brush her teeth with min guard A for safety    Exercises      Pt educated on and demonstrated how to don/doff wrist immobilizer so she could perform AROM exercises,       including wrist flexion/extension, forearm pronation/supination,and  finger flexion/extension/thumb opposition. Pt was       instructed to do these exercises as much as possible during the day.            Cognition   Behavior During Therapy: WFL for tasks assessed/performed Overall Cognitive Status: Within Functional Limits for tasks assessed                                         Pertinent Vitals/ Pain       Pt c/o of pain in L knee/leg, L side of abdomen and R great toe. Pt was repositioned and her legs were elevated in the chair.         Frequency Min 2X/week     Progress Toward Goals  OT Goals(current goals can now be found in the care  plan section)  Progress towards OT goals: Progressing toward goals  Acute Rehab OT Goals Patient Stated Goal: to get back home OT Goal Formulation: With patient Time For Goal Achievement: 08/09/13 Potential to Achieve Goals: Good  Plan Frequency remains appropriate       End of Session Equipment Utilized During Treatment: Gait belt;Rolling walker   Activity Tolerance Patient tolerated treatment well   Patient Left in chair;with call bell/phone within reach           Time:  -     Charges:    Maurene CapesWhitney Chanita Boden 08/03/2013, 5:47 PM

## 2013-08-03 NOTE — Progress Notes (Signed)
Physical Therapy Treatment Patient Details Name: Karina Willis MRN: [id number] DOB: [birth year] Today's Date: 08/03/2013    History of Present Illness 80 y.o. female presenting with a mechanical fall at home day PTA . PMH is significant for HTN, macrocytic anemia, HLD, mitral valve prolapse, chronic systolic CHF, CKD stage iii, osteoarthritis, CAD     PT Comments    Patient very limited by pain and soreness today. Patient stated she felt more stiff today with increased L knee pain. I did re-wrap patients left knee for comfort and she stated it did feel better. Patient now agreeable to SNF as she knows she can not manage at this level at home.   Follow Up Recommendations  SNF;Supervision/Assistance - 24 hour     Equipment Recommendations  Other (comment);Rolling walker with 5" wheels    Recommendations for Other Services       Precautions / Restrictions Precautions Precautions: Fall    Mobility  Bed Mobility   Bed Mobility: Sit to Supine       Sit to supine: Mod assist   General bed mobility comments: A for BLE back into bed and positioning once in the bed. Patient having difficult time getting adjusted in the bed  Transfers Overall transfer level: Needs assistance Equipment used: Rolling walker (2 wheeled) Transfers: Sit to/from Stand Sit to Stand: Mod assist         General transfer comment: Patient required Mod A to full upright standing this session. Patient with increased kyphotic posture in standing today. Patient cued for correct hand placement  Ambulation/Gait Ambulation/Gait assistance: Min assist Ambulation Distance (Feet): 3 Feet Assistive device: Rolling walker (2 wheeled) Gait Pattern/deviations: Step-to pattern;Shuffle;Decreased step length - right;Decreased step length - left Gait velocity: decreased   General Gait Details: Patient able to take small steps to the bed from the bsc. Patient cued for upright posture however continued to lean over RW  throughout stating she was very stiff today.    Stairs            Wheelchair Mobility    Modified Rankin (Stroke Patients Only)       Balance                                    Cognition Arousal/Alertness: Awake/alert Behavior During Therapy: WFL for tasks assessed/performed Overall Cognitive Status: Within Functional Limits for tasks assessed                      Exercises      General Comments        Pertinent Vitals/Pain 9/10 L knee pain. Patient also complained of R wrist pain and back pain. RN made aware and patient repositioned for comfort     Home Living                      Prior Function            PT Goals (current goals can now be found in the care plan section) Progress towards PT goals: Progressing toward goals    Frequency  Min 3X/week    PT Plan Current plan remains appropriate    Co-evaluation             End of Session Equipment Utilized During Treatment: Gait belt Activity Tolerance: Patient limited by pain Patient left: in bed;with bed alarm set     Time:  [id number] PT Time Calculation (min): 17 min  Charges:  $Therapeutic Activity: 8-22 mins                    G Codes:      Adline PotterJulia Elizabeth Rayane Gallardo 08/03/2013, 2:17 PM 08/03/2013 Adline PotterJulia Elizabeth Cherylee Rawlinson PTA [phone] pager [phone] office

## 2013-08-03 NOTE — Progress Notes (Signed)
Per day shift RN, pt received 500cc NS bolus due to being hypotensive early this afternoon. Bolus completed at 2000. BP 146/63 at 2044. Nursing will continue to monitor.

## 2013-08-04 ENCOUNTER — Telehealth: Payer: Self-pay | Admitting: Cardiology

## 2013-08-04 ENCOUNTER — Inpatient Hospital Stay (HOSPITAL_COMMUNITY): Payer: Medicare Other

## 2013-08-04 LAB — CBC
HEMATOCRIT: 33.4 % — AB (ref 36.0–46.0)
HEMOGLOBIN: 10.8 g/dL — AB (ref 12.0–15.0)
MCH: 33.2 pg (ref 26.0–34.0)
MCHC: 32.3 g/dL (ref 30.0–36.0)
MCV: 102.8 fL — ABNORMAL HIGH (ref 78.0–100.0)
Platelets: 168 10*3/uL (ref 150–400)
RBC: 3.25 MIL/uL — AB (ref 3.87–5.11)
RDW: 12.9 % (ref 11.5–15.5)
WBC: 9.9 10*3/uL (ref 4.0–10.5)

## 2013-08-04 LAB — URIC ACID: URIC ACID, SERUM: 6.3 mg/dL (ref 2.4–7.0)

## 2013-08-04 MED ORDER — BACLOFEN 5 MG HALF TABLET
5.0000 mg | ORAL_TABLET | Freq: Three times a day (TID) | ORAL | Status: DC
  Filled 2013-08-04 (×3): qty 1

## 2013-08-04 MED ORDER — IBUPROFEN 400 MG PO TABS
400.0000 mg | ORAL_TABLET | ORAL | Status: DC | PRN
  Filled 2013-08-04: qty 1

## 2013-08-04 MED ORDER — IBUPROFEN 400 MG PO TABS: 400.0000 mg | ORAL_TABLET | Freq: Three times a day (TID) | ORAL | Status: AC | PRN

## 2013-08-04 MED ORDER — ACETAMINOPHEN 325 MG PO TABS: 650.0000 mg | ORAL_TABLET | Freq: Four times a day (QID) | ORAL | Status: AC | PRN

## 2013-08-04 MED ORDER — ASPIRIN 81 MG PO CHEW: 81.0000 mg | CHEWABLE_TABLET | Freq: Every day | ORAL | Status: DC

## 2013-08-04 NOTE — Telephone Encounter (Signed)
Pt's niece (POA) wants to know if Dr Swaziland will right a letter that the patient needs one level apartment. She had a fall on Friday and is currently in the hospital @ [facility]. Please call the Enza the niece at [phone].

## 2013-08-04 NOTE — Progress Notes (Signed)
CSW (Clinical Child psychotherapist) prepared pt dc packet and placed with shadow chart. CSW arranged non-emergent ambulance transport. Pt, pt nurse, and facility informed. Pt informed CSW she would call her family and CSW did not need to do so. CSW signing off.   Zerina Hallinan, LCSWA [phone]

## 2013-08-04 NOTE — Telephone Encounter (Signed)
Returned call to patient's niece Ellenor.She stated patient fell recently and is being discharged from hospital today.Stated she would like a letter stating patient needs a first floor apartment with no steps.

## 2013-08-04 NOTE — Care Management Note (Signed)
CARE MANAGEMENT NOTE 08/04/2013  Patient:  Karina Willis, Karina Willis   Account Number:  [id number]  Date Initiated:  08/04/2013  Documentation initiated by:  Vance Peper  Subjective/Objective Assessment:   80 yr old female admitted with bilateral hip pain s/p fall. Patient has had multiple falls at home.     Action/Plan:   Case manager spoke with patient and neice concerning rehab at SNF. Patient is now agreeable. Case manager notified Social Worker of need for placement.   Anticipated DC Date:  08/04/2013   Anticipated DC Plan:  SKILLED NURSING FACILITY  In-house referral  Clinical Social Worker      DC Planning Services  CM consult      Choice offered to / List presented to:             Status of service:  Completed, signed off Medicare Important Message given?  NA - LOS <3 / Initial given by admissions (If response is "NO", the following Medicare IM given date fields will be blank) Date Medicare IM given:   Date Additional Medicare IM given:    Discharge Disposition:  SKILLED NURSING FACILITY

## 2013-08-04 NOTE — Progress Notes (Signed)
Family Medicine Teaching Service Daily Progress Note Intern Pager: [phone]  Patient name: Karina Willis Medical record number: [id number] Date of birth: [birth year] Age: 80 y.o. Gender: female  Primary Care Provider: Peter Swaziland, MD Consultants: none Code Status: Full  Assessment and Plan: Karina Willis is a 80 y.o. female presenting with a mechanical fall at home day PTA . PMH is significant for HTN, macrocytic anemia, HLD, mitral valve prolapse, chronic systolic CHF, CKD stage iii, osteoarthritis, CAD   #MSK: Presumed mechanical fall- no associated syncopal episode. Pt attesting to leg giving out on her.  -up with assistance as pt able to tolerate  -PT/OT consults recommending SNF, pt initially declines but unable to stand yesterday -CSW for SNF placement -Uric acid to eval for gout -Baclofen 5mg  TID for back and right great toe potential spasm.  #CV: CAD, systolic CHF, mitral valve prolapse, HTN; does not appear to have cardiogenic etiology assc with above; sees Dr. Swaziland from cardiology.  Pt euvolemic on admission; currently normotensive  -home meds include: ASA 325, amlodipine 10mg , lasix 40mg , lopressor 50mg  BID, ramipril 10mg , zocor 20mg , spironolactone 25  -ASA 81mg  while in hospital -hypotensive yesterday, received 500cc bolus and now BP 127/65 this AM  #Heme: known macrocytic anemia and VitB12 deficiency; possible component of neuropathy with vitamin deficiency increasing patient's risk for fall  -Iron panel: iron 43, TIBC 293, Sat 15, Ferritin 69 -Hgb 11.4>10.0>11.2  #Renal: CKD stage 3; Cr appears to currently be better than baseline 0.92  -trend Cr   #FEN/GI: HHD, no IV access at this time  Prophylaxis: lovenox, up with assistance as pt tolerates   Disposition: pending SNF placement  Subjective:  Complains of pain in her right big toe that started yesterday. She was out of bed yesterday a little bit but did not ambulate well. She still is agreeable to  SNF.  Objective: Temp:  [98.6 F (37 C)-99.8 F (37.7 C)] 99.8 F (37.7 C) (05/27 0515) Pulse Rate:  [73-78] 78 (05/27 0515) Resp:  [16-18] 18 (05/27 0515) BP: (80-146)/(49-65) 127/65 mmHg (05/27 0515) SpO2:  [98 %-100 %] 98 % (05/27 0515) Physical Exam: General: NAD Cardiovascular: RRR, normal s1/s2, 2/6 systolic murmur Respiratory: CTAB, normal effort Abdomen: soft, NTND, bowel sounds normal Back: no ecchymosis noted, no point tenderness over spine Extremities: right wrist in splint, improved ROM of fingers. No edema/cyanosis in LE. ACE bandage wrap over left knee. Right great toe is persistently flexed and painful to palpation or passive extension, toe is not warm/red. Neuro: alert and oriented, no focal deficits.  Laboratory:  Recent Labs Lab 08/01/13 1407 08/02/13 0430 08/03/13 1500  WBC 10.3 7.6 11.8*  HGB 11.4* 10.0* 11.2*  HCT 34.8* 31.8* 35.5*  PLT 168 149* 146*    Recent Labs Lab 08/01/13 1407 08/02/13 0430  NA 138 139  K 5.0 4.5  CL 102 106  CO2 24 22  BUN 11 10  CREATININE 0.92 0.90  CALCIUM 9.9 9.2  GLUCOSE 105* 93    Imaging/Diagnostic Tests:   Tawni Carnes, MD 08/04/2013, 7:32 AM PGY-1, [facility] Family Medicine FPTS Intern pager: [phone], text pages welcome

## 2013-08-04 NOTE — Progress Notes (Signed)
Clinical Social Work Department BRIEF PSYCHOSOCIAL ASSESSMENT 08/04/2013  Patient:  Karina Willis, Karina Willis     Account Number:  [id number]     Admit date:  08/01/2013  Clinical Social Worker:  Harless Nakayama  Date/Time:  08/04/2013 11:30 AM  Referred by:  Physician  Date Referred:  08/04/2013 Referred for  SNF Placement   Other Referral:   Interview type:  Patient Other interview type:   Spoke with pt and pt niece at bedside    PSYCHOSOCIAL DATA Living Status:  ALONE Admitted from facility:   Level of care:   Primary support name:  Waymon Amato [phone] Primary support relationship to patient:  FAMILY Degree of support available:   Pt has good support system    CURRENT CONCERNS Current Concerns  Post-Acute Placement   Other Concerns:    SOCIAL WORK ASSESSMENT / PLAN CSW aware of PT recommendation for SNF. CSW visited pt room and spoke with pt and pt niece at bedside. Pt informed CSW she was living alone prior to admission but her niece was acting as her caregiver. Pt niece reported she was helping pt with cleaning and shopping as needed. Pt reported she has not been to facility before but would like to dc to [facility] as it is closest for her family. Pt not wanting to consider other facilities at this time. Pt informed CSW she is ready to get to rehab so she can get therapy and return home as soon as possible.    CSW called facility to inform of pt preference.   Assessment/plan status:  Psychosocial Support/Ongoing Assessment of Needs Other assessment/ plan:   Information/referral to community resources:   SNF list denied at this time.    PATIENT'S/FAMILY'S RESPONSE TO PLAN OF CARE: Pt is wanting to dc to SNF       Harly Pipkins, LCSWA [phone]

## 2013-08-04 NOTE — Progress Notes (Signed)
Family Medicine Teaching Service Attending Note  I interviewed and examined patient Karina Willis and reviewed their tests and x-rays.  I discussed with Dr. Waynetta Sandy and reviewed their note for today.  I agree with their assessment and plan.     Additionally  Feels ok except now pain in R foot Agreeable to rehab at SNF Check xray of toe Start ibprufen low dose as needed for short period for musculoskeletal pain - has risks but less so than narcotics or muscle relaxers and pain is preventing her from moving

## 2013-08-04 NOTE — Progress Notes (Addendum)
Clinical Social Work Department CLINICAL SOCIAL WORK PLACEMENT NOTE 08/04/2013  Patient:  Karina Willis, Karina Willis  Account Number:  [id number] Admit date:  08/01/2013  Clinical Social Worker:  Sharol Harness, Theresia Majors  Date/time:  08/04/2013 11:00 AM  Clinical Social Work is seeking post-discharge placement for this patient at the following level of care:   SKILLED NURSING   (*CSW will update this form in Epic as items are completed)   08/04/2013  Patient/family provided with [facility] Department of Clinical Social Work's list of facilities offering this level of care within the geographic area requested by the patient (or if unable, by the patient's family).  08/04/2013  Patient/family informed of their freedom to choose among providers that offer the needed level of care, that participate in Medicare, Medicaid or managed care program needed by the patient, have an available bed and are willing to accept the patient.  08/04/2013  Patient/family informed of [facility]' ownership interest in [facility], as well as of the fact that they are under no obligation to receive care at this facility.  PASARR submitted to [employer] on 08/04/2013 PASARR number received from [employer] on 08/04/2013  FL2 transmitted to all facilities in geographic area requested by pt/family on  08/04/2013 FL2 transmitted to all facilities within larger geographic area on   Patient informed that his/her managed care company has contracts with or will negotiate with  certain facilities, including the following:     Patient/family informed of bed offers received:  08/04/2013 Patient chooses bed at [facility] Physician recommends and patient chooses bed at    Patient to be transferred to [facility] on  08/04/2013 Patient to be transferred to facility by [facility]  The following physician request were entered in Epic:   Additional Comments:   Bobbiejo Ishikawa, LCSWA [phone]

## 2013-08-05 NOTE — Discharge Summary (Signed)
I have reviewed this discharge summary and agree.    

## 2013-08-06 NOTE — Telephone Encounter (Signed)
Returned call to patient's niece Ellenor need to obtain letter from PCP.

## 2013-08-06 NOTE — Telephone Encounter (Signed)
Follow up      Have not heard anything on whether Dr Swaziland will write a note stating pt needs a first floor apartment.  Please call

## 2013-08-31 ENCOUNTER — Telehealth: Payer: Self-pay | Admitting: Cardiology

## 2013-08-31 ENCOUNTER — Telehealth: Payer: Self-pay

## 2013-08-31 NOTE — Telephone Encounter (Signed)
[facility].She stated patient fell recently.Stated [facility] and is being discharged.Patient listed Dr.Jordan as her Dr.and they will need Dr.Jordan to sign orders.Advised will need to have orders signed with PCP.Dr.Jordan is patient's cardiologist.

## 2013-08-31 NOTE — Telephone Encounter (Signed)
Pt would like to set up an Est. Care Visit.Pt is aware of Wait time for Appointment Pt has [employer] as insurance

## 2013-08-31 NOTE — Telephone Encounter (Signed)
New problem    Needing Dr SwazilandJordan to sign home health orders and follow pt. Please advise.

## 2013-09-07 ENCOUNTER — Telehealth: Payer: Self-pay | Admitting: Cardiology

## 2013-09-07 NOTE — Telephone Encounter (Signed)
Left voicemail for patient to call to establish care.  °

## 2013-10-05 ENCOUNTER — Ambulatory Visit: Payer: Medicare Other | Admitting: Cardiology

## 2013-11-16 ENCOUNTER — Telehealth: Payer: Self-pay | Admitting: Cardiology

## 2013-11-16 ENCOUNTER — Other Ambulatory Visit: Payer: Self-pay | Admitting: Cardiology

## 2013-11-16 NOTE — Telephone Encounter (Signed)
Pt says she need  A refill on all of her medicine,including her nitroglycerin. Please call to [facility]

## 2013-12-17 ENCOUNTER — Other Ambulatory Visit: Payer: Self-pay | Admitting: Cardiology

## 2014-02-18 ENCOUNTER — Other Ambulatory Visit: Payer: Self-pay | Admitting: Cardiology

## 2014-02-18 ENCOUNTER — Telehealth: Payer: Self-pay | Admitting: Physician Assistant

## 2014-02-18 MED ORDER — SPIRONOLACTONE 25 MG PO TABS: 25.0000 mg | ORAL_TABLET | Freq: Every day | ORAL | Status: DC

## 2014-02-18 MED ORDER — FUROSEMIDE 40 MG PO TABS: 40.0000 mg | ORAL_TABLET | Freq: Every day | ORAL | Status: DC

## 2014-02-18 MED ORDER — RAMIPRIL 10 MG PO CAPS: 10.0000 mg | ORAL_CAPSULE | Freq: Every day | ORAL | Status: DC

## 2014-02-18 MED ORDER — SIMVASTATIN 40 MG PO TABS: 40.0000 mg | ORAL_TABLET | Freq: Every day | ORAL | Status: DC

## 2014-02-18 MED ORDER — METOPROLOL TARTRATE 50 MG PO TABS: 50.0000 mg | ORAL_TABLET | Freq: Every day | ORAL | Status: DC

## 2014-02-18 MED ORDER — NITROGLYCERIN 0.4 MG SL SUBL: SUBLINGUAL_TABLET | SUBLINGUAL | Status: DC

## 2014-02-18 MED ORDER — AMLODIPINE BESYLATE 10 MG PO TABS: ORAL_TABLET | ORAL | Status: DC

## 2014-02-18 NOTE — Telephone Encounter (Signed)
Pt says she need all of her medicine. Would you please call her Metoprolol,Amlodipine,Ramipril,Furosemide,Spironolactne, and Nitoglycerin to [phone]. Please call them in today if possible.

## 2014-02-18 NOTE — Telephone Encounter (Signed)
Meds refilled for one month no refills.  Instructed to make appt with Dr. SwazilandJordan.  [name], [name]

## 2014-03-21 ENCOUNTER — Other Ambulatory Visit: Payer: Self-pay | Admitting: Physician Assistant

## 2014-03-28 ENCOUNTER — Other Ambulatory Visit: Payer: Self-pay | Admitting: Physician Assistant

## 2014-03-28 ENCOUNTER — Telehealth: Payer: Self-pay | Admitting: Cardiology

## 2014-03-28 NOTE — Telephone Encounter (Signed)
°  1. Which medications need to be refilled? Furosemide 40mg   2. Which pharmacy is medication to be sent to? [facility] on [address]   3. Do they need a 30 day or 90 day supply? 30     4. Would they like a call back once the medication has been sent to the pharmacy? no

## 2014-03-30 NOTE — Telephone Encounter (Signed)
Can this encounter be closed?

## 2014-04-28 ENCOUNTER — Other Ambulatory Visit: Payer: Self-pay | Admitting: Cardiology

## 2014-05-04 ENCOUNTER — Other Ambulatory Visit: Payer: Self-pay

## 2014-05-04 NOTE — Telephone Encounter (Signed)
Pt needs to call and schedule follow up office visit to receive further refills. Not seen since 2014. Called and left generic message on machine to call us back.

## 2014-05-10 ENCOUNTER — Other Ambulatory Visit: Payer: Self-pay | Admitting: Cardiology

## 2014-05-10 NOTE — Telephone Encounter (Signed)
°  1. Which medications need to be refilled? Spironolactone 25mg  tablets, Simvastatin 20mg   2. Which pharmacy is medication to be sent to?[facility] [address]   3. Do they need a 30 day or 90 day supply? 90  4. Would they like a call back once the medication has been sent to the pharmacy? No

## 2014-05-10 NOTE — Telephone Encounter (Signed)
Returned call to patient no answer.Left message on personal voice call me needs to schedule appointment with Dr.Jordan.Last office visit 11/18/2012.

## 2014-05-12 ENCOUNTER — Other Ambulatory Visit: Payer: Self-pay | Admitting: Cardiology

## 2014-05-16 MED ORDER — SIMVASTATIN 20 MG PO TABS: 20.0000 mg | ORAL_TABLET | Freq: Every day | ORAL | Status: DC

## 2014-05-16 MED ORDER — SPIRONOLACTONE 25 MG PO TABS: 25.0000 mg | ORAL_TABLET | Freq: Every day | ORAL | Status: DC

## 2014-05-16 NOTE — Telephone Encounter (Signed)
LMTCB

## 2014-05-16 NOTE — Telephone Encounter (Signed)
Rx(s) sent to pharmacy electronically. Refill instructions and notes to pharmacy indicate the need for an appointment for refills.  2 messages have been left for patient to return call.

## 2014-05-16 NOTE — Telephone Encounter (Signed)
Routed to Cheryl 

## 2014-05-16 NOTE — Telephone Encounter (Signed)
Patient's daughter Karina Willis called no answer.Unable to leave a message no answering machine.

## 2014-05-24 NOTE — Telephone Encounter (Signed)
Returned call to patient no answer.Left message on personal voice mail to call me back needs to schedule follow up appointment with Dr.Jordan.

## 2014-05-27 NOTE — Telephone Encounter (Signed)
Spoke to patient's friend Pinky she will have patient call office to schedule appointment with Dr.Jordan.

## 2014-07-11 ENCOUNTER — Telehealth: Payer: Self-pay | Admitting: Cardiology

## 2014-07-11 NOTE — Telephone Encounter (Signed)
Pt says she need all of her [facility]

## 2014-07-11 NOTE — Telephone Encounter (Signed)
New Message    Patient is calling for a refill of medication . Please give patient a call.

## 2014-07-12 ENCOUNTER — Other Ambulatory Visit: Payer: Self-pay

## 2014-07-12 MED ORDER — SPIRONOLACTONE 25 MG PO TABS: 25.0000 mg | ORAL_TABLET | Freq: Every day | ORAL | Status: DC

## 2014-07-12 MED ORDER — SIMVASTATIN 20 MG PO TABS: 20.0000 mg | ORAL_TABLET | Freq: Every day | ORAL | Status: DC

## 2014-07-12 MED ORDER — FUROSEMIDE 40 MG PO TABS: 40.0000 mg | ORAL_TABLET | Freq: Every day | ORAL | Status: DC

## 2014-07-12 MED ORDER — AMLODIPINE BESYLATE 10 MG PO TABS: ORAL_TABLET | ORAL | Status: DC

## 2014-07-12 MED ORDER — RAMIPRIL 10 MG PO CAPS: 10.0000 mg | ORAL_CAPSULE | Freq: Every day | ORAL | Status: DC

## 2014-07-12 MED ORDER — METOPROLOL TARTRATE 50 MG PO TABS: ORAL_TABLET | ORAL | Status: DC

## 2014-07-25 ENCOUNTER — Other Ambulatory Visit: Payer: Self-pay | Admitting: Cardiology

## 2014-07-25 ENCOUNTER — Ambulatory Visit: Payer: Medicaid Other | Admitting: Cardiology

## 2014-07-25 ENCOUNTER — Other Ambulatory Visit: Payer: Self-pay | Admitting: Physician Assistant

## 2014-07-25 MED ORDER — NITROGLYCERIN 0.4 MG SL SUBL: 0.4000 mg | SUBLINGUAL_TABLET | SUBLINGUAL | Status: DC | PRN

## 2014-07-25 NOTE — Telephone Encounter (Signed)
Rx(s) sent to pharmacy electronically.  

## 2014-07-25 NOTE — Telephone Encounter (Signed)
The office will need to call the pharmacy for her refills.  Patient states she needs all of the medications refilled.

## 2014-07-28 ENCOUNTER — Other Ambulatory Visit: Payer: Self-pay

## 2014-07-28 MED ORDER — SIMVASTATIN 20 MG PO TABS: 20.0000 mg | ORAL_TABLET | Freq: Every day | ORAL | Status: DC

## 2014-08-14 ENCOUNTER — Encounter (HOSPITAL_COMMUNITY): Payer: Self-pay | Admitting: Emergency Medicine

## 2014-08-14 ENCOUNTER — Inpatient Hospital Stay (HOSPITAL_COMMUNITY): Payer: Medicare Other

## 2014-08-14 ENCOUNTER — Inpatient Hospital Stay (HOSPITAL_COMMUNITY)
Admission: EM | Admit: 2014-08-14 | Discharge: 2014-08-16 | DRG: 683 | Disposition: A | Discharge status: Skilled Nursing Facility | Payer: Medicare Other | Attending: Internal Medicine | Admitting: Internal Medicine

## 2014-08-14 ENCOUNTER — Emergency Department (HOSPITAL_COMMUNITY): Payer: Medicare Other

## 2014-08-14 ENCOUNTER — Observation Stay (HOSPITAL_COMMUNITY): Payer: Medicare Other

## 2014-08-14 DIAGNOSIS — L03032 Cellulitis of left toe: Secondary | ICD-10-CM | POA: Diagnosis not present

## 2014-08-14 DIAGNOSIS — S90932A Unspecified superficial injury of left great toe, initial encounter: Secondary | ICD-10-CM | POA: Diagnosis not present

## 2014-08-14 DIAGNOSIS — N179 Acute kidney failure, unspecified: Secondary | ICD-10-CM | POA: Diagnosis not present

## 2014-08-14 DIAGNOSIS — D539 Nutritional anemia, unspecified: Secondary | ICD-10-CM | POA: Diagnosis not present

## 2014-08-14 DIAGNOSIS — I340 Nonrheumatic mitral (valve) insufficiency: Secondary | ICD-10-CM | POA: Diagnosis not present

## 2014-08-14 DIAGNOSIS — Z7982 Long term (current) use of aspirin: Secondary | ICD-10-CM

## 2014-08-14 DIAGNOSIS — Z87891 Personal history of nicotine dependence: Secondary | ICD-10-CM

## 2014-08-14 DIAGNOSIS — I255 Ischemic cardiomyopathy: Secondary | ICD-10-CM | POA: Diagnosis present

## 2014-08-14 DIAGNOSIS — L089 Local infection of the skin and subcutaneous tissue, unspecified: Secondary | ICD-10-CM

## 2014-08-14 DIAGNOSIS — E785 Hyperlipidemia, unspecified: Secondary | ICD-10-CM | POA: Diagnosis not present

## 2014-08-14 DIAGNOSIS — Z9114 Patient's other noncompliance with medication regimen: Secondary | ICD-10-CM | POA: Diagnosis not present

## 2014-08-14 DIAGNOSIS — E440 Moderate protein-calorie malnutrition: Secondary | ICD-10-CM | POA: Diagnosis present

## 2014-08-14 DIAGNOSIS — M6281 Muscle weakness (generalized): Secondary | ICD-10-CM | POA: Diagnosis not present

## 2014-08-14 DIAGNOSIS — I2510 Atherosclerotic heart disease of native coronary artery without angina pectoris: Secondary | ICD-10-CM | POA: Diagnosis present

## 2014-08-14 DIAGNOSIS — B351 Tinea unguium: Secondary | ICD-10-CM | POA: Diagnosis not present

## 2014-08-14 DIAGNOSIS — B9689 Other specified bacterial agents as the cause of diseases classified elsewhere: Secondary | ICD-10-CM | POA: Diagnosis not present

## 2014-08-14 DIAGNOSIS — D519 Vitamin B12 deficiency anemia, unspecified: Secondary | ICD-10-CM | POA: Diagnosis not present

## 2014-08-14 DIAGNOSIS — E538 Deficiency of other specified B group vitamins: Secondary | ICD-10-CM | POA: Diagnosis present

## 2014-08-14 DIAGNOSIS — I272 Other secondary pulmonary hypertension: Secondary | ICD-10-CM | POA: Diagnosis present

## 2014-08-14 DIAGNOSIS — R112 Nausea with vomiting, unspecified: Secondary | ICD-10-CM | POA: Diagnosis not present

## 2014-08-14 DIAGNOSIS — R404 Transient alteration of awareness: Secondary | ICD-10-CM | POA: Diagnosis not present

## 2014-08-14 DIAGNOSIS — I959 Hypotension, unspecified: Secondary | ICD-10-CM | POA: Diagnosis not present

## 2014-08-14 DIAGNOSIS — M79675 Pain in left toe(s): Secondary | ICD-10-CM | POA: Diagnosis not present

## 2014-08-14 DIAGNOSIS — R079 Chest pain, unspecified: Secondary | ICD-10-CM | POA: Diagnosis not present

## 2014-08-14 DIAGNOSIS — Z8673 Personal history of transient ischemic attack (TIA), and cerebral infarction without residual deficits: Secondary | ICD-10-CM | POA: Diagnosis not present

## 2014-08-14 DIAGNOSIS — I5022 Chronic systolic (congestive) heart failure: Secondary | ICD-10-CM | POA: Diagnosis not present

## 2014-08-14 DIAGNOSIS — M1990 Unspecified osteoarthritis, unspecified site: Secondary | ICD-10-CM | POA: Diagnosis not present

## 2014-08-14 DIAGNOSIS — M7989 Other specified soft tissue disorders: Secondary | ICD-10-CM | POA: Diagnosis not present

## 2014-08-14 DIAGNOSIS — M810 Age-related osteoporosis without current pathological fracture: Secondary | ICD-10-CM | POA: Diagnosis not present

## 2014-08-14 DIAGNOSIS — I509 Heart failure, unspecified: Secondary | ICD-10-CM | POA: Diagnosis not present

## 2014-08-14 DIAGNOSIS — E861 Hypovolemia: Secondary | ICD-10-CM | POA: Diagnosis present

## 2014-08-14 DIAGNOSIS — Z880 Allergy status to penicillin: Secondary | ICD-10-CM

## 2014-08-14 DIAGNOSIS — R110 Nausea: Secondary | ICD-10-CM

## 2014-08-14 DIAGNOSIS — L97529 Non-pressure chronic ulcer of other part of left foot with unspecified severity: Secondary | ICD-10-CM | POA: Diagnosis not present

## 2014-08-14 DIAGNOSIS — L03119 Cellulitis of unspecified part of limb: Secondary | ICD-10-CM | POA: Diagnosis not present

## 2014-08-14 DIAGNOSIS — R531 Weakness: Secondary | ICD-10-CM | POA: Diagnosis not present

## 2014-08-14 DIAGNOSIS — Z9049 Acquired absence of other specified parts of digestive tract: Secondary | ICD-10-CM | POA: Diagnosis present

## 2014-08-14 DIAGNOSIS — I10 Essential (primary) hypertension: Secondary | ICD-10-CM | POA: Diagnosis present

## 2014-08-14 DIAGNOSIS — N289 Disorder of kidney and ureter, unspecified: Secondary | ICD-10-CM | POA: Diagnosis not present

## 2014-08-14 DIAGNOSIS — E780 Pure hypercholesterolemia: Secondary | ICD-10-CM | POA: Diagnosis not present

## 2014-08-14 DIAGNOSIS — E860 Dehydration: Secondary | ICD-10-CM

## 2014-08-14 DIAGNOSIS — T148XXA Other injury of unspecified body region, initial encounter: Secondary | ICD-10-CM

## 2014-08-14 DIAGNOSIS — M150 Primary generalized (osteo)arthritis: Secondary | ICD-10-CM | POA: Diagnosis not present

## 2014-08-14 DIAGNOSIS — R7303 Prediabetes: Secondary | ICD-10-CM | POA: Diagnosis present

## 2014-08-14 DIAGNOSIS — I071 Rheumatic tricuspid insufficiency: Secondary | ICD-10-CM | POA: Diagnosis not present

## 2014-08-14 LAB — BRAIN NATRIURETIC PEPTIDE: B Natriuretic Peptide: 99.9 pg/mL (ref 0.0–100.0)

## 2014-08-14 LAB — CBC WITH DIFFERENTIAL/PLATELET
BASOS ABS: 0 10*3/uL (ref 0.0–0.1)
Basophils Relative: 0 % (ref 0–1)
EOS ABS: 0 10*3/uL (ref 0.0–0.7)
EOS PCT: 0 % (ref 0–5)
HEMATOCRIT: 32.6 % — AB (ref 36.0–46.0)
Hemoglobin: 10.7 g/dL — ABNORMAL LOW (ref 12.0–15.0)
Lymphocytes Relative: 9 % — ABNORMAL LOW (ref 12–46)
Lymphs Abs: 1 10*3/uL (ref 0.7–4.0)
MCH: 33.4 pg (ref 26.0–34.0)
MCHC: 32.8 g/dL (ref 30.0–36.0)
MCV: 101.9 fL — ABNORMAL HIGH (ref 78.0–100.0)
MONO ABS: 0.9 10*3/uL (ref 0.1–1.0)
Monocytes Relative: 8 % (ref 3–12)
NEUTROS PCT: 83 % — AB (ref 43–77)
Neutro Abs: 8.5 10*3/uL — ABNORMAL HIGH (ref 1.7–7.7)
PLATELETS: 162 10*3/uL (ref 150–400)
RBC: 3.2 MIL/uL — AB (ref 3.87–5.11)
RDW: 13.6 % (ref 11.5–15.5)
WBC: 10.3 10*3/uL (ref 4.0–10.5)

## 2014-08-14 LAB — CBG MONITORING, ED: GLUCOSE-CAPILLARY: 74 mg/dL (ref 65–99)

## 2014-08-14 LAB — URINALYSIS W MICROSCOPIC (NOT AT ARMC)
Bilirubin Urine: NEGATIVE
Glucose, UA: NEGATIVE mg/dL
KETONES UR: NEGATIVE mg/dL
NITRITE: NEGATIVE
Protein, ur: NEGATIVE mg/dL
Specific Gravity, Urine: 1.009 (ref 1.005–1.030)
Urobilinogen, UA: 1 mg/dL (ref 0.0–1.0)
pH: 5 (ref 5.0–8.0)

## 2014-08-14 LAB — MRSA PCR SCREENING: MRSA by PCR: NEGATIVE

## 2014-08-14 LAB — COMPREHENSIVE METABOLIC PANEL
ALK PHOS: 54 U/L (ref 38–126)
ALT: 7 U/L — AB (ref 14–54)
AST: 10 U/L — AB (ref 15–41)
Albumin: 3 g/dL — ABNORMAL LOW (ref 3.5–5.0)
Anion gap: 11 (ref 5–15)
BILIRUBIN TOTAL: 0.5 mg/dL (ref 0.3–1.2)
BUN: 49 mg/dL — ABNORMAL HIGH (ref 6–20)
CO2: 21 mmol/L — ABNORMAL LOW (ref 22–32)
CREATININE: 1.9 mg/dL — AB (ref 0.44–1.00)
Calcium: 9.2 mg/dL (ref 8.9–10.3)
Chloride: 100 mmol/L — ABNORMAL LOW (ref 101–111)
GFR, EST AFRICAN AMERICAN: 28 mL/min — AB (ref >60)
GFR, EST NON AFRICAN AMERICAN: 24 mL/min — AB (ref >60)
GLUCOSE: 100 mg/dL — AB (ref 65–99)
POTASSIUM: 4.8 mmol/L (ref 3.5–5.1)
Sodium: 132 mmol/L — ABNORMAL LOW (ref 135–145)
TOTAL PROTEIN: 7.1 g/dL (ref 6.5–8.1)

## 2014-08-14 LAB — MAGNESIUM: Magnesium: 2.2 mg/dL (ref 1.7–2.4)

## 2014-08-14 LAB — I-STAT CG4 LACTIC ACID, ED: Lactic Acid, Venous: 1.38 mmol/L (ref 0.5–2.0)

## 2014-08-14 LAB — PHOSPHORUS: PHOSPHORUS: 3.4 mg/dL (ref 2.5–4.6)

## 2014-08-14 LAB — VITAMIN B12: VITAMIN B 12: 161 pg/mL — AB (ref 180–914)

## 2014-08-14 LAB — LIPASE, BLOOD: LIPASE: 55 U/L — AB (ref 22–51)

## 2014-08-14 LAB — TROPONIN I: Troponin I: 0.03 ng/mL (ref <0.031)

## 2014-08-14 MED ORDER — SODIUM CHLORIDE 0.9 % IV SOLN
INTRAVENOUS | Status: DC
  Administered 2014-08-14: 17:00:00 via INTRAVENOUS

## 2014-08-14 MED ORDER — SODIUM CHLORIDE 0.9 % IV BOLUS (SEPSIS)
2000.0000 mL | Freq: Once | INTRAVENOUS | Status: AC
  Administered 2014-08-14: 2000 mL via INTRAVENOUS

## 2014-08-14 MED ORDER — ONDANSETRON HCL 4 MG/2ML IJ SOLN
4.0000 mg | Freq: Four times a day (QID) | INTRAMUSCULAR | Status: DC | PRN
  Administered 2014-08-15: 4 mg via INTRAVENOUS
  Filled 2014-08-14 (×2): qty 2

## 2014-08-14 MED ORDER — TETANUS-DIPHTH-ACELL PERTUSSIS 5-2.5-18.5 LF-MCG/0.5 IM SUSP
0.5000 mL | Freq: Once | INTRAMUSCULAR | Status: AC
  Administered 2014-08-14: 0.5 mL via INTRAMUSCULAR
  Filled 2014-08-14: qty 0.5

## 2014-08-14 MED ORDER — SODIUM CHLORIDE 0.9 % IJ SOLN
3.0000 mL | Freq: Two times a day (BID) | INTRAMUSCULAR | Status: DC
  Administered 2014-08-15 – 2014-08-16 (×2): 3 mL via INTRAVENOUS

## 2014-08-14 MED ORDER — HEPARIN SODIUM (PORCINE) 5000 UNIT/ML IJ SOLN
5000.0000 [IU] | Freq: Three times a day (TID) | INTRAMUSCULAR | Status: DC
  Administered 2014-08-14 – 2014-08-16 (×6): 5000 [IU] via SUBCUTANEOUS
  Filled 2014-08-14 (×6): qty 1

## 2014-08-14 MED ORDER — CEFAZOLIN SODIUM 1-5 GM-% IV SOLN
1.0000 g | Freq: Three times a day (TID) | INTRAVENOUS | Status: DC
  Administered 2014-08-14 – 2014-08-16 (×6): 1 g via INTRAVENOUS
  Filled 2014-08-14 (×8): qty 50

## 2014-08-14 MED ORDER — BOOST / RESOURCE BREEZE PO LIQD
1.0000 | Freq: Three times a day (TID) | ORAL | Status: DC
  Administered 2014-08-14: 1 via ORAL

## 2014-08-14 MED ORDER — FOLIC ACID 1 MG PO TABS
1.0000 mg | ORAL_TABLET | Freq: Every day | ORAL | Status: DC
  Administered 2014-08-15: 1 mg via ORAL
  Filled 2014-08-14 (×2): qty 1

## 2014-08-14 MED ORDER — SODIUM CHLORIDE 0.9 % IV BOLUS (SEPSIS): 500.0000 mL | Freq: Once | INTRAVENOUS | Status: DC

## 2014-08-14 MED ORDER — SIMVASTATIN 20 MG PO TABS
20.0000 mg | ORAL_TABLET | Freq: Every day | ORAL | Status: DC
  Administered 2014-08-14 – 2014-08-15 (×2): 20 mg via ORAL
  Filled 2014-08-14 (×3): qty 1

## 2014-08-14 MED ORDER — SODIUM CHLORIDE 0.9 % IV BOLUS (SEPSIS): 1000.0000 mL | Freq: Once | INTRAVENOUS | Status: DC

## 2014-08-14 MED ORDER — ASPIRIN 81 MG PO CHEW
81.0000 mg | CHEWABLE_TABLET | Freq: Every day | ORAL | Status: DC
  Administered 2014-08-15 – 2014-08-16 (×2): 81 mg via ORAL
  Filled 2014-08-14 (×2): qty 1

## 2014-08-14 MED ORDER — CEFAZOLIN SODIUM 1-5 GM-% IV SOLN: 1.0000 g | Freq: Once | INTRAVENOUS | Status: DC

## 2014-08-14 MED ORDER — ACETAMINOPHEN 325 MG PO TABS
650.0000 mg | ORAL_TABLET | Freq: Four times a day (QID) | ORAL | Status: DC | PRN
  Administered 2014-08-14 – 2014-08-15 (×2): 650 mg via ORAL
  Filled 2014-08-14 (×2): qty 2

## 2014-08-14 NOTE — ED Provider Notes (Signed)
complains of generalized weakness for the past 3 days. She's had no oral intake in the past 2 days. She tried to eat chicken soup yesterday but vomited. Chest complains of left great toe pain for one month. She reports she injured her left great toe on a door. On exam patient is alert nontoxic HEENT exam extremities dry neck supple trachea midline lungs clear auscultation heart regular rate and rhythm left lower extremity great toe is reddened, tender. All toenails with chronic appearing heaped up areas consistent with fungal infection  Doug SouSam Dhani Dannemiller, MD 08/14/14 1704

## 2014-08-14 NOTE — H&P (Signed)
Date: 08/14/2014               Patient Name:  Karina Willis MRN: [id number]  DOB: 01/12/1933 Age / Sex: 81 y.o., female   PCP: Peter M SwazilandJordan, MD         Medical Service: Internal Medicine Teaching Service         Attending Physician: Dr. Earl LagosNischal Narendra, MD    First Contact: Dr. Tasia CatchingsAhmed Pager: [phone]  Second Contact: Dr. Johna Rolesabbani Pager: [phone]       After Hours (After 5p/  First Contact Pager: [phone]  weekends / holidays): Second Contact Pager: [phone]   Chief Complaint: n/v, malaise, left great toe injury,  History of Present Illness:   81 yo female with CAD, systolic CHF, severe MR, HTN, HLD here with n/v for 1 week. Shortly after she eats something, she vomits the foot content. No pain associated with the emesis. She can tolerate liquids including diet soda and coffee but no solid food or even soups. Last tried eating yesterday and vomitted. Did not eat anything today other than having her coffee. Intake has been low for 1 week. Has mild generalized abdominal pain when hungry which is relieved after she eats but then vomits. No blood in the vomit. No dysuria, diarrhea, fever, chills. No sick contacts or others with similar symptoms.   Despite having n/v and poor PO intake, has been taking her lasix,spironolactone and also all of her other meds.   Denies any hx of gall stones or gall bladder problems. Denies hx of previous cscope or endoscopy. Used to drink heavily but quit 30+ years ago, no hx of ulcers.   She also injured left great toe on a door. Redness, foul smell, and erythema on that toe along with significant pain. Has been cleaning with alcohol but has not seen a doctor for this. Has onychomycosis on toe nails chronically.   Supposed to be getting her meds from Dr. SwazilandJordan but last office visit with Dr. Elvis CoilJordan's office 11/2012.   Want to establish PCP at [facility], used to go to family medicine.  Meds: Current Facility-Administered Medications  Medication Dose Route  Frequency Provider Last Rate Last Dose  . 0.9 %  sodium chloride infusion   Intravenous Continuous Marjan Rabbani, MD 75 mL/hr at 08/14/14 1728    . acetaminophen (TYLENOL) tablet 650 mg  650 mg Oral Q6H PRN Otis BraceMarjan Rabbani, MD      . aspirin chewable tablet 81 mg  81 mg Oral Daily Marjan Rabbani, MD      . ceFAZolin (ANCEF) IVPB 1 g/50 mL premix  1 g Intravenous 3 times per day Otis BraceMarjan Rabbani, MD   1 g at 08/14/14 1900  . folic acid (FOLVITE) tablet 1 mg  1 mg Oral Daily Marjan Rabbani, MD      . heparin injection 5,000 Units  5,000 Units Subcutaneous 3 times per day Otis BraceMarjan Rabbani, MD      . ondansetron (ZOFRAN) injection 4 mg  4 mg Intravenous Q6H PRN Marjan Rabbani, MD      . simvastatin (ZOCOR) tablet 20 mg  20 mg Oral q1800 Marjan Rabbani, MD   20 mg at 08/14/14 1859  . sodium chloride 0.9 % injection 3 mL  3 mL Intravenous Q12H Otis BraceMarjan Rabbani, MD        Allergies: Allergies as of 08/14/2014 - Review Complete 08/14/2014  Allergen Reaction Noted  . Penicillins Hives and Itching 07/30/2010   Past Medical History  Diagnosis Date  .  CHF (congestive heart failure)   . Systolic dysfunction     CHRONIC, EF 25-30%  . Mitral insufficiency     SEVERE  . Coronary artery disease 2001    WITH DOCUMENTED TOTAL OCCLUSION OF THE RIGHT CORONARY AND THE LEFT CIRCUMFLEX CORONARY  . SOB (shortness of breath)   . Hypertension   . Hyperlipidemia   . History of TIAs   . Arthritis   . Non compliance w medication regimen   . Neck pain    Past Surgical History  Procedure Laterality Date  . Cardiac catheterization  06/05/1999    ENLARGED LEFT VENTRICULAR SIZE. THERE IS AKINESIA OF THE INFERIOR BASE. LEFT VENTRICULAR  FUNCTIONS APPEAR TO BE MODERATELY REDUCED WITH EF 35-40%  . Appendectomy    . Left arm surgery     Family History  Problem Relation Age of Onset  . Heart attack Mother   . Heart attack Father   . Heart disease Sister   . Heart attack Brother   . Heart attack Brother   . Heart  attack Brother   . Heart disease Sister    History   Social History  . Marital Status: Divorced    Spouse Name: N/A  . Number of Children: 0  . Years of Education: N/A   Occupational History  . waitress     retired   Social History Main Topics  . Smoking status: Former Smoker    Types: Cigarettes    Quit date: 08/21/1983  . Smokeless tobacco: Never Used  . Alcohol Use: No  . Drug Use: No  . Sexual Activity: Not on file   Other Topics Concern  . Not on file   Social History Narrative    Review of Systems: Review of Systems  Constitutional: Positive for malaise/fatigue. Negative for fever, chills and diaphoresis.  HENT: Negative for congestion, nosebleeds and sore throat.   Eyes: Negative for photophobia and redness.  Respiratory: Negative for cough, shortness of breath and wheezing.   Cardiovascular: Negative for chest pain, orthopnea and leg swelling.  Gastrointestinal: Positive for nausea and vomiting. Negative for heartburn, abdominal pain, diarrhea, blood in stool and melena.  Genitourinary: Negative for dysuria and hematuria.  Musculoskeletal: Negative for myalgias, falls and neck pain.       Left great toe pain and injury  Skin: Negative for itching and rash.  Neurological: Negative for dizziness, seizures, loss of consciousness and weakness.  Endo/Heme/Allergies: Negative.   Psychiatric/Behavioral: Negative for depression and suicidal ideas.     Physical Exam: Blood pressure 125/40, pulse 64, temperature 97.8 F (36.6 C), temperature source Oral, resp. rate 18, height 4\' 11"  (1.499 m), weight 125 lb 3.5 oz (56.8 kg), SpO2 100 %. Physical Exam  Constitutional: She is oriented to person, place, and time. She appears well-developed and well-nourished. No distress.  HENT:  Head: Normocephalic and atraumatic.  Mouth/Throat: No oropharyngeal exudate.  Dry oral mucosa  Eyes: Conjunctivae are normal. Pupils are equal, round, and reactive to light. Right eye  exhibits no discharge. Left eye exhibits no discharge. No scleral icterus.  Neck: Normal range of motion. No JVD present.  Cardiovascular: Normal rate and regular rhythm.  Exam reveals no gallop and no friction rub.   No murmur heard. Respiratory: Effort normal and breath sounds normal. No respiratory distress. She has no wheezes. She exhibits no tenderness.  GI: Soft. Bowel sounds are normal. She exhibits no distension and no mass. There is no tenderness. There is no rebound and no guarding.  Musculoskeletal: She exhibits tenderness. She exhibits no edema.  Pulses intact b/l lower extremities.  Left great toe has superficial skin laceration around the nail base with some fluctuance on the plantar surface. Has significant tenderness all around her left great toe, mostly on the dorsum. No drainage noted. Some foul smell noted.  No pedal edema.  Has severe onychomycosis on all toes bilaterally.   Neurological: She is alert and oriented to person, place, and time. No cranial nerve deficit. Coordination normal.  Skin: She is not diaphoretic.       Lab results: Basic Metabolic Panel:  Recent Labs  16/10/96 1253  NA 132*  K 4.8  CL 100*  CO2 21*  GLUCOSE 100*  BUN 49*  CREATININE 1.90*  CALCIUM 9.2   Liver Function Tests:  Recent Labs  08/14/14 1253  AST 10*  ALT 7*  ALKPHOS 54  BILITOT 0.5  PROT 7.1  ALBUMIN 3.0*   CBC:  Recent Labs  08/14/14 1253  WBC 10.3  NEUTROABS 8.5*  HGB 10.7*  HCT 32.6*  MCV 101.9*  PLT 162   Cardiac Enzymes:  Recent Labs  08/14/14 1253  TROPONINI 0.03   CBG:  Recent Labs  08/14/14 1309  GLUCAP 74  Urine Drug Screen: Drugs of Abuse     Component Value Date/Time   LABOPIA NONE DETECTED 08/02/2013 0926   COCAINSCRNUR NONE DETECTED 08/02/2013 0926   LABBENZ NONE DETECTED 08/02/2013 0926   AMPHETMU NONE DETECTED 08/02/2013 0926   THCU NONE DETECTED 08/02/2013 0926   LABBARB NONE DETECTED 08/02/2013 0926      Imaging  results:  Dg Chest 2 View  08/14/2014   CLINICAL DATA:  Lethargy, weakness, generalized chest pain  EXAM: CHEST  2 VIEW  COMPARISON:  None available  FINDINGS: Several skin folds overlie the chest bilaterally. Marked cardiomegaly without edema or CHF. No significant effusion or pneumothorax. Lungs remain clear. Mild hyperinflation evident. No focal pneumonia, collapse or consolidation. Trachea is midline. Minor degenerative changes and scoliosis of the spine. Aortic and coronary atherosclerosis evident.  IMPRESSION: Cardiomegaly without CHF or pneumonia  Mild hyperinflation   Electronically Signed   By: Judie Petit.  Shick M.D.   On: 08/14/2014 14:12   US Abdomen Complete  08/14/2014   CLINICAL DATA:  Nausea and vomiting for 2 days, history CHF, coronary artery disease, hypertension  EXAM: ULTRASOUND ABDOMEN COMPLETE  COMPARISON:  None  FINDINGS: Gallbladder: Normally distended without stones or wall thickening.  No pericholecystic fluid or sonographic Murphy sign.  Common bile duct: Diameter: Normal caliber 2 mm diameter  Liver: Normal appearance  IVC: Normal appearance  Pancreas: Normal appearance  Spleen: Normal appearance, 4.9 cm length  Right Kidney: Length: 9.0 cm length. Cortical thinning. Increased cortical echogenicity. No mass or hydronephrosis. No shadowing calcification.  Left Kidney: Length: 10.7 cm. Normal cortical thickness. Increased cortical echogenicity. No mass, hydronephrosis or shadowing calcification.  Abdominal aorta: Normal caliber  Other findings: No free-fluid  IMPRESSION: Medical renal disease changes of both kidneys.  Otherwise negative exam.   Electronically Signed   By: Ulyses Southward M.D.   On: 08/14/2014 17:16   Dg Abd 2 Views  08/14/2014   CLINICAL DATA:  Progressive weakness. Postprandial nausea and vomiting.  EXAM: ABDOMEN - 2 VIEW  COMPARISON:  Chest x-ray of the same day.  FINDINGS: Heart is enlarged. Lung bases are clear. The bowel gas pattern is normal. There is no obstruction or free  air. Soft tissue granulomas are noted over the abdomen. No  fluid levels are present. Degenerative changes are present in the lower lumbar spine, worse on the right. Vascular calcifications are noted.  IMPRESSION: 1. Normal bowel gas pattern. 2. Atherosclerosis. 3. Cardiomegaly without failure.   Electronically Signed   By: Marin Roberts M.D.   On: 08/14/2014 14:11   Dg Foot Complete Left  08/14/2014   CLINICAL DATA:  Trauma to great toe 1 week ago with pain and older.  EXAM: Three views of the left foot.  COMPARISON:  None.  FINDINGS: Marked soft tissue swelling is present in the great toe. There is gas within the soft tissues and ulceration about the nail bed. The distal phalanx is intact. No osseous erosion is present. Moderate osteopenia is noted. Calcifications are noted along the Achilles tendon.  IMPRESSION: 1. Soft tissue ulceration at the level of the nail bed with extensive soft tissue swelling in the great toe. 2. No acute or focal associated osseous abnormality. 3. Moderate osteopenia   Electronically Signed   By: Marin Roberts M.D.   On: 08/14/2014 14:09    Other results: EKG: NSR, 68 rate, qtc 453.   Assessment & Plan by Problem: Active Problems:   AKI (acute kidney injury)   Hypovolemia  81 yo female with systolic CHF, CAD, hx of HTN, here with Left great toe cellulitis and N/V.  Left great toe moderate cellulitis - no fever or white count. Had hypotension initially which improved with 2L fluid boluses. Does have foul smell and fluctuance on exam Xray shows only soft tissue. Will get MRI to assess for osteomyelitis or abscess. - obtain bcx. will treat with ancef for now. Will consult the ortho if MRI shows any osteo or abscess. - otherwise if MRI is negative and patient remains stable, will switch to PO abx tomorrow - wound care consult  N/V postprandial- likely 2/2 to infection. Without diarrhea or fever. No ab pain on exam.  Could also be viral gastro but no sick  contacts or diarrhea.  Other etiologies include PUD (pain better with eating but then vomits) , symptomatic gall stone (al though no pain, fever, or LFT increase), mesenteric ischemia (but pain is less with eating),   mainly after eating, going on for 1 week. No past EGD or cscope per patient.  - Ab Xray neg. Will get abd ultrasound to look for other causes. -zofran for nausea  AKI - b/l 0.9, now 1.9. likely 2/2 to dehydration from poor PO intake due to N/V while still taking diuretics - s/p 2L fluid. Hold diuretics and AceI. Be careful with fluids given CHF - cont fluid 75cc/hr with caution as she does have severe CHF. - will check UA and FEUrea. Monitor BMET.  Systolic CHF with severe MR. (ischemic cardiomyopathy) - reported EF 20-35%, on many medications. Volume down, BNP 100.  - no ECHO in chart, will obtain one. Follows with Dr. Swazil[place] outpatient.  - home meds include norvasc  daily, lasix  daily, metoprolol  only once day, ramipril  daily, and spironolactone  daily. Also on asa  daily. - cont asa  daily for now. Hold norvasc, lasix, metprolol, spironolactone, and rampiril with AKI and volume depletion  CAD - stable. No anginal symptoms EKG unchanged, trops neg. LHC 05/1999 showed mid LAD 50%, ostial ccx 80%, 99% prior to OM1, then cocluded, proximal RCA occlusion, EF 35-40%. EF then has been 25-30%. not candidate for revasc per Dr. Elvis Coil note 2012. Cont medical therapy - cont asa and lipitor. Hold rest for now.  Chronic macrocytic anemia -b/l hgb 10-11. Now at baseline. 10.7.  folate was intermediate and B12 was low normal in the past, iron and ferritin also normal.  - will check B12 and MMA and folic acid  Hx of HTN -now hypotensive, hold hypertension meds.  HLD - cont zocor  Diet: npo for now. May advance if tolerates later.  Dispo: Disposition is deferred at this time, awaiting improvement of current medical problems. Anticipated discharge in  approximately 1-2 day(s).   The patient does have a current PCP (Peter M Swaziland, MD) and does need an [facility] hospital follow-up appointment after discharge.  The patient does have transportation limitations that hinder transportation to clinic appointments.  Signed: Hyacinth Meeker, MD 08/14/2014, 7:18 PM

## 2014-08-14 NOTE — ED Notes (Signed)
Tasha on 3E notified that pt transported to US per admitting request. Transport to bring pt to 3E when finished.

## 2014-08-14 NOTE — ED Notes (Addendum)
Per EMS: pt from home for eval of increased weakness x1 week with n/v only after trying to eat. Pt also reports hitting her toe x1 week ago and now is having left toe pain, EMS noted foul odor to left great toe.Pt states she has been having generalized cp that decreases when she takes home nitro, states no radiation but has sob. Denies any cp at this time. nad noted, pt denies any fevers at home. Pt alert and oriented.

## 2014-08-14 NOTE — ED Notes (Signed)
Patient transported to Ultrasound 

## 2014-08-14 NOTE — ED Notes (Signed)
Admitting at bedside, to be transported to US after assessment of admitting.

## 2014-08-14 NOTE — ED Notes (Signed)
Pt transported to xray 

## 2014-08-14 NOTE — ED Provider Notes (Signed)
CSN: [id number]     Arrival date & time 08/14/14  1216 History   First MD Initiated Contact with Patient 08/14/14 1223     Chief Complaint  Patient presents with  . Weakness  . Toe Pain     (Consider location/radiation/quality/duration/timing/severity/associated sxs/prior Treatment) The history is provided by the patient. No language interpreter was used.  Karina Willis is an 81 y/o F with PMHx of CHF, HTN, mitral insufficiency, arthritis, TIA, cardiac cath in 2001 presenting to the ED with weakness, decreased eating, nausea and vomiting for approximately one week. Niece, who accompanies patient, reported that patient has episodes of emesis after eating. Patient reported that she's been able to keep down diet soda. Reported that she's been feeling fatigued and tired. Reported that she's been also experiencing intermittent chest pain with associated shortness of breath, reported that she took her last nitroglycerin yesterday evening. Reported that she's been making urine without difficulty and has had normal bowel movements, last bowel movement was yesterday. Stated that a week ago she hit her left great toe on the door as been having pain, swelling, and a malodorous smell from this region. Stated that she's been placing alcohol on the left great toe with worsening symptoms-has noticed increased swelling. Denied fever, chills, difficulty breathing, fainting, decreased urination, dysuria, hematuria, melena, hematochezia, drainage from the foot, red streaks, cough, nasal congestion. PCP Dr. Sw[place]iland  Past Medical History  Diagnosis Date  . CHF (congestive heart failure)   . Systolic dysfunction     CHRONIC, EF 25-30%  . Mitral insufficiency     SEVERE  . Coronary artery disease 2001    WITH DOCUMENTED TOTAL OCCLUSION OF THE RIGHT CORONARY AND THE LEFT CIRCUMFLEX CORONARY  . SOB (shortness of breath)   . Hypertension   . Hyperlipidemia   . History of TIAs   . Arthritis   . Non compliance w  medication regimen   . Neck pain    Past Surgical History  Procedure Laterality Date  . Cardiac catheterization  06/05/1999    ENLARGED LEFT VENTRICULAR SIZE. THERE IS AKINESIA OF THE INFERIOR BASE. LEFT VENTRICULAR  FUNCTIONS APPEAR TO BE MODERATELY REDUCED WITH EF 35-40%  . Appendectomy    . Left arm surgery     Family History  Problem Relation Age of Onset  . Heart attack Mother   . Heart attack Father   . Heart disease Sister   . Heart attack Brother   . Heart attack Brother   . Heart attack Brother   . Heart disease Sister    History  Substance Use Topics  . Smoking status: Former Smoker    Types: Cigarettes    Quit date: 08/21/1983  . Smokeless tobacco: Never Used  . Alcohol Use: No   OB History    No data available     Review of Systems  Constitutional: Positive for fatigue. Negative for fever.  Respiratory: Positive for shortness of breath. Negative for chest tightness.   Cardiovascular: Positive for chest pain.  Gastrointestinal: Positive for nausea and vomiting. Negative for abdominal pain, diarrhea, constipation, blood in stool and anal bleeding.  Genitourinary: Negative for dysuria and hematuria.  Musculoskeletal: Positive for arthralgias (left great toe). Negative for back pain, neck pain and neck stiffness.  Skin: Positive for color change (left great toe) and wound (left great toe).  Neurological: Negative for dizziness, weakness and headaches.      Allergies  Penicillins  Home Medications   Prior to Admission medications  Medication Sig Start Date End Date Taking? Authorizing Provider  acetaminophen (TYLENOL) 325 MG tablet Take 2 tablets (650 mg total) by mouth every 6 (six) hours as needed for moderate pain. 08/04/13   Nani Ravens, MD  amLODipine (NORVASC) 10 MG tablet Take 1 tablet (10 mg total) by mouth daily. MUST KEEP APPOINTMENT 10/31/14 WITH DR Swaziland FOR FUTURE REFILLS 07/25/14   Peter M Sw[place], MD  aspirin 81 MG chewable tablet Chew 1  tablet (81 mg total) by mouth daily. 08/04/13   Nani Ravens, MD  furosemide (LASIX) 40 MG tablet Take 1 tablet (40 mg total) by mouth daily. MUST KEEP APPOINTMENT 10/31/14 WITH DR Swaziland FOR FUTURE REFILLS 07/25/14   Peter M [place], MD  metoprolol (LOPRESSOR) 50 MG tablet Take 1 tablet (50 mg total) by mouth daily. MUST KEEP APPOINTMENT 10/31/14 WITH DR Swaziland FOR FUTURE REFILLS 07/25/14   Peter M Swaz[place]and, MD  nitroGLYCERIN (NITROSTAT) 0.4 MG SL tablet Place 1 tablet (0.4 mg total) under the tongue every 5 (five) minutes as needed for chest pain. Must keep appointment 10/31/14 with Dr Swaziland 07/25/14   Peter M Sw[place], MD  ramipril (ALTACE) 10 MG capsule Take 1 capsule (10 mg total) by mouth daily. MUST KEEP APPOINTMENT 10/31/14 WITH DR Swaziland FOR FUTURE REFILLS 07/25/14   Peter M [place], MD  simvastatin (ZOCOR) 20 MG tablet Take 1 tablet (20 mg total) by mouth daily at 6 PM. 07/28/14   Peter M [place], MD  spironolactone (ALDACTONE) 25 MG tablet Take 1 tablet (25 mg total) by mouth daily. MUST KEEP APPOINTMENT 10/31/14 WITH DR Swaziland FOR FUTURE REFILLS 07/25/14   Peter M [place], MD   BP 115/55 mmHg  Pulse 66  Temp(Src) 98 F (36.7 C) (Rectal)  Resp 17  SpO2 100% Physical Exam  Constitutional: She is oriented to person, place, and time. She appears well-developed and well-nourished. No distress.  Thin, frail-appearing elderly female  HENT:  Head: Normocephalic and atraumatic.  Dry mucous membranes  Eyes: Conjunctivae and EOM are normal. Pupils are equal, round, and reactive to light. Right eye exhibits no discharge. Left eye exhibits no discharge.  Neck: Normal range of motion. Neck supple. No tracheal deviation present.  Cardiovascular: Normal rate, regular rhythm and normal heart sounds.  Exam reveals no friction rub.   No murmur heard. Pulses:      Radial pulses are 2+ on the right side, and 2+ on the left side.       Dorsalis pedis pulses are 2+ on the right side, and 2+ on the left side.  Cap  refill < 3 seconds Negative swelling or pitting edema noted to the lower extremities bilaterally   Pulmonary/Chest: Effort normal and breath sounds normal. No respiratory distress. She has no wheezes. She has no rales.  Patient able to speak in full sentences without difficulty  Negative use of accessory muscles Negative stridor  Abdominal: Soft. Bowel sounds are normal. She exhibits no distension. There is no tenderness. There is no rebound and no guarding.  Musculoskeletal: She exhibits edema and tenderness.  Swelling, erythema, warmth upon palpation, clear liquid drainage and malodorous left great toe identified from the previous wound. Negative necrotic tissue identified. Tenderness upon palpation circumferentially to the left great toe and base of the left great toe. Negative red streaks noted. Patient has decreased range of motion to the left great toe secondary to pain. Full range of motion to the left ankle, knee, hip at the left lower extremity.  Lymphadenopathy:  She has no cervical adenopathy.  Neurological: She is alert and oriented to person, place, and time. No cranial nerve deficit. She exhibits normal muscle tone. Coordination normal. GCS eye subscore is 4. GCS verbal subscore is 5. GCS motor subscore is 6.  Patient follows commands well Patient response to questions appropriately Cranial nerves III through XII grossly intact Negative facial droop Negative slurred speech Negative aphasia  Skin: Skin is warm and dry. No rash noted. She is not diaphoretic. No erythema.  Psychiatric: She has a normal mood and affect. Her behavior is normal. Thought content normal.  Nursing note and vitals reviewed.   ED Course  Procedures (including critical care time) Labs Review Labs Reviewed  CBC WITH DIFFERENTIAL/PLATELET - Abnormal; Notable for the following:    RBC 3.20 (*)    Hemoglobin 10.7 (*)    HCT 32.6 (*)    MCV 101.9 (*)    Neutrophils Relative % 83 (*)    Neutro Abs 8.5  (*)    Lymphocytes Relative 9 (*)    All other components within normal limits  COMPREHENSIVE METABOLIC PANEL - Abnormal; Notable for the following:    Sodium 132 (*)    Chloride 100 (*)    CO2 21 (*)    Glucose, Bld 100 (*)    BUN 49 (*)    Creatinine, Ser 1.90 (*)    Albumin 3.0 (*)    AST 10 (*)    ALT 7 (*)    GFR calc non Af Amer 24 (*)    GFR calc Af Amer 28 (*)    All other components within normal limits  TROPONIN I  BRAIN NATRIURETIC PEPTIDE  CBG MONITORING, ED  I-STAT CG4 LACTIC ACID, ED    Imaging Review Dg Chest 2 View  08/14/2014   CLINICAL DATA:  Lethargy, weakness, generalized chest pain  EXAM: CHEST  2 VIEW  COMPARISON:  None available  FINDINGS: Several skin folds overlie the chest bilaterally. Marked cardiomegaly without edema or CHF. No significant effusion or pneumothorax. Lungs remain clear. Mild hyperinflation evident. No focal pneumonia, collapse or consolidation. Trachea is midline. Minor degenerative changes and scoliosis of the spine. Aortic and coronary atherosclerosis evident.  IMPRESSION: Cardiomegaly without CHF or pneumonia  Mild hyperinflation   Electronically Signed   By: Judie PetitM.  Shick M.D.   On: 08/14/2014 14:12   Dg Abd 2 Views  08/14/2014   CLINICAL DATA:  Progressive weakness. Postprandial nausea and vomiting.  EXAM: ABDOMEN - 2 VIEW  COMPARISON:  Chest x-ray of the same day.  FINDINGS: Heart is enlarged. Lung bases are clear. The bowel gas pattern is normal. There is no obstruction or free air. Soft tissue granulomas are noted over the abdomen. No fluid levels are present. Degenerative changes are present in the lower lumbar spine, worse on the right. Vascular calcifications are noted.  IMPRESSION: 1. Normal bowel gas pattern. 2. Atherosclerosis. 3. Cardiomegaly without failure.   Electronically Signed   By: Marin Robertshristopher  Mattern M.D.   On: 08/14/2014 14:11   Dg Foot Complete Left  08/14/2014   CLINICAL DATA:  Trauma to great toe 1 week ago with pain and  older.  EXAM: Three views of the left foot.  COMPARISON:  None.  FINDINGS: Marked soft tissue swelling is present in the great toe. There is gas within the soft tissues and ulceration about the nail bed. The distal phalanx is intact. No osseous erosion is present. Moderate osteopenia is noted. Calcifications are noted along the Achilles  tendon.  IMPRESSION: 1. Soft tissue ulceration at the level of the nail bed with extensive soft tissue swelling in the great toe. 2. No acute or focal associated osseous abnormality. 3. Moderate osteopenia   Electronically Signed   By: Marin Roberts M.D.   On: 08/14/2014 14:09     EKG Interpretation   Date/Time:  Sunday August 14 2014 12:20:41 EDT Ventricular Rate:  68 PR Interval:  154 QRS Duration: 83 QT Interval:  426 QTC Calculation: 453 R Axis:   24 Text Interpretation:  Sinus rhythm Low voltage, extremity leads No  significant change since last tracing Confirmed by Ethelda Chick  MD, SAM  [phone]) on 08/14/2014 12:57:55 PM       1:59 PM Dr. Rennis Chris on assess patient, agreed to plan of admission. Patient dehydrated.  2:27 PM This provider spoke with Dr. Johna Roles, IM resident. Discussed case, labs, imaging, ED course in great detail. Patient to be admitted under the care of Dr. Heide Spark to Telemetry bed.   MDM   Final diagnoses:  Nausea  Dehydration  AKI (acute kidney injury)  Hypotension, unspecified hypotension type  Cellulitis of toe of left foot    Medications  ceFAZolin (ANCEF) IVPB 1 g/50 mL premix (not administered)  Tdap (BOOSTRIX) injection 0.5 mL (0.5 mLs Intramuscular Given 08/14/14 1409)  sodium chloride 0.9 % bolus 2,000 mL (2,000 mLs Intravenous New Bag/Given 08/14/14 1409)    Filed Vitals:   08/14/14 1315 08/14/14 1415 08/14/14 1430 08/14/14 1455  BP: 109/69 120/60 84/61 115/55  Pulse: 67 63 64 66  Temp:      TempSrc:      Resp: SpO2: 99% 100% 100% 100%   EKG noted sinus rhythm with no change since last  tracing. Troponin negative elevated white blood cell count. CBC negative elevated leukocytosis. Hemoglobin 10.7, hematocrit 32.6 - when compared to previous labs patient's hemoglobin and hematocrit have been this way, at baseline. CMP noted increasing creatinine, when compared to one year ago patient's creatinine was 0.90, has increased today at 1.90 with an elevated BUN of 49. Mild hyponatremia of 132. CBG 74. Lactic acid negative elevation. Chest x-ray noted cardiomegaly without CHF or pneumonia, mild hyperinflation identified. Left foot plain film soft tissue ulceration at the level of the nailbed with extensive soft tissue swelling in the great toe. No focal or acute osseous abnormalities. Abdominal plain films noted normal bowel gas pattern. Negative findings of acute abdominal processes. Negative findings of osteomyelitis identified to left foot-suspicion high for cellulitis - patient given IV antibiotics in the ED setting. Negative elevated leukocytosis, afebrile, not Tachycardic-doubt sepsis. Concerns regarding low blood pressure-patient given aggressive IV hydration secondary to dehydration. Patient appears to be in acute kidney injury secondary to dehydration. Patient also experiencing intermittent chest pain - will need serial troponins Discussed case in great detail with internal medicine teaching services. Patient to be admitted to telemetry for regarding acute kidney injury, dehydration, cellulitis of left toe. Discussed plan for admission with patient in great detail who is in accordance to plan of care.  Raymon Mutton, PA-C 08/14/14 1501  Doug Sou, MD 08/14/14 1705

## 2014-08-14 NOTE — ED Notes (Signed)
Pt CBG 74. 

## 2014-08-14 NOTE — ED Notes (Signed)
Unable to get labs with IV start, phlebotomy notified.

## 2014-08-15 ENCOUNTER — Inpatient Hospital Stay (HOSPITAL_COMMUNITY): Payer: Medicare Other

## 2014-08-15 DIAGNOSIS — L03032 Cellulitis of left toe: Secondary | ICD-10-CM

## 2014-08-15 DIAGNOSIS — I959 Hypotension, unspecified: Secondary | ICD-10-CM

## 2014-08-15 DIAGNOSIS — E440 Moderate protein-calorie malnutrition: Secondary | ICD-10-CM | POA: Diagnosis present

## 2014-08-15 DIAGNOSIS — B351 Tinea unguium: Secondary | ICD-10-CM

## 2014-08-15 DIAGNOSIS — I340 Nonrheumatic mitral (valve) insufficiency: Secondary | ICD-10-CM

## 2014-08-15 DIAGNOSIS — R112 Nausea with vomiting, unspecified: Secondary | ICD-10-CM

## 2014-08-15 DIAGNOSIS — E538 Deficiency of other specified B group vitamins: Secondary | ICD-10-CM | POA: Diagnosis present

## 2014-08-15 DIAGNOSIS — R7303 Prediabetes: Secondary | ICD-10-CM | POA: Diagnosis present

## 2014-08-15 DIAGNOSIS — I255 Ischemic cardiomyopathy: Secondary | ICD-10-CM

## 2014-08-15 DIAGNOSIS — N179 Acute kidney failure, unspecified: Principal | ICD-10-CM

## 2014-08-15 DIAGNOSIS — B9689 Other specified bacterial agents as the cause of diseases classified elsewhere: Secondary | ICD-10-CM

## 2014-08-15 DIAGNOSIS — I5020 Unspecified systolic (congestive) heart failure: Secondary | ICD-10-CM

## 2014-08-15 DIAGNOSIS — I2510 Atherosclerotic heart disease of native coronary artery without angina pectoris: Secondary | ICD-10-CM

## 2014-08-15 DIAGNOSIS — Z7982 Long term (current) use of aspirin: Secondary | ICD-10-CM

## 2014-08-15 DIAGNOSIS — D509 Iron deficiency anemia, unspecified: Secondary | ICD-10-CM

## 2014-08-15 DIAGNOSIS — I509 Heart failure, unspecified: Secondary | ICD-10-CM

## 2014-08-15 DIAGNOSIS — E785 Hyperlipidemia, unspecified: Secondary | ICD-10-CM

## 2014-08-15 LAB — BASIC METABOLIC PANEL
Anion gap: 13 (ref 5–15)
BUN: 40 mg/dL — ABNORMAL HIGH (ref 6–20)
CO2: 19 mmol/L — AB (ref 22–32)
Calcium: 8.8 mg/dL — ABNORMAL LOW (ref 8.9–10.3)
Chloride: 102 mmol/L (ref 101–111)
Creatinine, Ser: 1.41 mg/dL — ABNORMAL HIGH (ref 0.44–1.00)
GFR calc Af Amer: 40 mL/min — ABNORMAL LOW (ref >60)
GFR calc non Af Amer: 34 mL/min — ABNORMAL LOW (ref >60)
GLUCOSE: 75 mg/dL (ref 65–99)
POTASSIUM: 4.7 mmol/L (ref 3.5–5.1)
Sodium: 134 mmol/L — ABNORMAL LOW (ref 135–145)

## 2014-08-15 LAB — HEMOGLOBIN A1C
Hgb A1c MFr Bld: 5.9 % — ABNORMAL HIGH (ref 4.8–5.6)
MEAN PLASMA GLUCOSE: 123 mg/dL

## 2014-08-15 MED ORDER — HYDROCERIN EX CREA
TOPICAL_CREAM | Freq: Two times a day (BID) | CUTANEOUS | Status: DC
  Administered 2014-08-15 – 2014-08-16 (×3): via TOPICAL
  Filled 2014-08-15: qty 113

## 2014-08-15 MED ORDER — OXYCODONE-ACETAMINOPHEN 5-325 MG PO TABS
1.0000 | ORAL_TABLET | ORAL | Status: DC | PRN
  Administered 2014-08-15 – 2014-08-16 (×4): 1 via ORAL
  Filled 2014-08-15 (×4): qty 1

## 2014-08-15 MED ORDER — PANTOPRAZOLE SODIUM 40 MG PO TBEC
40.0000 mg | DELAYED_RELEASE_TABLET | Freq: Every day | ORAL | Status: DC
  Administered 2014-08-15 – 2014-08-16 (×2): 40 mg via ORAL
  Filled 2014-08-15 (×2): qty 1

## 2014-08-15 MED ORDER — OXYCODONE-ACETAMINOPHEN 5-325 MG PO TABS
1.0000 | ORAL_TABLET | Freq: Once | ORAL | Status: AC
  Administered 2014-08-15: 1 via ORAL
  Filled 2014-08-15: qty 1

## 2014-08-15 MED ORDER — SODIUM CHLORIDE 0.9 % IV BOLUS (SEPSIS)
250.0000 mL | Freq: Once | INTRAVENOUS | Status: AC
  Administered 2014-08-15: 250 mL via INTRAVENOUS

## 2014-08-15 MED ORDER — VITAMIN B-12 1000 MCG PO TABS
1000.0000 ug | ORAL_TABLET | Freq: Every day | ORAL | Status: DC
  Administered 2014-08-15 – 2014-08-16 (×2): 1000 ug via ORAL
  Filled 2014-08-15 (×3): qty 1

## 2014-08-15 MED ORDER — ENSURE ENLIVE PO LIQD
237.0000 mL | Freq: Two times a day (BID) | ORAL | Status: DC
  Administered 2014-08-15 – 2014-08-16 (×4): 237 mL via ORAL

## 2014-08-15 MED ORDER — OXYCODONE-ACETAMINOPHEN 5-325 MG PO TABS
1.0000 | ORAL_TABLET | Freq: Once | ORAL | Status: AC | PRN
  Administered 2014-08-15: 1 via ORAL
  Filled 2014-08-15: qty 1

## 2014-08-15 NOTE — Progress Notes (Signed)
Patient seen and examined. Case d/w residents in detail.  In brief, patient is a 81 y/o female with PMH of CAD, chronic systolic CHF, severe MR, HTN who p/w n/v  * 1 week. She denies abd pain and diarrhea and is able to tolerate only liquids. No hematemesis. She also notes left great toe pain with foul smelling discharge and difficulty ambulating secondary to pain. She cleans it with alcohol but has not followed with a doctor yet. No fevers/chills.  Physical Exam: Gen: AAO*3, NAD CVS: RRR, normal heart sounds Lungs: CTA b/l Abd: soft, non tender, BS + Ext: left great toe with laceration around nail base with foul smell and tenderness to palpation. Severe onychomycosis +  Assessment and Plan: 81 y/o female with left great toe cellulitis  Left great toe cellulitis: - MRI with no evidence of abscess or osteomyelitis - Will c/w ancef for now. Will transition to PO keflex in AM - c/w pain control - Outpatient f/u with podiatry for onychomycosis and nail care  AKI: - likely pre renal. Improving with IVF. - Will monitor BMET - hold diuretics and ACE-I for now  N/v: - likely secondary to infection. Will advance diet as tolerated and treat underlying infection - n/v improved today. C/w zofran prn

## 2014-08-15 NOTE — Progress Notes (Signed)
Subjective:  No acute events overnight. States that her toe is still in pain. Not reporting any other complaints this morning. No abdominal pain or nausea with clears.   Objective: Vital signs in last 24 hours: Filed Vitals:   08/14/14 1716 08/14/14 2116 08/15/14 0100 08/15/14 0601  BP: 125/40 132/64 101/61 108/58  Pulse: 64 63 67 70  Temp: 97.8 F (36.6 C) 98.3 F (36.8 C) 98.1 F (36.7 C) 98 F (36.7 C)  TempSrc: Oral Oral Oral Oral  Resp: Height:  (1.499 m)     Weight: 125 lb 3.5 oz (56.8 kg)   122 lb 2.2 oz (55.4 kg)  SpO2: 100% 93% 100% 97%   Weight change:   Intake/Output Summary (Last 24 hours) at 08/15/14 1143 Last data filed at 08/15/14 1000  Gross per 24 hour  Intake   2645 ml  Output   1250 ml  Net   1395 ml   Physical Exam   Vitals reviewed. General: resting in bed,in some distress due to pain. HEENT: PERRL, EOMI, no scleral icterus Cardiac: RRR, no rubs, murmurs or gallops Pulm: clear to auscultation bilaterally, no wheezes, rales, or rhonchi Abd: soft, nontender, nondistended, BS present Ext: Pulses intact b/l lower extremities. Left great toe has superficial skin laceration around the nail base with some fluctuance on the plantar surface. Has significant tenderness all around her left great toe, mostly on the dorsum. No drainage noted. Some foul smell noted. No pedal edema. Has severe onychomycosis on all toes bilaterally.  Neuro: alert and oriented X3, cranial nerves II-XII grossly intact, strength and sensation to light touch equal in bilateral upper and lower extremities  Lab Results: Basic Metabolic Panel:  Recent Labs Lab 08/14/14 1253 08/14/14 1815 08/15/14 0332  NA 132*  --  134*  K 4.8  --  4.7  CL 100*  --  102  CO2 21*  --  19*  GLUCOSE 100*  --  75  BUN 49*  --  40*  CREATININE 1.90*  --  1.41*  CALCIUM 9.2  --  8.8*  MG  --  2.2  --   PHOS  --  3.4  --    Liver Function Tests:  Recent Labs Lab  08/14/14 1253  AST 10*  ALT 7*  ALKPHOS 54  BILITOT 0.5  PROT 7.1  ALBUMIN 3.0*    Recent Labs Lab 08/14/14 1815  LIPASE 55*   No results for input(s): AMMONIA in the last 168 hours. CBC:  Recent Labs Lab 08/14/14 1253  WBC 10.3  NEUTROABS 8.5*  HGB 10.7*  HCT 32.6*  MCV 101.9*  PLT 162   Cardiac Enzymes:  Recent Labs Lab 08/14/14 1253  TROPONINI 0.03   BNP: No results for input(s): PROBNP in the last 168 hours. D-Dimer: No results for input(s): DDIMER in the last 168 hours. CBG:  Recent Labs Lab 08/14/14 1309  GLUCAP 74   Hemoglobin A1C:  Recent Labs Lab 08/14/14 1815  HGBA1C 5.9*  Anemia Panel:  Recent Labs Lab 08/14/14 1815  VITAMINB12 161*   Urine Drug Screen: Drugs of Abuse     Component Value Date/Time   LABOPIA NONE DETECTED 08/02/2013 0926   COCAINSCRNUR NONE DETECTED 08/02/2013 0926   LABBENZ NONE DETECTED 08/02/2013 0926   AMPHETMU NONE DETECTED 08/02/2013 0926   THCU NONE DETECTED 08/02/2013 0926   LABBARB NONE DETECTED 08/02/2013 0926    Alcohol Level: No results for input(s): ETH in the last  168 hours. Urinalysis:  Recent Labs Lab 08/14/14 1505  COLORURINE YELLOW  LABSPEC 1.009  PHURINE 5.0  GLUCOSEU NEGATIVE  HGBUR MODERATE*  BILIRUBINUR NEGATIVE  KETONESUR NEGATIVE  PROTEINUR NEGATIVE  UROBILINOGEN 1.0  NITRITE NEGATIVE  LEUKOCYTESUR MODERATE*    Micro Results: Recent Results (from the past 240 hour(s))  MRSA PCR Screening     Status: None   Collection Time: 08/14/14  6:32 PM  Result Value Ref Range Status   MRSA by PCR NEGATIVE NEGATIVE Final    Comment:        The GeneXpert MRSA Assay (FDA approved for NASAL specimens only), is one component of a comprehensive MRSA colonization surveillance program. It is not intended to diagnose MRSA infection nor to guide or monitor treatment for MRSA infections.    Studies/Results: Dg Chest 2 View  08/14/2014   CLINICAL DATA:  Lethargy, weakness,  generalized chest pain  EXAM: CHEST  2 VIEW  COMPARISON:  None available  FINDINGS: Several skin folds overlie the chest bilaterally. Marked cardiomegaly without edema or CHF. No significant effusion or pneumothorax. Lungs remain clear. Mild hyperinflation evident. No focal pneumonia, collapse or consolidation. Trachea is midline. Minor degenerative changes and scoliosis of the spine. Aortic and coronary atherosclerosis evident.  IMPRESSION: Cardiomegaly without CHF or pneumonia  Mild hyperinflation   Electronically Signed   By: Judie Petit.  Shick M.D.   On: 08/14/2014 14:12   US Abdomen Complete  08/14/2014   CLINICAL DATA:  Nausea and vomiting for 2 days, history CHF, coronary artery disease, hypertension  EXAM: ULTRASOUND ABDOMEN COMPLETE  COMPARISON:  None  FINDINGS: Gallbladder: Normally distended without stones or wall thickening.  No pericholecystic fluid or sonographic Murphy sign.  Common bile duct: Diameter: Normal caliber 2 mm diameter  Liver: Normal appearance  IVC: Normal appearance  Pancreas: Normal appearance  Spleen: Normal appearance, 4.9 cm length  Right Kidney: Length: 9.0 cm length. Cortical thinning. Increased cortical echogenicity. No mass or hydronephrosis. No shadowing calcification.  Left Kidney: Length: 10.7 cm. Normal cortical thickness. Increased cortical echogenicity. No mass, hydronephrosis or shadowing calcification.  Abdominal aorta: Normal caliber  Other findings: No free-fluid  IMPRESSION: Medical renal disease changes of both kidneys.  Otherwise negative exam.   Electronically Signed   By: Ulyses Southward M.D.   On: 08/14/2014 17:16   Mr Foot Left Wo Contrast  08/15/2014   CLINICAL DATA:  Great toe pain with ulceration along the nail bed. Evaluate for infection. Renal insufficiency. Initial encounter.  EXAM: MRI OF THE LEFT FOREFOOT WITHOUT CONTRAST  TECHNIQUE: Multiplanar, multisequence MR imaging was performed. No intravenous contrast was administered.  COMPARISON:  Radiographs  08/14/2014.  FINDINGS: There is soft tissue ulceration at the great toe nail bed as demonstrated on radiographs. No underlying soft tissue fluid collection identified. The fat saturation is suboptimal on the T2 weighted images. No T1 signal abnormality identified within the great toe. Mild T2 hyperintensity within the distal phalanx is difficult to exclude. There is no bone destruction or interphalangeal joint effusion. Mild degenerative changes are present at the first metatarsal phalangeal joint with subchondral cyst formation in the plantar aspect of the first metatarsal head.  The additional toes appear normal. There is ill-defined T2 hyperintensity within the proximal diaphysis of the second metatarsal. This could be stress mediated or reflect a small insufficiency fracture. The additional metatarsals appear normal. The alignment is normal at the Lisfranc joint. The visualized midfoot demonstrates no significant findings  There is mild T2 hyperintensity throughout the forefoot  muscles. No focal fluid collections identified. The flexor and extensor tendons appear normal.  IMPRESSION: 1. Soft tissue ulceration along the nail bed of the great toe without underlying focal fluid collection. 2. Evaluation of the distal phalanx of the great toe is limited by incomplete fat saturation on the T2 weighted images. Mild marrow hyperemia cannot be excluded. There is no cortical destruction or other T1 signal abnormality. 3. Ill-defined T2 hyperintensity within the proximal diaphysis of the second metatarsal may be incidental, stress mediated or a small insufficiency fracture.   Electronically Signed   By: Carey BullocksWilliam  Veazey M.D.   On: 08/15/2014 07:49   Dg Abd 2 Views  08/14/2014   CLINICAL DATA:  Progressive weakness. Postprandial nausea and vomiting.  EXAM: ABDOMEN - 2 VIEW  COMPARISON:  Chest x-ray of the same day.  FINDINGS: Heart is enlarged. Lung bases are clear. The bowel gas pattern is normal. There is no obstruction  or free air. Soft tissue granulomas are noted over the abdomen. No fluid levels are present. Degenerative changes are present in the lower lumbar spine, worse on the right. Vascular calcifications are noted.  IMPRESSION: 1. Normal bowel gas pattern. 2. Atherosclerosis. 3. Cardiomegaly without failure.   Electronically Signed   By: Marin Robertshristopher  Mattern M.D.   On: 08/14/2014 14:11   Dg Foot Complete Left  08/14/2014   CLINICAL DATA:  Trauma to great toe 1 week ago with pain and older.  EXAM: Three views of the left foot.  COMPARISON:  None.  FINDINGS: Marked soft tissue swelling is present in the great toe. There is gas within the soft tissues and ulceration about the nail bed. The distal phalanx is intact. No osseous erosion is present. Moderate osteopenia is noted. Calcifications are noted along the Achilles tendon.  IMPRESSION: 1. Soft tissue ulceration at the level of the nail bed with extensive soft tissue swelling in the great toe. 2. No acute or focal associated osseous abnormality. 3. Moderate osteopenia   Electronically Signed   By: Marin Robertshristopher  Mattern M.D.   On: 08/14/2014 14:09   Medications: I have reviewed the patient's current medications. Scheduled Meds: . aspirin  81 mg Oral Daily  .  ceFAZolin (ANCEF) IV  1 g Intravenous 3 times per day  . feeding supplement (ENSURE ENLIVE)  237 mL Oral BID BM  . folic acid  1 mg Oral Daily  . heparin  5,000 Units Subcutaneous 3 times per day  . simvastatin  20 mg Oral q1800  . sodium chloride  3 mL Intravenous Q12H  . vitamin B-12  1,000 mcg Oral Daily   Continuous Infusions:  PRN Meds:.acetaminophen, ondansetron (ZOFRAN) IV, oxyCODONE-acetaminophen Assessment/Plan: Active Problems:   AKI (acute kidney injury)   Hypovolemia  81 yo female with systolic CHF, CAD, hx of HTN, here with Left great toe cellulitis and N/V.  Left great toe moderate cellulitis- Afebrile though with persistent pain.    - no fever or white count. Had hypotension  initially which improved with 2L fluid boluses. Does have foul smell and fluctuance on exam Xray shows only soft tissue. MRI neg for abscess or osteo. - f/up bcx - cont ancef today. May transition to keflex tomorrow. - wound care consult - pain control with percocet q4hr prn.  N/V postprandial- likely 2/2 to infection- Improved with nausea or abdominal pain overnight.  Without diarrhea or fever. No ab pain on exam. Also could have duodenal ulcer (pain better with eating but then vomits) Xray abdomen normal. Ab u/s  neg other than medical renal disease. H. Pylori pending. -zofran for nausea - advance diet as tolerated.  AKI - b/l 0.9, 1.9 on admission, imrpoved to 1.41 this am with fluids. likely 2/2 to dehydration from poor PO intake due to N/V while still taking diuretics - s/p 2L fluid in ED and 75cc/hr for 10 hours. Now encouraed PO intake. Hold diuretics and AceI. Be careful with fluids given CHF - monitor BMET. -feurea shows prerenal etiology.   Systolic CHF with severe MR. (ischemic cardiomyopathy) - reported EF 20-35%, on many medications. Volume down, BNP 100.  - no ECHO in chart, will obtain one. Follows with Dr. [place] outpatient.  - home meds include norvasc 10mg  daily, lasix 40mg  daily, metoprolol 50mg  only once day, ramipril 10mg  daily, and spironolactone 25mg  daily. Also on asa 81mg  daily. - cont asa 81mg  daily for now. Hold norvasc, lasix, metprolol, spironolactone, and rampiril with AKI and volume depletion  CAD - stable. No anginal symptoms EKG unchanged, trops neg. LHC 05/1999 showed mid LAD 50%, ostial ccx 80%, 99% prior to OM1, then cocluded, proximal RCA occlusion, EF 35-40%. EF then has been 25-30%. not candidate for revasc per Dr. Elvis Coil note 2012. Cont medical therapy - cont asa and lipitor. Hold rest for now.  Chronic macrocytic anemia -b/l hgb 10-11. Now at baseline. 10.7. folate was intermediate and B12 was low normal in the past, iron and ferritin also  normal.  - checked B12 and is low - give folic acid and B12 PO.  Hx of HTN -now hypotensive, hold hypertension meds.  Severe b/l onychomycosis of all toes - outpatient referral to podiatry needed.  HLD - cont zocor  Diet: Advance to heart healthy.   Dispo: Disposition is deferred at this time, awaiting improvement of current medical problems. Anticipated discharge in approximately 1-2 day(s).   The patient does have a current PCP (Peter M [place], MD) and does need an [facility] hospital follow-up appointment after discharge.  The patient does have transportation limitations that hinder transportation to clinic appointments. .Services Needed at time of discharge: Y = Yes, Blank = No PT:   OT:   RN:   Equipment:   Other:     LOS: 1 day   Hyacinth Meeker, MD 08/15/2014, 11:43 AM

## 2014-08-15 NOTE — Evaluation (Signed)
Physical Therapy Evaluation Patient Details Name: Karina Willis MRN: [id number] DOB: 05/27/1932 Today's Date: 08/15/2014   History of Present Illness  81 yo female with CAD, systolic CHF, severe MR, HTN, HLD here with n/v for 1 week. Shortly after she eats something, she vomits the foot content. No pain associated with the emesis. She can tolerate liquids including diet soda and coffee but no solid food or even soups. Last tried eating yesterday and vomitted. Did not eat anything today other than having her coffee. Intake has been low for 1 week. Has mild generalized abdominal pain when hungry which is relieved after she eats but then vomits. No blood in the vomit. No dysuria, diarrhea, fever, chills. No sick contacts or others with similar symptoms.   Clinical Impression  Pt admitted with above diagnosis. Pt currently with functional limitations due to the deficits listed below (see PT Problem List). Pt able to take a few pivotal steps to recliner from bed.  Needs continued PT to gain strength and endurance. Will follow acutely.  Pt will benefit from skilled PT to increase their independence and safety with mobility to allow discharge to the venue listed below.      Follow Up Recommendations SNF;Supervision/Assistance - 24 hour    Equipment Recommendations  None recommended by PT    Recommendations for Other Services       Precautions / Restrictions Precautions Precautions: Fall Restrictions Weight Bearing Restrictions: No      Mobility  Bed Mobility Overal bed mobility: Independent                Transfers Overall transfer level: Needs assistance Equipment used: 2 person hand held assist Transfers: Sit to/from Stand;Stand Pivot Transfers Sit to Stand: Min assist;+2 physical assistance Stand pivot transfers: Min assist;+2 physical assistance       General transfer comment: Needed steadying assist once up on feet.  Bil UE support needed.  Left toe pain with weigght  bearing.    Ambulation/Gait                Stairs            Wheelchair Mobility    Modified Rankin (Stroke Patients Only)       Balance Overall balance assessment: Needs assistance Sitting-balance support: No upper extremity supported;Feet supported Sitting balance-Leahy Scale: Fair     Standing balance support: Bilateral upper extremity supported;During functional activity Standing balance-Leahy Scale: Poor Standing balance comment: Needs UE support bil for stability.                              Pertinent Vitals/Pain Pain Assessment: Faces Faces Pain Scale: Hurts even more Pain Location: left great toe Pain Descriptors / Indicators: Grimacing;Guarding;Sore Pain Intervention(s): Limited activity within patient's tolerance;Premedicated before session;Monitored during session;Repositioned  VSS    Home Living Family/patient expects to be discharged to:: Skilled nursing facility Living Arrangements: Other relatives Available Help at Discharge: Family;Available PRN/intermittently (nephew lives with her but works.) Type of Home: Apartment Home Access: Level entry     Home Layout: One level Home Equipment: Environmental consultantWalker - 2 wheels;Cane - single point;Bedside commode      Prior Function Level of Independence: Independent with assistive device(s)         Comments: used RW.  Had become more difficult to get around per pt.      Hand Dominance   Dominant Hand: Right    Extremity/Trunk Assessment  Upper Extremity Assessment: Defer to OT evaluation           Lower Extremity Assessment: Generalized weakness      Cervical / Trunk Assessment: Kyphotic  Communication   Communication: No difficulties  Cognition Arousal/Alertness: Awake/alert Behavior During Therapy: WFL for tasks assessed/performed Overall Cognitive Status: Within Functional Limits for tasks assessed                      General Comments      Exercises         Assessment/Plan    PT Assessment Patient needs continued PT services  PT Diagnosis Generalized weakness;Acute pain   PT Problem List Decreased activity tolerance;Decreased balance;Decreased mobility;Decreased knowledge of use of DME;Decreased safety awareness;Decreased knowledge of precautions;Pain  PT Treatment Interventions DME instruction;Gait training;Functional mobility training;Therapeutic activities;Therapeutic exercise;Balance training;Patient/family education   PT Goals (Current goals can be found in the Care Plan section) Acute Rehab PT Goals Patient Stated Goal: to get better PT Goal Formulation: With patient Time For Goal Achievement: 08/29/14 Potential to Achieve Goals: Good    Frequency Min 2X/week   Barriers to discharge Decreased caregiver support      Co-evaluation               End of Session Equipment Utilized During Treatment: Gait belt Activity Tolerance: Patient limited by pain;Patient limited by fatigue Patient left: in chair;with call bell/phone within reach Nurse Communication: Mobility status;Patient requests pain meds (not time for pain meds per nurse)         Time: 4540-9811 PT Time Calculation (min) (ACUTE ONLY): 14 min   Charges:   PT Evaluation $Initial PT Evaluation Tier I: 1 Procedure     PT G CodesBerline Lopes 08-23-2014, 2:22 PM Angello Chien [facility] Acute Rehabilitation [phone] (pager)

## 2014-08-15 NOTE — Evaluation (Signed)
Occupational Therapy Evaluation Patient Details Name: Karina Willis MRN: [id number] DOB: 11/18/1932 Today's Date: 08/15/2014    History of Present Illness 81 y.o. female with CAD, systolic CHF, severe MR, HTN, HLD here with n/v for 1 week. Shortly after she eats something, she vomits the foot content. No pain associated with the emesis. She can tolerate liquids including diet soda and coffee but no solid food or even soups. Last tried eating yesterday and vomitted. Did not eat anything today other than having her coffee. Intake has been low for 1 week. Has mild generalized abdominal pain when hungry which is relieved after she eats but then vomits. No blood in the vomit. No dysuria, diarrhea, fever, chills. No sick contacts or others with similar symptoms.    Clinical Impression   Pt admitted with above. Pt reports she received assist with ADLs, PTA. Feel pt will benefit from acute OT to increase independence prior to d/c. Recommending SNF.     Follow Up Recommendations  SNF;Supervision/Assistance - 24 hour    Equipment Recommendations  Other (comment) (defer to next venue)    Recommendations for Other Services       Precautions / Restrictions Precautions Precautions: Fall Restrictions Weight Bearing Restrictions: No      Mobility Bed Mobility Overal bed mobility: Needs Assistance Bed Mobility: Sit to Supine       Sit to supine: Modified independent (Device/Increase time) (with exception to scoot HOB)   General bed mobility comments: cues and assist to scoot HOB  Transfers Overall transfer level: Needs assistance Transfers: Sit to/from Stand;Stand Pivot Transfers Sit to Stand: Min guard Stand pivot transfers: Min guard           Balance Min guard for sit to stand and stand pivot transfer-used RW.                         ADL Overall ADL's : Needs assistance/impaired     Grooming: Set up;Sitting       Lower Body Bathing: Minimal assistance;Sit  to/from stand   Upper Body Dressing : Set up;Sitting   Lower Body Dressing: Moderate assistance;Sit to/from stand;Minimal assistance Lower Body Dressing Details (indicate cue type and reason): Min-Mod assist due to sore left toe Toilet Transfer: Min guard;Stand-pivot;RW (from chair to bed)   Toileting- Clothing Manipulation and Hygiene: Minimal assistance;Sit to/from stand       Functional mobility during ADLs: Min guard;Rolling walker (stand pivot) General ADL Comments: Pt able to don/doff right sock sitting in chair. Pt transferred from chair to bed.      Vision     Perception     Praxis      Pertinent Vitals/Pain Pain Assessment: Faces Faces Pain Scale: Hurts little more Pain Location: left shoulder with increased shoulder flexion Pain Descriptors / Indicators: indicated it hurt Pain Intervention(s): Monitored during session;Limited activity within patient's tolerance     Hand Dominance Right   Extremity/Trunk Assessment Upper Extremity Assessment Upper Extremity Assessment:  (limited AROM bilateral shoulder flexion)   Lower Extremity Assessment Lower Extremity Assessment: Defer to PT evaluation     Communication Communication Communication: No difficulties   Cognition Arousal/Alertness: Awake/alert Behavior During Therapy: WFL for tasks assessed/performed Overall Cognitive Status: Within Functional Limits for tasks assessed                     General Comments       Exercises       Shoulder Instructions  Home Living Family/patient expects to be discharged to:: Skilled nursing facility Living Arrangements: Other relatives Available Help at Discharge: Family;Available PRN/intermittently Type of Home: Apartment Home Access: Level entry     Home Layout: One level               Home Equipment: Walker - 2 wheels;Cane - single point;Bedside commode          Prior Functioning/Environment Level of Independence: Needs assistance     ADL's / Homemaking Assistance Needed: assist with bathing and also with dressing at times   Comments: used RW.  Had become more difficult to get around per pt.     OT Diagnosis: Generalized weakness;Acute pain   OT Problem List: Decreased knowledge of precautions;Decreased knowledge of use of DME or AE;Decreased activity tolerance;Decreased range of motion;Decreased strength   OT Treatment/Interventions: Self-care/ADL training;DME and/or AE instruction;Therapeutic activities;Patient/family education;Balance training;Therapeutic exercise    OT Goals(Current goals can be found in the care plan section) Acute Rehab OT Goals Patient Stated Goal: not stated OT Goal Formulation: With patient Time For Goal Achievement: 08/22/14 Potential to Achieve Goals: Good ADL Goals Pt Will Perform Lower Body Bathing: sit to/from stand;with set-up Pt Will Perform Lower Body Dressing: with set-up;sit to/from stand Pt Will Transfer to Toilet: with supervision;ambulating;bedside commode  OT Frequency: Min 2X/week   Barriers to D/C:            Co-evaluation              End of Session Equipment Utilized During Treatment: Gait belt;Rolling walker  Activity Tolerance: Patient tolerated treatment well Patient left: in bed;with call bell/phone within reach;with bed alarm set   Time: 1610-9604 OT Time Calculation (min): 12 min Charges:  OT General Charges $OT Visit: 1 Procedure OT Evaluation $Initial OT Evaluation Tier I: 1 Procedure G-CodesEarlie Raveling OTR/L [id number] 08/15/2014, 3:52 PM

## 2014-08-15 NOTE — Progress Notes (Signed)
UR COMPLETED  

## 2014-08-15 NOTE — Progress Notes (Signed)
Initial Nutrition Assessment  DOCUMENTATION CODES:  Non-severe (moderate) malnutrition in context of acute illness/injury  INTERVENTION:  Ensure Enlive (each supplement provides 350kcal and 20 grams of protein)  NUTRITION DIAGNOSIS:  Inadequate oral intake related to nausea, vomiting as evidenced by percent weight loss.   GOAL:  Patient will meet greater than or equal to 90% of their needs   MONITOR:  PO intake, Supplement acceptance, Labs, Skin, I & O's  REASON FOR ASSESSMENT:  Malnutrition Screening Tool    ASSESSMENT: 81 yo female with CAD, systolic CHF, severe MR, HTN, HLD here with n/v for 1 week.  Pt states that for the past week she has been unable to keep any food down. She reports losing from 140 lbs to 122 lbs during this time. She is feeling better today and was able to eat some jello and broth for breakfast without any emesis. Weight history shows 23% wt loss in the past year; per pt report she has lost 13% of her body weight in the past week. Patient tried Ensure Enlive at time of visit and is agreeable to drinking it BID until PO intake/tolerance improves.   Labs: low sodium, low vitamin B-12, elevated lipase  Height:  Ht Readings from Last 1 Encounters:  08/14/14 4\' 11"  (1.499 m)    Weight:  Wt Readings from Last 1 Encounters:  08/15/14 122 lb 2.2 oz (55.4 kg)    Ideal Body Weight:  44.7 kg  Wt Readings from Last 10 Encounters:  08/15/14 122 lb 2.2 oz (55.4 kg)  08/02/13 158 lb (71.668 kg)  11/18/12 158 lb (71.668 kg)  05/07/11 162 lb 9.6 oz (73.755 kg)  02/04/11 171 lb 6.4 oz (77.747 kg)  10/02/10 168 lb 3.2 oz (76.295 kg)  08/21/10 172 lb 2 oz (78.075 kg)  08/01/10 184 lb (83.462 kg)  10/17/09 167 lb (75.751 kg)  04/04/09 175 lb 1.6 oz (79.425 kg)    BMI:  Body mass index is 24.66 kg/(m^2).  Estimated Nutritional Needs:  Kcal:  1350-1550  Protein:  65-75 grams  Fluid:  1.5 L/day  Skin:  Wound (see comment) (cellulitis/purulant  drainage of left toe)  Diet Order:  Diet Heart Room service appropriate?: Yes; Fluid consistency:: Thin  EDUCATION NEEDS:  No education needs identified at this time   Intake/Output Summary (Last 24 hours) at 08/15/14 1055 Last data filed at 08/15/14 0949  Gross per 24 hour  Intake   2645 ml  Output   1250 ml  Net   1395 ml    Last BM:  6/5  Ian Malkineanne Barnett RD, LDN Inpatient Clinical Dietitian Pager: [phone] After Hours Pager: [phone]

## 2014-08-15 NOTE — Consult Note (Signed)
WOC wound consult note Reason for Consult: left great toe wound.  Today I see only a fissured area along the lateral toe. I did not appreciate any drainage today, continues to be tender to palpation and tender with ROM Wound type: trauma, hit over door  Pressure Ulcer POA: No Measurement: liner area aprox 1.0cm x 0.1cm x 0.1cm Wound ZOX:WRUEbed:pink  Drainage (amount, consistency, odor) none at the time of my assessment Periwound:intact, but tender Dressing procedure/placement/frequency: Emollient for fissure, no other topical care at this time.  Discussed POC with patient and bedside nurse.  Re consult if needed, will not follow at this time. Thanks  Keyden Pavlov [employer] RN, CWOCN [phone])

## 2014-08-15 NOTE — Progress Notes (Signed)
  Echocardiogram 2D Echocardiogram has been performed.  Karina SavoyCasey N Ignazio Kincaid 08/15/2014, 8:56 AM

## 2014-08-16 DIAGNOSIS — N179 Acute kidney failure, unspecified: Secondary | ICD-10-CM | POA: Diagnosis not present

## 2014-08-16 DIAGNOSIS — B9689 Other specified bacterial agents as the cause of diseases classified elsewhere: Secondary | ICD-10-CM | POA: Diagnosis not present

## 2014-08-16 DIAGNOSIS — I509 Heart failure, unspecified: Secondary | ICD-10-CM | POA: Diagnosis not present

## 2014-08-16 DIAGNOSIS — D519 Vitamin B12 deficiency anemia, unspecified: Secondary | ICD-10-CM | POA: Diagnosis not present

## 2014-08-16 DIAGNOSIS — I739 Peripheral vascular disease, unspecified: Secondary | ICD-10-CM | POA: Diagnosis not present

## 2014-08-16 DIAGNOSIS — R5381 Other malaise: Secondary | ICD-10-CM | POA: Diagnosis not present

## 2014-08-16 DIAGNOSIS — I2510 Atherosclerotic heart disease of native coronary artery without angina pectoris: Secondary | ICD-10-CM | POA: Diagnosis not present

## 2014-08-16 DIAGNOSIS — N289 Disorder of kidney and ureter, unspecified: Secondary | ICD-10-CM | POA: Diagnosis not present

## 2014-08-16 DIAGNOSIS — I5022 Chronic systolic (congestive) heart failure: Secondary | ICD-10-CM | POA: Diagnosis not present

## 2014-08-16 DIAGNOSIS — E780 Pure hypercholesterolemia: Secondary | ICD-10-CM | POA: Diagnosis not present

## 2014-08-16 DIAGNOSIS — M869 Osteomyelitis, unspecified: Secondary | ICD-10-CM | POA: Diagnosis not present

## 2014-08-16 DIAGNOSIS — M150 Primary generalized (osteo)arthritis: Secondary | ICD-10-CM | POA: Diagnosis not present

## 2014-08-16 DIAGNOSIS — L03119 Cellulitis of unspecified part of limb: Secondary | ICD-10-CM | POA: Diagnosis not present

## 2014-08-16 DIAGNOSIS — L03032 Cellulitis of left toe: Secondary | ICD-10-CM | POA: Diagnosis not present

## 2014-08-16 DIAGNOSIS — I96 Gangrene, not elsewhere classified: Secondary | ICD-10-CM | POA: Diagnosis not present

## 2014-08-16 DIAGNOSIS — I10 Essential (primary) hypertension: Secondary | ICD-10-CM | POA: Diagnosis not present

## 2014-08-16 DIAGNOSIS — D539 Nutritional anemia, unspecified: Secondary | ICD-10-CM

## 2014-08-16 DIAGNOSIS — R2689 Other abnormalities of gait and mobility: Secondary | ICD-10-CM | POA: Diagnosis not present

## 2014-08-16 DIAGNOSIS — M6281 Muscle weakness (generalized): Secondary | ICD-10-CM | POA: Diagnosis not present

## 2014-08-16 DIAGNOSIS — E440 Moderate protein-calorie malnutrition: Secondary | ICD-10-CM | POA: Diagnosis not present

## 2014-08-16 DIAGNOSIS — M810 Age-related osteoporosis without current pathological fracture: Secondary | ICD-10-CM | POA: Diagnosis not present

## 2014-08-16 LAB — BASIC METABOLIC PANEL
Anion gap: 11 (ref 5–15)
BUN: 29 mg/dL — ABNORMAL HIGH (ref 6–20)
CO2: 22 mmol/L (ref 22–32)
Calcium: 8.7 mg/dL — ABNORMAL LOW (ref 8.9–10.3)
Chloride: 102 mmol/L (ref 101–111)
Creatinine, Ser: 1.2 mg/dL — ABNORMAL HIGH (ref 0.44–1.00)
GFR calc Af Amer: 48 mL/min — ABNORMAL LOW (ref >60)
GFR calc non Af Amer: 42 mL/min — ABNORMAL LOW (ref >60)
Glucose, Bld: 74 mg/dL (ref 65–99)
POTASSIUM: 4.2 mmol/L (ref 3.5–5.1)
SODIUM: 135 mmol/L (ref 135–145)

## 2014-08-16 LAB — LIPASE, BLOOD: Lipase: 39 U/L (ref 22–51)

## 2014-08-16 LAB — FOLATE: FOLATE: 7.7 ng/mL (ref >5.9)

## 2014-08-16 MED ORDER — CYANOCOBALAMIN 1000 MCG/ML IJ SOLN
1000.0000 ug | Freq: Once | INTRAMUSCULAR | Status: AC
  Administered 2014-08-16: 1000 ug via INTRAMUSCULAR
  Filled 2014-08-16: qty 1

## 2014-08-16 MED ORDER — OXYCODONE HCL 5 MG PO TABS: 5.0000 mg | ORAL_TABLET | ORAL | Status: DC | PRN

## 2014-08-16 MED ORDER — ENSURE ENLIVE PO LIQD: 237.0000 mL | Freq: Two times a day (BID) | ORAL | Status: DC

## 2014-08-16 MED ORDER — OXYCODONE HCL 5 MG PO TABS
5.0000 mg | ORAL_TABLET | ORAL | Status: DC | PRN
  Administered 2014-08-16: 10 mg via ORAL
  Filled 2014-08-16: qty 2

## 2014-08-16 MED ORDER — CEFAZOLIN SODIUM 1-5 GM-% IV SOLN: 1.0000 g | Freq: Two times a day (BID) | INTRAVENOUS | Status: DC

## 2014-08-16 MED ORDER — HYDROCERIN EX CREA: 1.0000 "application " | TOPICAL_CREAM | Freq: Two times a day (BID) | CUTANEOUS | Status: AC

## 2014-08-16 MED ORDER — CEPHALEXIN 500 MG PO CAPS: 500.0000 mg | ORAL_CAPSULE | Freq: Four times a day (QID) | ORAL | Status: DC

## 2014-08-16 MED ORDER — CYANOCOBALAMIN 1000 MCG PO TABS: 1000.0000 ug | ORAL_TABLET | Freq: Every day | ORAL | Status: AC

## 2014-08-16 NOTE — Progress Notes (Signed)
Subjective:  Hypotensive overnight in 80-90's. Required bolus yesterday. Afebrile. BP improved to 120's. Remains in pain on the left great toe. Has been able to keep down food.  Objective: Vital signs in last 24 hours: Filed Vitals:   08/16/14 0211 08/16/14 0512 08/16/14 0722 08/16/14 0957  BP: 96/52 90/52 89/46  124/64  Pulse: 72 87 85 93  Temp: 98.2 F (36.8 C) 98 F (36.7 C) 98.1 F (36.7 C) 98.4 F (36.9 C)  TempSrc: Oral Oral Oral Oral  Resp: 18 18 18 20   Height:      Weight:  121 lb 3.7 oz (54.991 kg)    SpO2: 100% 100% 100% 98%   Weight change: -3 lb 15.8 oz (-1.809 kg)  Intake/Output Summary (Last 24 hours) at 08/16/14 1142 Last data filed at 08/16/14 0852  Gross per 24 hour  Intake    600 ml  Output    700 ml  Net   -100 ml   Physical Exam   Vitals reviewed. General: resting in bed,in some distress due to pain. HEENT: PERRL, EOMI, no scleral icterus Cardiac: RRR, no rubs, murmurs or gallops Pulm: clear to auscultation bilaterally, no wheezes, rales, or rhonchi Abd: soft, nontender, nondistended, BS present Ext: Pulses intact b/l lower extremities. Left great toe has superficial skin laceration around the nail base with some fluctuance on the plantar surface. Has significant tenderness all around her left great toe, mostly on the dorsum. No drainage noted. Some foul smell noted. No pedal edema. Has severe onychomycosis on all toes bilaterally.  Neuro: alert and oriented X3, cranial nerves II-XII grossly intact, strength and sensation to light touch equal in bilateral upper and lower extremities  Lab Results: Basic Metabolic Panel:  Recent Labs Lab 08/14/14 1815 08/15/14 0332 08/16/14 0400  NA  --  134* 135  K  --  4.7 4.2  CL  --  102 102  CO2  --  19* 22  GLUCOSE  --  75 74  BUN  --  40* 29*  CREATININE  --  1.41* 1.20*  CALCIUM  --  8.8* 8.7*  MG 2.2  --   --   PHOS 3.4  --   --    Liver Function Tests:  Recent Labs Lab 08/14/14 1253  AST  10*  ALT 7*  ALKPHOS 54  BILITOT 0.5  PROT 7.1  ALBUMIN 3.0*    Recent Labs Lab 08/14/14 1815 08/16/14 0400  LIPASE 55* 39   No results for input(s): AMMONIA in the last 168 hours. CBC:  Recent Labs Lab 08/14/14 1253  WBC 10.3  NEUTROABS 8.5*  HGB 10.7*  HCT 32.6*  MCV 101.9*  PLT 162   Cardiac Enzymes:  Recent Labs Lab 08/14/14 1253  TROPONINI 0.03   BNP: No results for input(s): PROBNP in the last 168 hours. D-Dimer: No results for input(s): DDIMER in the last 168 hours. CBG:  Recent Labs Lab 08/14/14 1309  GLUCAP 74   Hemoglobin A1C:  Recent Labs Lab 08/14/14 1815  HGBA1C 5.9*  Anemia Panel:  Recent Labs Lab 08/14/14 1815 08/16/14 0400  VITAMINB12 161*  --   FOLATE  --  7.7   Urine Drug Screen: Drugs of Abuse     Component Value Date/Time   LABOPIA NONE DETECTED 08/02/2013 0926   COCAINSCRNUR NONE DETECTED 08/02/2013 0926   LABBENZ NONE DETECTED 08/02/2013 0926   AMPHETMU NONE DETECTED 08/02/2013 0926   THCU NONE DETECTED 08/02/2013 0926   LABBARB NONE DETECTED 08/02/2013 0926  Alcohol Level: No results for input(s): ETH in the last 168 hours. Urinalysis:  Recent Labs Lab 08/14/14 1505  COLORURINE YELLOW  LABSPEC 1.009  PHURINE 5.0  GLUCOSEU NEGATIVE  HGBUR MODERATE*  BILIRUBINUR NEGATIVE  KETONESUR NEGATIVE  PROTEINUR NEGATIVE  UROBILINOGEN 1.0  NITRITE NEGATIVE  LEUKOCYTESUR MODERATE*    Micro Results: Recent Results (from the past 240 hour(s))  Culture, blood (routine x 2)     Status: None (Preliminary result)   Collection Time: 08/14/14  6:15 PM  Result Value Ref Range Status   Specimen Description BLOOD RIGHT ANTECUBITAL  Final   Special Requests BOTTLES DRAWN AEROBIC AND ANAEROBIC 5CC  Final   Culture   Final           BLOOD CULTURE RECEIVED NO GROWTH TO DATE CULTURE WILL BE HELD FOR 5 DAYS BEFORE ISSUING A FINAL NEGATIVE REPORT Performed at [facility]    Report Status PENDING  Incomplete    Culture, blood (routine x 2)     Status: None (Preliminary result)   Collection Time: 08/14/14  6:30 PM  Result Value Ref Range Status   Specimen Description BLOOD LEFT ANTECUBITAL  Final   Special Requests BOTTLES DRAWN AEROBIC AND ANAEROBIC 5CC  Final   Culture   Final           BLOOD CULTURE RECEIVED NO GROWTH TO DATE CULTURE WILL BE HELD FOR 5 DAYS BEFORE ISSUING A FINAL NEGATIVE REPORT Performed at [facility]    Report Status PENDING  Incomplete  MRSA PCR Screening     Status: None   Collection Time: 08/14/14  6:32 PM  Result Value Ref Range Status   MRSA by PCR NEGATIVE NEGATIVE Final    Comment:        The GeneXpert MRSA Assay (FDA approved for NASAL specimens only), is one component of a comprehensive MRSA colonization surveillance program. It is not intended to diagnose MRSA infection nor to guide or monitor treatment for MRSA infections.    Studies/Results: Dg Chest 2 View  08/14/2014   CLINICAL DATA:  Lethargy, weakness, generalized chest pain  EXAM: CHEST  2 VIEW  COMPARISON:  None available  FINDINGS: Several skin folds overlie the chest bilaterally. Marked cardiomegaly without edema or CHF. No significant effusion or pneumothorax. Lungs remain clear. Mild hyperinflation evident. No focal pneumonia, collapse or consolidation. Trachea is midline. Minor degenerative changes and scoliosis of the spine. Aortic and coronary atherosclerosis evident.  IMPRESSION: Cardiomegaly without CHF or pneumonia  Mild hyperinflation   Electronically Signed   By: Judie Petit.  Shick M.D.   On: 08/14/2014 14:12   US Abdomen Complete  08/14/2014   CLINICAL DATA:  Nausea and vomiting for 2 days, history CHF, coronary artery disease, hypertension  EXAM: ULTRASOUND ABDOMEN COMPLETE  COMPARISON:  None  FINDINGS: Gallbladder: Normally distended without stones or wall thickening.  No pericholecystic fluid or sonographic Murphy sign.  Common bile duct: Diameter: Normal caliber 2 mm diameter  Liver:  Normal appearance  IVC: Normal appearance  Pancreas: Normal appearance  Spleen: Normal appearance, 4.9 cm length  Right Kidney: Length: 9.0 cm length. Cortical thinning. Increased cortical echogenicity. No mass or hydronephrosis. No shadowing calcification.  Left Kidney: Length: 10.7 cm. Normal cortical thickness. Increased cortical echogenicity. No mass, hydronephrosis or shadowing calcification.  Abdominal aorta: Normal caliber  Other findings: No free-fluid  IMPRESSION: Medical renal disease changes of both kidneys.  Otherwise negative exam.   Electronically Signed   By: Angelyn Punt.D.  On: 08/14/2014 17:16   Mr Foot Left Wo Contrast  08/15/2014   CLINICAL DATA:  Great toe pain with ulceration along the nail bed. Evaluate for infection. Renal insufficiency. Initial encounter.  EXAM: MRI OF THE LEFT FOREFOOT WITHOUT CONTRAST  TECHNIQUE: Multiplanar, multisequence MR imaging was performed. No intravenous contrast was administered.  COMPARISON:  Radiographs 08/14/2014.  FINDINGS: There is soft tissue ulceration at the great toe nail bed as demonstrated on radiographs. No underlying soft tissue fluid collection identified. The fat saturation is suboptimal on the T2 weighted images. No T1 signal abnormality identified within the great toe. Mild T2 hyperintensity within the distal phalanx is difficult to exclude. There is no bone destruction or interphalangeal joint effusion. Mild degenerative changes are present at the first metatarsal phalangeal joint with subchondral cyst formation in the plantar aspect of the first metatarsal head.  The additional toes appear normal. There is ill-defined T2 hyperintensity within the proximal diaphysis of the second metatarsal. This could be stress mediated or reflect a small insufficiency fracture. The additional metatarsals appear normal. The alignment is normal at the Lisfranc joint. The visualized midfoot demonstrates no significant findings  There is mild T2 hyperintensity  throughout the forefoot muscles. No focal fluid collections identified. The flexor and extensor tendons appear normal.  IMPRESSION: 1. Soft tissue ulceration along the nail bed of the great toe without underlying focal fluid collection. 2. Evaluation of the distal phalanx of the great toe is limited by incomplete fat saturation on the T2 weighted images. Mild marrow hyperemia cannot be excluded. There is no cortical destruction or other T1 signal abnormality. 3. Ill-defined T2 hyperintensity within the proximal diaphysis of the second metatarsal may be incidental, stress mediated or a small insufficiency fracture.   Electronically Signed   By: Carey Bullocks M.D.   On: 08/15/2014 07:49   Dg Abd 2 Views  08/14/2014   CLINICAL DATA:  Progressive weakness. Postprandial nausea and vomiting.  EXAM: ABDOMEN - 2 VIEW  COMPARISON:  Chest x-ray of the same day.  FINDINGS: Heart is enlarged. Lung bases are clear. The bowel gas pattern is normal. There is no obstruction or free air. Soft tissue granulomas are noted over the abdomen. No fluid levels are present. Degenerative changes are present in the lower lumbar spine, worse on the right. Vascular calcifications are noted.  IMPRESSION: 1. Normal bowel gas pattern. 2. Atherosclerosis. 3. Cardiomegaly without failure.   Electronically Signed   By: Marin Roberts M.D.   On: 08/14/2014 14:11   Dg Foot Complete Left  08/14/2014   CLINICAL DATA:  Trauma to great toe 1 week ago with pain and older.  EXAM: Three views of the left foot.  COMPARISON:  None.  FINDINGS: Marked soft tissue swelling is present in the great toe. There is gas within the soft tissues and ulceration about the nail bed. The distal phalanx is intact. No osseous erosion is present. Moderate osteopenia is noted. Calcifications are noted along the Achilles tendon.  IMPRESSION: 1. Soft tissue ulceration at the level of the nail bed with extensive soft tissue swelling in the great toe. 2. No acute or focal  associated osseous abnormality. 3. Moderate osteopenia   Electronically Signed   By: Marin Roberts M.D.   On: 08/14/2014 14:09   Medications: I have reviewed the patient's current medications. Scheduled Meds: . aspirin  81 mg Oral Daily  .  ceFAZolin (ANCEF) IV  1 g Intravenous 3 times per day  . cyanocobalamin  1,000 mcg  Intramuscular Once  . feeding supplement (ENSURE ENLIVE)  237 mL Oral BID BM  . heparin  5,000 Units Subcutaneous 3 times per day  . hydrocerin   Topical BID  . pantoprazole  40 mg Oral Daily  . simvastatin  20 mg Oral q1800  . sodium chloride  3 mL Intravenous Q12H  . vitamin B-12  1,000 mcg Oral Daily   Continuous Infusions:  PRN Meds:.acetaminophen, ondansetron (ZOFRAN) IV, oxyCODONE-acetaminophen Assessment/Plan: Active Problems:   AKI (acute kidney injury)   Hypovolemia   B12 deficiency   Moderate malnutrition   Prediabetes   Cellulitis of great toe of left foot   Hypotension  81 yo female with systolic CHF, CAD, hx of HTN, here with Left great toe cellulitis and N/V.  Left great toe moderate cellulitis- Afebrile though with persistent pain and intermittent hypotension.   - no fever or white count. Had hypotension initially which improved with 2L fluid boluses then still remains 80-90's. Does have foul smell and fluctuance on exam Xray shows only soft tissue. MRI neg for abscess or osteo. - BCX NGTD. Got ancef 2 days, will switch to keflex and treat for total 10 days.  - wound care consulted - no recs. Apply eucerin cream.  - pain control with percocet q4hr prn. Will send out with short supply.  - should follow up at the clinic for reassessment after completion of abx. Wants to go to IM clinic to establish primary care there.  N/V postprandial- likely 2/2 to infection-resolved. Able to tolerate diet. No diarrhea or fever. No ab pain on exam. Also could have duodenal ulcer (pain better with eating but then vomits) Xray abdomen normal. Ab u/s neg  other than medical renal disease. H. Pylori pending. -zofran for nausea PRN  AKI - b/l 0.9, 1.9 on admission, imrpoved to 1.2 now his am with fluids. likely 2/2 to dehydration from poor PO intake due to N/V while still taking diuretics - s/p 2L fluid in ED and 75cc/hr for 10 hours. Required some bolus yesterday. Encouraed PO intake. Hold diuretics and AceI. Be careful with fluids given CHF - monitor BMET outpatient at f/up.  Diastolic Dysfunction grade 1 - Hx of Systolic CHF with severe MR. (ischemic cardiomyopathy) - reported EF 20-35%, on many medications. Volume down, BNP 100.  Used to Follow with Dr. [place] outpatient. We repeated an ECHO and it shows that EF is actually 55-60%, and only Mild MR. Diastolic 1 dysfunction.  - home meds include norvasc  daily, lasix  daily, metoprolol  only once day, ramipril  daily, and spironolactone  daily. Also on asa  daily. - cont asa  daily for now. Hold norvasc, lasix, metprolol, spironolactone, and rampiril with AKI and also low BP. - may restart on clinic follow up one by one if BP tolerates.  CAD - stable. No anginal symptoms EKG unchanged, trops neg. LHC 05/1999 showed mid LAD 50%, ostial ccx 80%, 99% prior to OM1, then cocluded, proximal RCA occlusion, EF 35-40%. EF then has been 25-30%. not candidate for revasc per Dr. Elvis Coil note 2012. Cont medical therapy - cont asa and lipitor. Hold rest for now as above.  Chronic macrocytic anemia -b/l hgb 10-11. Now at baseline. 10.7. folate was intermediate and B12 was low normal in the past, iron and ferritin also normal.  - checked B12 and is low - give folic acid and B12 PO. Also gave B12 shot in the hospital - may consider monthly b12 shots but patient has hx of  missing appts so PO may be better.  Hx of HTN -now hypotensive, hold hypertension meds as above.  Severe b/l onychomycosis of all toes - outpatient referral to podiatry needed.  HLD - cont zocor  Diet:  Advance to heart healthy.   Dispo: Disposition is deferred at this time, awaiting improvement of current medical problems. Anticipated discharge in approximately 1-2 day(s).   The patient does have a current PCP (Peter M SwazilandJordan, MD) and does [facility] after discharge.  The patient does have transportation limitations that hinder transportation to clinic appointments. .Services Needed at time of discharge: Y = Yes, Blank = No PT:   OT:   RN:   Equipment:   Other:     LOS: 2 days   Karina Meekerasrif Arlone Lenhardt, MD 08/16/2014, 11:42 AM

## 2014-08-16 NOTE — Clinical Social Work Placement (Signed)
   CLINICAL SOCIAL WORK PLACEMENT  NOTE  Date:  08/16/2014  Patient Details  Name: Karina Willis MRN: [id number] Date of Birth: 06/24/1932  Clinical Social Work is seeking post-discharge placement for this patient at the Skilled  Nursing Facility level of care (*CSW will initial, date and re-position this form in  chart as items are completed):   (List not given to patient- requesting [facility] where she has been a resident earlier this year.)   Patient/family provided with [facility] Clinical Social Work Department's list of facilities offering this level of care within the geographic area requested by the patient (or if unable, by the patient's family).  No   Patient/family informed of their freedom to choose among providers that offer the needed level of care, that participate in Medicare, Medicaid or managed care program needed by the patient, have an available bed and are willing to accept the patient.  No   Patient/family informed of [facility] ownership interest in [facility] and [facility], as well as of the fact that they are under no obligation to receive care at these facilities.  PASRR submitted to EDS on       PASRR number received on       Existing PASRR number confirmed on 08/16/14     FL2 transmitted to all facilities in geographic area requested by pt/family on 08/16/14     FL2 transmitted to all facilities within larger geographic area on       Patient informed that his/her managed care company has contracts with or will negotiate with certain facilities, including the following:   [facility]([employer]- Medicare Complete)     Yes (Patient informed. Unable to reach neiceVena Austria- Willis. Vmail not in place)   Patient/family informed of bed offers received.  [facility]     Physician recommends and patient chooses bed at      [facility].  Patient to be transferred to  facility by [facility])     Patient family notified on 08/16/14 of transfer.  Name of family member notified:  Message left for nephew Karina StackDale Willis [phone] (number provided by patient. Unable to reach neice- Willis     PHYSICIAN Please prepare priority discharge summary, including medications, Please sign FL2, Please prepare prescriptions     Additional Comment:  Ok per MD for d/c to SNF today- 08/16/14. She is a former resident [facility]. Nursing will call report to the facility.  Message left for nephew re: d/c. Patient is pleased with d/c plan. She states her left great toe is causing her extensive pain. Discussed with her nurse and with Dr. Tasia Willis.  CSW signing off.  Karina Willis. Andria RheinCrowder, LCSW [phone]      _______________________________________________ Karina Willis, Karina Laver T, LCSW 08/16/2014, 12:09 PM

## 2014-08-16 NOTE — Discharge Summary (Signed)
Name: Karina Willis MRN: [id number] DOB: April 09, 1932 81 y.o. PCP: Karina M Swaziland, MD  Date of Admission: 08/14/2014 12:16 PM Date of Discharge: 08/16/2014 Attending Physician: Earl Lagos, MD  Discharge Diagnosis:  Active Problems:   AKI (acute kidney injury)   Hypovolemia   B12 deficiency   Moderate malnutrition   Prediabetes   Cellulitis of great toe of left foot   Hypotension  Discharge Medications:   Medication List    STOP taking these medications        amLODipine 10 MG tablet  Commonly known as:  NORVASC     furosemide 40 MG tablet  Commonly known as:  LASIX     metoprolol 50 MG tablet  Commonly known as:  LOPRESSOR     ramipril 10 MG capsule  Commonly known as:  ALTACE     spironolactone 25 MG tablet  Commonly known as:  ALDACTONE      TAKE these medications        acetaminophen 325 MG tablet  Commonly known as:  TYLENOL  Take 2 tablets (650 mg total) by mouth every 6 (six) hours as needed for moderate pain.     aspirin 81 MG chewable tablet  Chew 1 tablet (81 mg total) by mouth daily.     aspirin-acetaminophen-caffeine 250-250-65 MG per tablet  Commonly known as:  EXCEDRIN MIGRAINE  Take 1 tablet by mouth every 6 (six) hours as needed for headache.     cephALEXin 500 MG capsule  Commonly known as:  KEFLEX  Take 1 capsule (500 mg total) by mouth 4 (four) times daily.     cyanocobalamin 1000 MCG tablet  Take 1 tablet (1,000 mcg total) by mouth daily.     feeding supplement (ENSURE ENLIVE) Liqd  Take 237 mLs by mouth 2 (two) times daily between meals.     hydrocerin Crea  Apply 1 application topically 2 (two) times daily.     nitroGLYCERIN 0.4 MG SL tablet  Commonly known as:  NITROSTAT  Place 1 tablet (0.4 mg total) under the tongue every 5 (five) minutes as needed for chest pain. Must keep appointment 10/31/14 with Dr Willis     oxyCODONE 5 MG immediate release tablet  Commonly known as:  Oxy IR/ROXICODONE  Take 1-2 tablets (5-10 mg  total) by mouth every 4 (four) hours as needed for severe pain.     simvastatin 20 MG tablet  Commonly known as:  ZOCOR  Take 1 tablet (20 mg total) by mouth daily at 6 PM.        Disposition and follow-up:   Karina Willis was discharged from [facility] in Stable condition.  At the hospital follow up visit please address:  1.   Cont keflex  q6hr until 08/23/2014 for 10 day course.   Pain control: prescribed oxy IR 5-10mg  q4hr prn #30 tabs.   Stopped lasix, spironolactone, aceI and amlodipine as BP low and repeat ECHO shows good EF and grd 1 diastolic dysfunction only.   Needs to follow up with Dr. Swaziland (cards), also [facility] care as new patient.  Wound care: no recs from wound care team, apply Eucerin.  Consider B12 shot monthly. Continued B12 PO as well.   2.  Labs / imaging needed at time of follow-up: BMET  3.  Pending labs/ test needing follow-up:  H. Pylori, intrinsic factor.   Follow-up Appointments: Follow-up Information    Follow  up with Karina Swaziland, MD.   Specialty:  Cardiology   Contact information:   [address] [place] [address] [phone]       Schedule an appointment as soon as possible for a visit with [facility].   Contact information:   [address] [place] [address] [phone]      Discharge Instructions:   Consultations:    Procedures Performed:  Dg Chest 2 View  08/14/2014   CLINICAL DATA:  Lethargy, weakness, generalized chest pain  EXAM: CHEST  2 VIEW  COMPARISON:  None available  FINDINGS: Several skin folds overlie the chest bilaterally. Marked cardiomegaly without edema or CHF. No significant effusion or pneumothorax. Lungs remain clear. Mild hyperinflation evident. No focal pneumonia, collapse or consolidation. Trachea is midline. Minor degenerative changes and scoliosis of the  spine. Aortic and coronary atherosclerosis evident.  IMPRESSION: Cardiomegaly without CHF or pneumonia  Mild hyperinflation   Electronically Signed   By: Judie Petit.  Shick M.D.   On: 08/14/2014 14:12   US Abdomen Complete  08/14/2014   CLINICAL DATA:  Nausea and vomiting for 2 days, history CHF, coronary artery disease, hypertension  EXAM: ULTRASOUND ABDOMEN COMPLETE  COMPARISON:  None  FINDINGS: Gallbladder: Normally distended without stones or wall thickening.  No pericholecystic fluid or sonographic Murphy sign.  Common bile duct: Diameter: Normal caliber 2 mm diameter  Liver: Normal appearance  IVC: Normal appearance  Pancreas: Normal appearance  Spleen: Normal appearance, 4.9 cm length  Right Kidney: Length: 9.0 cm length. Cortical thinning. Increased cortical echogenicity. No mass or hydronephrosis. No shadowing calcification.  Left Kidney: Length: 10.7 cm. Normal cortical thickness. Increased cortical echogenicity. No mass, hydronephrosis or shadowing calcification.  Abdominal aorta: Normal caliber  Other findings: No free-fluid  IMPRESSION: Medical renal disease changes of both kidneys.  Otherwise negative exam.   Electronically Signed   By: Ulyses Southward M.D.   On: 08/14/2014 17:16   Mr Foot Left Wo Contrast  08/15/2014   CLINICAL DATA:  Great toe pain with ulceration along the nail bed. Evaluate for infection. Renal insufficiency. Initial encounter.  EXAM: MRI OF THE LEFT FOREFOOT WITHOUT CONTRAST  TECHNIQUE: Multiplanar, multisequence MR imaging was performed. No intravenous contrast was administered.  COMPARISON:  Radiographs 08/14/2014.  FINDINGS: There is soft tissue ulceration at the great toe nail bed as demonstrated on radiographs. No underlying soft tissue fluid collection identified. The fat saturation is suboptimal on the T2 weighted images. No T1 signal abnormality identified within the great toe. Mild T2 hyperintensity within the distal phalanx is difficult to exclude. There is no bone destruction or  interphalangeal joint effusion. Mild degenerative changes are present at the first metatarsal phalangeal joint with subchondral cyst formation in the plantar aspect of the first metatarsal head.  The additional toes appear normal. There is ill-defined T2 hyperintensity within the proximal diaphysis of the second metatarsal. This could be stress mediated or reflect a small insufficiency fracture. The additional metatarsals appear normal. The alignment is normal at the Lisfranc joint. The visualized midfoot demonstrates no significant findings  There is mild T2 hyperintensity throughout the forefoot muscles. No focal fluid collections identified. The flexor and extensor tendons appear normal.  IMPRESSION: 1. Soft tissue ulceration along the nail bed of the great toe without underlying focal fluid collection. 2. Evaluation of the distal phalanx of the great toe is limited by incomplete fat saturation on the T2 weighted images. Mild marrow hyperemia cannot be excluded. There  is no cortical destruction or other T1 signal abnormality. 3. Ill-defined T2 hyperintensity within the proximal diaphysis of the second metatarsal may be incidental, stress mediated or a small insufficiency fracture.   Electronically Signed   By: Carey BullocksWilliam  Veazey M.D.   On: 08/15/2014 07:49   Dg Abd 2 Views  08/14/2014   CLINICAL DATA:  Progressive weakness. Postprandial nausea and vomiting.  EXAM: ABDOMEN - 2 VIEW  COMPARISON:  Chest x-ray of the same day.  FINDINGS: Heart is enlarged. Lung bases are clear. The bowel gas pattern is normal. There is no obstruction or free air. Soft tissue granulomas are noted over the abdomen. No fluid levels are present. Degenerative changes are present in the lower lumbar spine, worse on the right. Vascular calcifications are noted.  IMPRESSION: 1. Normal bowel gas pattern. 2. Atherosclerosis. 3. Cardiomegaly without failure.   Electronically Signed   By: Marin Robertshristopher  Mattern M.D.   On: 08/14/2014 14:11   Dg  Foot Complete Left  08/14/2014   CLINICAL DATA:  Trauma to great toe 1 week ago with pain and older.  EXAM: Three views of the left foot.  COMPARISON:  None.  FINDINGS: Marked soft tissue swelling is present in the great toe. There is gas within the soft tissues and ulceration about the nail bed. The distal phalanx is intact. No osseous erosion is present. Moderate osteopenia is noted. Calcifications are noted along the Achilles tendon.  IMPRESSION: 1. Soft tissue ulceration at the level of the nail bed with extensive soft tissue swelling in the great toe. 2. No acute or focal associated osseous abnormality. 3. Moderate osteopenia   Electronically Signed   By: Marin Robertshristopher  Mattern M.D.   On: 08/14/2014 14:09    2D Echo:   Study Conclusions  - Left ventricle: The cavity size was normal. There was mild focal basal hypertrophy of the septum. Systolic function was normal. The estimated ejection fraction was in the range of 55% to 60%. Doppler parameters are consistent with abnormal left ventricular relaxation (grade 1 diastolic dysfunction). The E/e&' ratio is between 8-15, suggesting indeterminate LV filling pressure. - Ventricular septum: Septal motion showed paradox. The contour showed diastolic flattening. - Aortic valve: Trileaflet. Sclerosis without stenosis. There was no regurgitation. - Mitral valve: Mildly thickened leaflets with borderline, late systolic bileaflet prolapse. There was mild regurgitation. - Right ventricle: The cavity size was moderately dilated. The moderator band was prominent. - Right atrium: The atrium was mildly dilated. - Atrial septum: Mobile IAS - cannot exclude PFO. - Tricuspid valve: There was moderate regurgitation. - Pulmonic valve: Poorly visualized. There was moderate regurgitation. - Pulmonary arteries: PA peak pressure: 75 mm Hg (S).  Impressions:  - LVEF 55-60%, mild focal basal septal hypertrophy, diastolic dysfunction,  indeterminate LV filling pressure, moderately dilated RV with normal systolic function, mildly dilated LA, moderate TR and PR with severe pulmonary hypertension (RVSP 75 mmHG), with an elevated RA pressure of 8 mmHg.  Admission HPI:   81 yo female with CAD, systolic CHF, severe MR, HTN, HLD here with n/v for 1 week. Shortly after she eats something, she vomits the foot content. No pain associated with the emesis. She can tolerate liquids including diet soda and coffee but no solid food or even soups. Last tried eating yesterday and vomitted. Did not eat anything today other than having her coffee. Intake has been low for 1 week. Has mild generalized abdominal pain when hungry which is relieved after she eats but then vomits. No blood in  the vomit. No dysuria, diarrhea, fever, chills. No sick contacts or others with similar symptoms.   Despite having n/v and poor PO intake, has been taking her lasix,spironolactone and also all of her other meds.   Denies any hx of gall stones or gall bladder problems. Denies hx of previous cscope or endoscopy. Used to drink heavily but quit 30+ years ago, no hx of ulcers.   She also injured left great toe on a door. Redness, foul smell, and erythema on that toe along with significant pain. Has been cleaning with alcohol but has not seen a doctor for this. Has onychomycosis on toe nails chronically.   Supposed to be getting her meds from Dr. Swaziland but last office visit with Dr. Elvis Coil office 11/2012.   Want to establish PCP at [facility] clinic, used to go to family medicine.  Hospital Course by problem list: Active Problems:   AKI (acute kidney injury)   Hypovolemia   B12 deficiency   Moderate malnutrition   Prediabetes   Cellulitis of great toe of left foot   Hypotension   81 yo female with systolic CHF, CAD, hx of HTN, here with Left great toe cellulitis and N/V.  Left great toe moderate cellulitis- Afebrile though with persistent pain and  intermittent hypotension.  - no fever or white count. Had hypotension initially which improved with 2L fluid boluses then still remains 80-90's. Does have foul smell and fluctuance on exam Xray shows only soft tissue. MRI neg for abscess or osteo. - BCX NGTD. Got ancef 2 days, will switch to keflex and treat for total 10 days.  - wound care consulted - no recs. Apply eucerin cream.  - pain control with Oxy IR 5-10mg  q4hr prn, gave #30 tabs. Will send out with short supply.  - [facility] for reassessment after completion of abx. Wants to go to IM clinic to establish primary care there.  N/V postprandial- likely 2/2 to infection-resolved. Able to tolerate diet. No diarrhea or fever. No ab pain on exam. Also could have duodenal ulcer (pain better with eating but then vomits) Xray abdomen normal. Ab u/s neg other than medical renal disease. H. Pylori pending.   AKI - b/l 0.9, 1.9 on admission, imrpoved to 1.2 now his am with fluids. likely 2/2 to dehydration from poor PO intake due to N/V while still taking diuretics - s/p 2L fluid in ED and 75cc/hr for 10 hours. Required some bolus yesterday. Encouraed PO intake. Hold diuretics and AceI. Be careful with fluids given CHF - monitor BMET outpatient at f/up.  Diastolic Dysfunction grade 1 - Hx of Systolic CHF with severe MR. (ischemic cardiomyopathy) - reported EF 20-35%, on many medications. Volume down, BNP 100.  Used to Follow with Dr. Swaziland outpatient. We repeated an ECHO and it shows that EF is actually 55-60%, and only Mild MR. Diastolic 1 dysfunction.  - home meds include norvasc 10mg  daily, lasix 40mg  daily, metoprolol 50mg  only once day, ramipril 10mg  daily, and spironolactone 25mg  daily. Also on asa 81mg  daily. - cont asa 81mg  daily for now. Hold norvasc, lasix, metprolol, spironolactone, and rampiril with AKI and also low BP. - may restart on clinic follow up one by one if BP  tolerates.  CAD - stable. No anginal symptoms EKG unchanged, trops neg. LHC 05/1999 showed mid LAD 50%, ostial ccx 80%, 99% prior to OM1, then cocluded, proximal RCA occlusion, EF 35-40%. EF then has been 25-30%. not candidate for  revasc per Dr. Elvis Coil note 2012. Cont medical therapy - cont asa and lipitor. Hold rest for now as above.  Chronic macrocytic anemia -b/l hgb 10-11. Now at baseline. 10.7. folate was intermediate and B12 was low normal in the past, iron and ferritin also normal.  - checked B12 and is low - give folic acid and B12 PO. Also gave B12 shot in the hospital - may consider monthly b12 shots but patient has hx of missing appts so PO may be better.  Hx of HTN -now hypotensive, hold hypertension meds as above.  Severe b/l onychomycosis of all toes - outpatient referral to podiatry needed.  HLD - cont zocor  Diet: Advance to heart healthy.    Discharge Vitals:   BP 96/52 mmHg  Pulse 96  Temp(Src) 98.7 F (37.1 C) (Oral)  Resp 18  Ht 4\' 11"  (1.499 m)  Wt 121 lb 3.7 oz (54.991 kg)  BMI 24.47 kg/m2  SpO2 100%  Discharge Labs:  Results for orders placed or performed during the hospital encounter of 08/14/14 (from the past 24 hour(s))  Folate     Status: None   Collection Time: 08/16/14  4:00 AM  Result Value Ref Range   Folate 7.7 >5.9 ng/mL  Basic metabolic panel     Status: Abnormal   Collection Time: 08/16/14  4:00 AM  Result Value Ref Range   Sodium 135 135 - 145 mmol/L   Potassium 4.2 3.5 - 5.1 mmol/L   Chloride 102 101 - 111 mmol/L   CO2 22 22 - 32 mmol/L   Glucose, Bld 74 65 - 99 mg/dL   BUN 29 (H) 6 - 20 mg/dL   Creatinine, Ser 4.54 (H) 0.44 - 1.00 mg/dL   Calcium 8.7 (L) 8.9 - 10.3 mg/dL   GFR calc non Af Amer 42 (L) >60 mL/min   GFR calc Af Amer 48 (L) >60 mL/min   Anion gap 11 5 - 15  Lipase, blood     Status: None   Collection Time: 08/16/14  4:00 AM  Result Value Ref Range   Lipase 39 22 - 51 U/L    Signed: Hyacinth Meeker,  MD 08/16/2014, 2:41 PM    Services Ordered on Discharge:  Equipment Ordered on Discharge:

## 2014-08-16 NOTE — Progress Notes (Signed)
   08/16/14 1452  Clinical Encounter Type  Visited With Patient  Visit Type Initial;Spiritual support;Social support  Referral From Nurse  Consult/Referral To Chaplain  Spiritual Encounters  Spiritual Needs Prayer;Emotional  Referral from RN; patient was receptive to visit; patient is not Jewish but [place]ite faith; spiritual support and prayer offered.

## 2014-08-16 NOTE — Progress Notes (Signed)
Patient seen and examined. Case d/w residents in detail. I agree with findings and plan as documented in Dr. Marcelino FreestoneAhmed's note.  Patient is improving. No fevers. No leukocytosis. Still with pain in right big toe. She is stable to d/c to SNF once bed available. Will d/c ancef and start PO keflex to complete 10 day course. C/w pain control and eucerin cream per wound care. Would also supplement B12 and check for pernicious anemia

## 2014-08-16 NOTE — Progress Notes (Signed)
Patient's IV and telemetry has been discontinued, discharge instructions printed and report given to [facility].  No further questions or problems at this time.  Patient is awaiting EMS transport. Lorretta Harp. Rigby Leonhardt RN

## 2014-08-16 NOTE — Clinical Social Work Note (Signed)
Clinical Social Work Assessment  Patient Details  Name: Karina Willis MRN: [id number] Date of Birth: 05/25/1932  Date of referral:  08/16/14               Reason for consult:  Facility Placement                Permission sought to share information with:  Facility Medical sales representativeContact Representative, Family Supports Permission granted to share information::  Yes, Verbal Permission Granted  Name::     Karina Willis  [phone]   Nephew:  Karina Willis [phone] , [facility]  Agency::     Relationship::     Contact Information:     Housing/Transportation Living arrangements for the past 2 months:  Apartment Source of Information:  Patient Patient Interpreter Needed:  None Criminal Activity/Legal Involvement Pertinent to Current Situation/Hospitalization:  No - Comment as needed Significant Relationships:  Other Family Members (Neice and nephew) Lives with:   Alone- Nephew stays with her most nights if he doesn't stay with friends Do you feel safe going back to the place where you live?  Yes (After rehab) Need for family participation in patient care:  No (Coment) (Patient is alert, oriented. She agrees to have family invovlement as needed)  Care giving concerns:  "My toe hurts really bad- I think the nail needs to come off."  Patient noted to be lying in bed- holding her left ankle and moaning softly at times. Discussed with RN who stated that MD is aware and has ordered pain meds. She will notify MD that meds are not relieving pain.  Patient states that she has lived inpendently in the past but now feels she needs short term SNF.  Social Worker assessment / plan:  81 year old female- referred to CSW for short term SNF. Patient is requesting this as she feels she cannot manage at home right now- unable to walk with her extremely painful left foot.  She states that [facility] and wants to be able to return there for rehab as she liked the care and her  family is close by and can get to see her.  Fl2 completed and placed on chart for MD's signature.  Per Dr. Tasia CatchingsAhmed- patient is medically stable for d/c today. CSW notified Clydie BraunKaren- [facility]- she will review paperwork and feels that she can accept patient.  Employment status:  Retired Database administratornsurance information:  Managed Medicare PT Recommendations:  Skilled Nursing Facility Information / Referral to community resources:  Skilled Nursing Facility  Patient/Family's Response to care:  Patient is accepting of plan for short term SNF and is actually requesting SNF.  Does not feel she can manage independently at this time. She agrees to invovlemen  Patient/Family's Understanding of and Emotional Response to Diagnosis, Current Treatment, and Prognosis:  Patient exhibits as very positive attitude and states that she [facility] a [facility]. She feels that they provided for her medical needs and that her family could get to see her. Patient is extremely worried about her left great toe and expresses that she is have extreme pain in the toe. She wanted CSW to "do something" to relieve the pain. CSW discussed with RN and MD.  Explained role of CSW and that MD is ready to release her to the SNF today. She was very pleased with this.  CSW has been unable to  reach her niece or nephew for comment- however- patient states that she makes her own medical decisions.  Emotional Assessment Appearance:  Appears stated age Attitude/Demeanor/Rapport:   (Very pleasant and cooperative, friendly and appreciative of CSW's assistance with rehab.) Affect (typically observed):  Calm, Happy, Pleasant, Appropriate Orientation:  Oriented to Self, Oriented to Place, Oriented to  Time, Oriented to Situation Alcohol / Substance use:  Tobacco Use (Former smoker) Psych involvement (Current and /or in the community):  No (Comment)  Discharge Needs  Concerns to be addressed:  Care  Coordination Readmission within the last 30 days:  No Current discharge risk:  None Barriers to Discharge:  No Barriers Identified   Darylene Price, LCSW 08/16/2014, 11:00AM

## 2014-08-16 NOTE — Progress Notes (Signed)
EMS is currently on the unit to [facility], patient is stable no signs of distress or pain at this time. Lorretta Harp. Orange Hilligoss RN

## 2014-08-17 LAB — INTRINSIC FACTOR ANTIBODIES: Intrinsic Factor: 1 AU/mL (ref 0.0–1.1)

## 2014-08-18 DIAGNOSIS — R5381 Other malaise: Secondary | ICD-10-CM | POA: Diagnosis not present

## 2014-08-18 DIAGNOSIS — I5022 Chronic systolic (congestive) heart failure: Secondary | ICD-10-CM | POA: Diagnosis not present

## 2014-08-18 DIAGNOSIS — L03032 Cellulitis of left toe: Secondary | ICD-10-CM | POA: Diagnosis not present

## 2014-08-18 DIAGNOSIS — I10 Essential (primary) hypertension: Secondary | ICD-10-CM | POA: Diagnosis not present

## 2014-08-18 DIAGNOSIS — M6281 Muscle weakness (generalized): Secondary | ICD-10-CM | POA: Diagnosis not present

## 2014-08-18 DIAGNOSIS — R2689 Other abnormalities of gait and mobility: Secondary | ICD-10-CM | POA: Diagnosis not present

## 2014-08-18 DIAGNOSIS — I2510 Atherosclerotic heart disease of native coronary artery without angina pectoris: Secondary | ICD-10-CM | POA: Diagnosis not present

## 2014-08-19 LAB — METHYLMALONIC ACID, SERUM: METHYLMALONIC ACID, QUANTITATIVE: 408 nmol/L — AB (ref 0–378)

## 2014-08-21 LAB — CULTURE, BLOOD (ROUTINE X 2)
Culture: NO GROWTH
Culture: NO GROWTH

## 2014-08-22 DIAGNOSIS — R5381 Other malaise: Secondary | ICD-10-CM | POA: Diagnosis not present

## 2014-08-22 DIAGNOSIS — R2689 Other abnormalities of gait and mobility: Secondary | ICD-10-CM | POA: Diagnosis not present

## 2014-08-23 DIAGNOSIS — R2689 Other abnormalities of gait and mobility: Secondary | ICD-10-CM | POA: Diagnosis not present

## 2014-08-23 DIAGNOSIS — R5381 Other malaise: Secondary | ICD-10-CM | POA: Diagnosis not present

## 2014-08-26 ENCOUNTER — Ambulatory Visit: Payer: Medicare Other | Admitting: Cardiology

## 2014-08-30 ENCOUNTER — Encounter: Payer: Self-pay | Admitting: Cardiology

## 2014-09-05 DIAGNOSIS — R5381 Other malaise: Secondary | ICD-10-CM | POA: Diagnosis not present

## 2014-09-05 DIAGNOSIS — R2689 Other abnormalities of gait and mobility: Secondary | ICD-10-CM | POA: Diagnosis not present

## 2014-09-07 ENCOUNTER — Encounter: Payer: Self-pay | Admitting: Vascular Surgery

## 2014-09-07 DIAGNOSIS — L03032 Cellulitis of left toe: Secondary | ICD-10-CM | POA: Diagnosis not present

## 2014-09-09 ENCOUNTER — Other Ambulatory Visit: Payer: Self-pay | Admitting: *Deleted

## 2014-09-09 DIAGNOSIS — I739 Peripheral vascular disease, unspecified: Secondary | ICD-10-CM

## 2014-09-11 ENCOUNTER — Other Ambulatory Visit: Payer: Self-pay | Admitting: Cardiology

## 2014-09-13 ENCOUNTER — Other Ambulatory Visit: Payer: Self-pay

## 2014-09-13 ENCOUNTER — Encounter (HOSPITAL_COMMUNITY): Payer: Medicare Other

## 2014-09-13 ENCOUNTER — Encounter: Payer: Medicare Other | Admitting: Vascular Surgery

## 2014-09-14 ENCOUNTER — Other Ambulatory Visit: Payer: Self-pay

## 2014-09-14 ENCOUNTER — Ambulatory Visit (HOSPITAL_COMMUNITY)
Admission: RE | Admit: 2014-09-14 | Discharge: 2014-09-14 | Disposition: A | Discharge status: Home / Self Care | Payer: Medicare Other | Source: Ambulatory Visit | Attending: Vascular Surgery | Admitting: Vascular Surgery

## 2014-09-14 ENCOUNTER — Ambulatory Visit (INDEPENDENT_AMBULATORY_CARE_PROVIDER_SITE_OTHER): Payer: Medicare Other | Admitting: Vascular Surgery

## 2014-09-14 ENCOUNTER — Encounter: Payer: Self-pay | Admitting: Vascular Surgery

## 2014-09-14 VITALS — BP 112/75 | HR 83 | Temp 98.3°F | Ht 59.0 in | Wt 134.0 lb

## 2014-09-14 DIAGNOSIS — I10 Essential (primary) hypertension: Secondary | ICD-10-CM | POA: Diagnosis not present

## 2014-09-14 DIAGNOSIS — E780 Pure hypercholesterolemia: Secondary | ICD-10-CM | POA: Diagnosis not present

## 2014-09-14 DIAGNOSIS — I96 Gangrene, not elsewhere classified: Secondary | ICD-10-CM

## 2014-09-14 DIAGNOSIS — I739 Peripheral vascular disease, unspecified: Secondary | ICD-10-CM | POA: Diagnosis not present

## 2014-09-14 DIAGNOSIS — D519 Vitamin B12 deficiency anemia, unspecified: Secondary | ICD-10-CM | POA: Diagnosis not present

## 2014-09-14 DIAGNOSIS — I2510 Atherosclerotic heart disease of native coronary artery without angina pectoris: Secondary | ICD-10-CM | POA: Diagnosis not present

## 2014-09-14 NOTE — Progress Notes (Signed)
Patient name: Karina Willis MRN: [id number] DOB: 11/18/1932 Sex: female     Reason for referral:  Chief Complaint  Patient presents with  . New Evaluation    eval open areas L foot- pt states extremely painful    HISTORY OF PRESENT ILLNESS: The patient presents today for evaluation of left foot ischemia. She is a very pleasant alert 81 year old female who has nonhealing ulceration over her left great toe. She had a recent admission to [facility] for overall decompensation and is now in a skilled nursing facility. She reports that she was walking prior to this recent admission and was not able to walk currently due to severe pain in her left foot. She reports that this awakens her at night and is constant. She's had no septic features associated with this wound. No pain in her right foot. She did undergo MRI of her recent admission which did not show any evidence of bone involvement. She is here today with her niece who is her primary caregiver  Past Medical History  Diagnosis Date  . CHF (congestive heart failure)   . Systolic dysfunction     CHRONIC, EF 25-30%  . Mitral insufficiency     SEVERE  . Coronary artery disease 2001    WITH DOCUMENTED TOTAL OCCLUSION OF THE RIGHT CORONARY AND THE LEFT CIRCUMFLEX CORONARY  . SOB (shortness of breath)   . Hypertension   . Hyperlipidemia   . History of TIAs   . Arthritis   . Non compliance w medication regimen   . Neck pain     Past Surgical History  Procedure Laterality Date  . Cardiac catheterization  06/05/1999    ENLARGED LEFT VENTRICULAR SIZE. THERE IS AKINESIA OF THE INFERIOR BASE. LEFT VENTRICULAR  FUNCTIONS APPEAR TO BE MODERATELY REDUCED WITH EF 35-40%  . Appendectomy    . Left arm surgery      History   Social History  . Marital Status: Divorced    Spouse Name: N/A  . Number of Children: 0  . Years of Education: N/A   Occupational History  . waitress     retired   Social History Main Topics  . Smoking  status: Former Smoker    Types: Cigarettes    Quit date: 08/21/1983  . Smokeless tobacco: Never Used  . Alcohol Use: No  . Drug Use: No  . Sexual Activity: Not on file   Other Topics Concern  . Not on file   Social History Narrative    Family History  Problem Relation Age of Onset  . Heart attack Mother   . Heart attack Father   . Heart disease Sister   . Heart attack Brother   . Heart attack Brother   . Heart attack Brother   . Heart disease Sister     Allergies as of 09/14/2014 - Review Complete 09/14/2014  Allergen Reaction Noted  . Penicillins Hives and Itching 07/30/2010    Current Outpatient Prescriptions on File Prior to Visit  Medication Sig Dispense Refill  . acetaminophen (TYLENOL) 325 MG tablet Take 2 tablets (650 mg total) by mouth every 6 (six) hours as needed for moderate pain.    Marland Kitchen. aspirin 81 MG chewable tablet Chew 1 tablet (81 mg total) by mouth daily.    Marland Kitchen. aspirin-acetaminophen-caffeine (EXCEDRIN MIGRAINE) 250-250-65 MG per tablet Take 1 tablet by mouth every 6 (six) hours as needed for headache.    . cephALEXin (KEFLEX) 500 MG capsule Take 1 capsule (  500 mg total) by mouth 4 (four) times daily. 32 capsule 0  . feeding supplement, ENSURE ENLIVE, (ENSURE ENLIVE) LIQD Take 237 mLs by mouth 2 (two) times daily between meals. 60 Bottle 12  . hydrocerin (EUCERIN) CREA Apply 1 application topically 2 (two) times daily. 454 g 3  . nitroGLYCERIN (NITROSTAT) 0.4 MG SL tablet Place 1 tablet (0.4 mg total) under the tongue every 5 (five) minutes as needed for chest pain. Must keep appointment 10/31/14 with Dr Swaziland 25 tablet 0  . oxyCODONE (OXY IR/ROXICODONE) 5 MG immediate release tablet Take 1-2 tablets (5-10 mg total) by mouth every 4 (four) hours as needed for severe pain. 30 tablet 0  . simvastatin (ZOCOR) 20 MG tablet Take 1 tablet (20 mg total) by mouth daily at 6 PM. 30 tablet 2  . vitamin B-12 1000 MCG tablet Take 1 tablet (1,000 mcg total) by mouth daily. 30  tablet 3   No current facility-administered medications on file prior to visit.     REVIEW OF SYSTEMS: Negative except as relates to her race past medical history   PHYSICAL EXAMINATION:  General: The patient is a well-nourished female, in no acute distress. Vital signs are BP 112/75 mmHg  Pulse 83  Temp(Src) 98.3 F (36.8 C) (Oral)  Ht  (1.499 m)  Wt 134 lb (60.782 kg)  BMI 27.05 kg/m2  SpO2 99% Pulmonary: There is a good air exchange  Abdomen: Soft and non-tender with no masses noted Musculoskeletal: There are no major deformities.  There is no significant extremity pain. Neurologic: No focal weakness or paresthesias are detected, Skin: Ischemic changes to the toes of all her feet around the nailbed. Does have a ulceration at the nailbed on the great toe on the left and also more superficial excoriation over the plantar aspect of her great toe Psychiatric: The patient has normal affect. Cardiovascular: Palpable radial pulses bilaterally. 2+ right femoral diminished left femoral pulse. Pedal pulses bilaterally   VVS Vascular Lab Studies:  Ordered and Independently Reviewed monophasic waveforms in tibial vessels bilaterally until calcification making ankle arm indices unreliable.  Impression and Plan:  Ischemic rest pain and tissue loss in her left foot related to severe arterial insufficiency. Discussed this at length with the patient and her family present. I do not feel she has adequate flow for wound healing and has a high risk for amputation. She is quite alert and oriented and I did walk prior to her recent admission and is not able to walk Karina Willis related to pain in her left foot. Have recommended arteriography for further evaluation to determine what the required to improve flow to her left foot for limb salvage. She wishes to avoid amputation at all possible. We will schedule outpatient aortogram with bilateral runoff and possible intervention at her earliest  convenience    Karina Willis [facility] [place] Office: [phone]

## 2014-09-15 ENCOUNTER — Other Ambulatory Visit: Payer: Self-pay | Admitting: *Deleted

## 2014-09-16 ENCOUNTER — Other Ambulatory Visit: Payer: Self-pay | Admitting: *Deleted

## 2014-09-16 MED ORDER — NITROGLYCERIN 0.4 MG SL SUBL: 0.4000 mg | SUBLINGUAL_TABLET | SUBLINGUAL | Status: DC | PRN

## 2014-09-18 DIAGNOSIS — I10 Essential (primary) hypertension: Secondary | ICD-10-CM | POA: Diagnosis not present

## 2014-09-18 DIAGNOSIS — E440 Moderate protein-calorie malnutrition: Secondary | ICD-10-CM | POA: Diagnosis not present

## 2014-09-18 DIAGNOSIS — Z7982 Long term (current) use of aspirin: Secondary | ICD-10-CM | POA: Diagnosis not present

## 2014-09-18 DIAGNOSIS — M810 Age-related osteoporosis without current pathological fracture: Secondary | ICD-10-CM | POA: Diagnosis not present

## 2014-09-18 DIAGNOSIS — I2510 Atherosclerotic heart disease of native coronary artery without angina pectoris: Secondary | ICD-10-CM | POA: Diagnosis not present

## 2014-09-18 DIAGNOSIS — M6281 Muscle weakness (generalized): Secondary | ICD-10-CM | POA: Diagnosis not present

## 2014-09-18 DIAGNOSIS — Z9181 History of falling: Secondary | ICD-10-CM | POA: Diagnosis not present

## 2014-09-18 DIAGNOSIS — Z79891 Long term (current) use of opiate analgesic: Secondary | ICD-10-CM | POA: Diagnosis not present

## 2014-09-18 DIAGNOSIS — D519 Vitamin B12 deficiency anemia, unspecified: Secondary | ICD-10-CM | POA: Diagnosis not present

## 2014-09-18 DIAGNOSIS — I5022 Chronic systolic (congestive) heart failure: Secondary | ICD-10-CM | POA: Diagnosis not present

## 2014-09-20 ENCOUNTER — Ambulatory Visit: Payer: Medicaid Other | Admitting: Podiatry

## 2014-09-20 DIAGNOSIS — I5022 Chronic systolic (congestive) heart failure: Secondary | ICD-10-CM | POA: Diagnosis not present

## 2014-09-20 DIAGNOSIS — Z79891 Long term (current) use of opiate analgesic: Secondary | ICD-10-CM | POA: Diagnosis not present

## 2014-09-20 DIAGNOSIS — M6281 Muscle weakness (generalized): Secondary | ICD-10-CM | POA: Diagnosis not present

## 2014-09-20 DIAGNOSIS — I2510 Atherosclerotic heart disease of native coronary artery without angina pectoris: Secondary | ICD-10-CM | POA: Diagnosis not present

## 2014-09-20 DIAGNOSIS — Z9181 History of falling: Secondary | ICD-10-CM | POA: Diagnosis not present

## 2014-09-20 DIAGNOSIS — D519 Vitamin B12 deficiency anemia, unspecified: Secondary | ICD-10-CM | POA: Diagnosis not present

## 2014-09-20 DIAGNOSIS — Z7982 Long term (current) use of aspirin: Secondary | ICD-10-CM | POA: Diagnosis not present

## 2014-09-20 DIAGNOSIS — I10 Essential (primary) hypertension: Secondary | ICD-10-CM | POA: Diagnosis not present

## 2014-09-20 DIAGNOSIS — M810 Age-related osteoporosis without current pathological fracture: Secondary | ICD-10-CM | POA: Diagnosis not present

## 2014-09-20 DIAGNOSIS — E440 Moderate protein-calorie malnutrition: Secondary | ICD-10-CM | POA: Diagnosis not present

## 2014-09-20 MED ORDER — SODIUM CHLORIDE 0.9 % IV SOLN
INTRAVENOUS | Status: DC
  Administered 2014-09-21: 11:00:00 via INTRAVENOUS

## 2014-09-21 ENCOUNTER — Other Ambulatory Visit: Payer: Self-pay

## 2014-09-21 ENCOUNTER — Encounter (HOSPITAL_COMMUNITY)
Admission: RE | Disposition: A | Discharge status: Home / Self Care | Payer: Medicare Other | Source: Ambulatory Visit | Attending: Vascular Surgery

## 2014-09-21 ENCOUNTER — Ambulatory Visit (HOSPITAL_COMMUNITY)
Admission: RE | Admit: 2014-09-21 | Discharge: 2014-09-21 | Disposition: A | Discharge status: Home / Self Care | Payer: Medicare Other | Source: Ambulatory Visit | Attending: Vascular Surgery | Admitting: Vascular Surgery

## 2014-09-21 DIAGNOSIS — I739 Peripheral vascular disease, unspecified: Secondary | ICD-10-CM | POA: Diagnosis not present

## 2014-09-21 DIAGNOSIS — I2510 Atherosclerotic heart disease of native coronary artery without angina pectoris: Secondary | ICD-10-CM | POA: Insufficient documentation

## 2014-09-21 DIAGNOSIS — Z9114 Patient's other noncompliance with medication regimen: Secondary | ICD-10-CM | POA: Diagnosis not present

## 2014-09-21 DIAGNOSIS — N289 Disorder of kidney and ureter, unspecified: Secondary | ICD-10-CM | POA: Insufficient documentation

## 2014-09-21 DIAGNOSIS — I771 Stricture of artery: Secondary | ICD-10-CM | POA: Diagnosis not present

## 2014-09-21 DIAGNOSIS — Z880 Allergy status to penicillin: Secondary | ICD-10-CM | POA: Insufficient documentation

## 2014-09-21 DIAGNOSIS — I10 Essential (primary) hypertension: Secondary | ICD-10-CM | POA: Insufficient documentation

## 2014-09-21 DIAGNOSIS — I998 Other disorder of circulatory system: Secondary | ICD-10-CM | POA: Diagnosis not present

## 2014-09-21 DIAGNOSIS — I5042 Chronic combined systolic (congestive) and diastolic (congestive) heart failure: Secondary | ICD-10-CM | POA: Insufficient documentation

## 2014-09-21 DIAGNOSIS — Z87891 Personal history of nicotine dependence: Secondary | ICD-10-CM | POA: Insufficient documentation

## 2014-09-21 DIAGNOSIS — Z8673 Personal history of transient ischemic attack (TIA), and cerebral infarction without residual deficits: Secondary | ICD-10-CM | POA: Insufficient documentation

## 2014-09-21 DIAGNOSIS — M1990 Unspecified osteoarthritis, unspecified site: Secondary | ICD-10-CM | POA: Insufficient documentation

## 2014-09-21 DIAGNOSIS — Z7982 Long term (current) use of aspirin: Secondary | ICD-10-CM | POA: Diagnosis not present

## 2014-09-21 DIAGNOSIS — I255 Ischemic cardiomyopathy: Secondary | ICD-10-CM | POA: Diagnosis not present

## 2014-09-21 DIAGNOSIS — I340 Nonrheumatic mitral (valve) insufficiency: Secondary | ICD-10-CM | POA: Diagnosis not present

## 2014-09-21 DIAGNOSIS — E785 Hyperlipidemia, unspecified: Secondary | ICD-10-CM | POA: Diagnosis not present

## 2014-09-21 HISTORY — PX: PERIPHERAL VASCULAR CATHETERIZATION: SHX172C

## 2014-09-21 LAB — POCT I-STAT, CHEM 8
BUN: 28 mg/dL — AB (ref 6–20)
Calcium, Ion: 1.21 mmol/L (ref 1.13–1.30)
Chloride: 106 mmol/L (ref 101–111)
Creatinine, Ser: 1.7 mg/dL — ABNORMAL HIGH (ref 0.44–1.00)
GLUCOSE: 78 mg/dL (ref 65–99)
HCT: 35 % — ABNORMAL LOW (ref 36.0–46.0)
Hemoglobin: 11.9 g/dL — ABNORMAL LOW (ref 12.0–15.0)
Potassium: 4.3 mmol/L (ref 3.5–5.1)
Sodium: 140 mmol/L (ref 135–145)
TCO2: 22 mmol/L (ref 0–100)

## 2014-09-21 SURGERY — LOWER EXTREMITY ANGIOGRAPHY
Laterality: Left

## 2014-09-21 MED ORDER — OXYCODONE-ACETAMINOPHEN 5-325 MG PO TABS: 1.0000 | ORAL_TABLET | ORAL | Status: DC | PRN

## 2014-09-21 MED ORDER — IODIXANOL 320 MG/ML IV SOLN
INTRAVENOUS | Status: DC | PRN
  Administered 2014-09-21: 35 mL via INTRAVENOUS

## 2014-09-21 MED ORDER — FENTANYL CITRATE (PF) 100 MCG/2ML IJ SOLN
INTRAMUSCULAR | Status: DC | PRN
  Administered 2014-09-21: 25 ug via INTRAVENOUS

## 2014-09-21 MED ORDER — SODIUM CHLORIDE 0.9 % IV SOLN: 1.0000 mL/kg/h | INTRAVENOUS | Status: DC

## 2014-09-21 MED ORDER — LIDOCAINE HCL (PF) 1 % IJ SOLN
INTRAMUSCULAR | Status: DC | PRN
  Administered 2014-09-21: 30 mL

## 2014-09-21 MED ORDER — HEPARIN (PORCINE) IN NACL 2-0.9 UNIT/ML-% IJ SOLN
INTRAMUSCULAR | Status: AC
  Filled 2014-09-21: qty 1000

## 2014-09-21 MED ORDER — LIDOCAINE HCL (PF) 1 % IJ SOLN
INTRAMUSCULAR | Status: AC
  Filled 2014-09-21: qty 30

## 2014-09-21 MED ORDER — FENTANYL CITRATE (PF) 100 MCG/2ML IJ SOLN
INTRAMUSCULAR | Status: AC
  Filled 2014-09-21: qty 2

## 2014-09-21 SURGICAL SUPPLY — 12 items
CATH CROSS OVER TEMPO 5F (CATHETERS) ×4 IMPLANT
CATH SOFT-VU 4F 65 STRAIGHT (CATHETERS) ×2 IMPLANT
CATH SOFT-VU STRAIGHT 4F 65CM (CATHETERS) ×2
COVER PRB 48X5XTLSCP FOLD TPE (BAG) ×2 IMPLANT
COVER PROBE 5X48 (BAG) ×2
GUIDEWIRE ANGLED .035X150CM (WIRE) ×4 IMPLANT
KIT PV (KITS) ×4 IMPLANT
SHEATH PINNACLE 5F 10CM (SHEATH) ×4 IMPLANT
SYR MEDRAD MARK V 150ML (SYRINGE) ×4 IMPLANT
TRANSDUCER W/STOPCOCK (MISCELLANEOUS) ×4 IMPLANT
TRAY PV CATH (CUSTOM PROCEDURE TRAY) ×4 IMPLANT
WIRE HITORQ VERSACORE ST 145CM (WIRE) ×4 IMPLANT

## 2014-09-21 NOTE — H&P (View-Only) (Signed)
Patient name: Karina Willis MRN: [id number] DOB: 03/20/1932 Sex: female     Reason for referral:  Chief Complaint  Patient presents with  . New Evaluation    eval open areas L foot- pt states extremely painful    HISTORY OF PRESENT ILLNESS: The patient presents today for evaluation of left foot ischemia. She is a very pleasant alert 81 year old female who has nonhealing ulceration over her left great toe. She had a recent [facility] for overall decompensation and is now in a skilled nursing facility. She reports that she was walking prior to this recent admission and was not able to walk currently due to severe pain in her left foot. She reports that this awakens her at night and is constant. She's had no septic features associated with this wound. No pain in her right foot. She did undergo MRI of her recent admission which did not show any evidence of bone involvement. She is here today with her niece who is her primary caregiver  Past Medical History  Diagnosis Date  . CHF (congestive heart failure)   . Systolic dysfunction     CHRONIC, EF 25-30%  . Mitral insufficiency     SEVERE  . Coronary artery disease 2001    WITH DOCUMENTED TOTAL OCCLUSION OF THE RIGHT CORONARY AND THE LEFT CIRCUMFLEX CORONARY  . SOB (shortness of breath)   . Hypertension   . Hyperlipidemia   . History of TIAs   . Arthritis   . Non compliance w medication regimen   . Neck pain     Past Surgical History  Procedure Laterality Date  . Cardiac catheterization  06/05/1999    ENLARGED LEFT VENTRICULAR SIZE. THERE IS AKINESIA OF THE INFERIOR BASE. LEFT VENTRICULAR  FUNCTIONS APPEAR TO BE MODERATELY REDUCED WITH EF 35-40%  . Appendectomy    . Left arm surgery      History   Social History  . Marital Status: Divorced    Spouse Name: N/A  . Number of Children: 0  . Years of Education: N/A   Occupational History  . waitress     retired   Social History Main Topics  . Smoking  status: Former Smoker    Types: Cigarettes    Quit date: 08/21/1983  . Smokeless tobacco: Never Used  . Alcohol Use: No  . Drug Use: No  . Sexual Activity: Not on file   Other Topics Concern  . Not on file   Social History Narrative    Family History  Problem Relation Age of Onset  . Heart attack Mother   . Heart attack Father   . Heart disease Sister   . Heart attack Brother   . Heart attack Brother   . Heart attack Brother   . Heart disease Sister     Allergies as of 09/14/2014 - Review Complete 09/14/2014  Allergen Reaction Noted  . Penicillins Hives and Itching 07/30/2010    Current Outpatient Prescriptions on File Prior to Visit  Medication Sig Dispense Refill  . acetaminophen (TYLENOL) 325 MG tablet Take 2 tablets (650 mg total) by mouth every 6 (six) hours as needed for moderate pain.    Marland Kitchen. aspirin 81 MG chewable tablet Chew 1 tablet (81 mg total) by mouth daily.    Marland Kitchen. aspirin-acetaminophen-caffeine (EXCEDRIN MIGRAINE) 250-250-65 MG per tablet Take 1 tablet by mouth every 6 (six) hours as needed for headache.    . cephALEXin (KEFLEX) 500 MG capsule Take 1 capsule (  500 mg total) by mouth 4 (four) times daily. 32 capsule 0  . feeding supplement, ENSURE ENLIVE, (ENSURE ENLIVE) LIQD Take 237 mLs by mouth 2 (two) times daily between meals. 60 Bottle 12  . hydrocerin (EUCERIN) CREA Apply 1 application topically 2 (two) times daily. 454 g 3  . nitroGLYCERIN (NITROSTAT) 0.4 MG SL tablet Place 1 tablet (0.4 mg total) under the tongue every 5 (five) minutes as needed for chest pain. Must keep appointment 10/31/14 with Dr Jordan 25 tablet 0  . oxyCODONE (OXY IR/ROXICODONE) 5 MG immediate release tablet Take 1-2 tablets (5-10 mg total) by mouth every 4 (four) hours as needed for severe pain. 30 tablet 0  . simvastatin (ZOCOR) 20 MG tablet Take 1 tablet (20 mg total) by mouth daily at 6 PM. 30 tablet 2  . vitamin B-12 1000 MCG tablet Take 1 tablet (1,000 mcg total) by mouth daily. 30  tablet 3   No current facility-administered medications on file prior to visit.     REVIEW OF SYSTEMS: Negative except as relates to her race past medical history   PHYSICAL EXAMINATION:  General: The patient is a well-nourished female, in no acute distress. Vital signs are BP 112/75 mmHg  Pulse 83  Temp(Src) 98.3 F (36.8 C) (Oral)  Ht 4' 11" (1.499 m)  Wt 134 lb (60.782 kg)  BMI 27.05 kg/m2  SpO2 99% Pulmonary: There is a good air exchange  Abdomen: Soft and non-tender with no masses noted Musculoskeletal: There are no major deformities.  There is no significant extremity pain. Neurologic: No focal weakness or paresthesias are detected, Skin: Ischemic changes to the toes of all her feet around the nailbed. Does have a ulceration at the nailbed on the great toe on the left and also more superficial excoriation over the plantar aspect of her great toe Psychiatric: The patient has normal affect. Cardiovascular: Palpable radial pulses bilaterally. 2+ right femoral diminished left femoral pulse. Pedal pulses bilaterally   VVS Vascular Lab Studies:  Ordered and Independently Reviewed monophasic waveforms in tibial vessels bilaterally until calcification making ankle arm indices unreliable.  Impression and Plan:  Ischemic rest pain and tissue loss in her left foot related to severe arterial insufficiency. Discussed this at length with the patient and her family present. I do not feel she has adequate flow for wound healing and has a high risk for amputation. She is quite alert and oriented and I did walk prior to her recent admission and is not able to walk [name] related to pain in her left foot. Have recommended arteriography for further evaluation to determine what the required to improve flow to her left foot for limb salvage. She wishes to avoid amputation at all possible. We will schedule outpatient aortogram with bilateral runoff and possible intervention at her earliest  convenience    [name] [facility] [place] Office: [phone]         

## 2014-09-21 NOTE — Progress Notes (Signed)
Patient ID: Karina Willis, female   DOB: 11/18/1932, 81 y.o.   MRN: [id number] Discussed angio findings with patient and her niece. Do not feel she is adequate flow for limb salvage without revascularization. Has severe intolerable rest pain and progressive tissue loss in her foot. Feel her best option would be femoral to above-knee popliteal bypass. She does have severe tibial occlusive disease but has good collateralization around this. Have discussed with Dr. Patty SermonsBrackbill will see her prior to discharge today for cardiac evaluation. Had a 2-D echocardiogram suggesting 50-60% ejection fraction during the hospitalization one month ago.

## 2014-09-21 NOTE — Interval H&P Note (Signed)
History and Physical Interval Note:  09/21/2014 12:18 PM  Karina Willis  has presented today for surgery, with the diagnosis of PVD  The various methods of treatment have been discussed with the patient and family. After consideration of risks, benefits and other options for treatment, the patient has consented to  Procedure(s): Abdominal Aortogram w/Lower Extremity (Bilateral) as a surgical intervention .  The patient's history has been reviewed, patient examined, no change in status, stable for surgery.  I have reviewed the patient's chart and labs.  Questions were answered to the patient's satisfaction.     Gretta BeganEarly, Salle Brandle

## 2014-09-21 NOTE — Consult Note (Signed)
CARDIOLOGY CONSULT NOTE   Patient ID: Karina Willis MRN: [id number], DOB/AGE: 79/07/1932   Admit date: 09/21/2014 Date of Consult: 09/21/2014   Primary Physician: Peter SwazilandJordan, MD Primary Cardiologist: Peter SwazilandJordan M.D.  Pt. Profile  81 year old woman with peripheral arterial occlusive disease being evaluated for possible femoral to above-knee popliteal bypass surgery.  She had an outpatient arteriogram today.  Problem List  Past Medical History  Diagnosis Date  . CHF (congestive heart failure)   . Systolic dysfunction     CHRONIC, EF 25-30%  . Mitral insufficiency     SEVERE  . Coronary artery disease 2001    WITH DOCUMENTED TOTAL OCCLUSION OF THE RIGHT CORONARY AND THE LEFT CIRCUMFLEX CORONARY  . SOB (shortness of breath)   . Hypertension   . Hyperlipidemia   . History of TIAs   . Arthritis   . Non compliance w medication regimen   . Neck pain     Past Surgical History  Procedure Laterality Date  . Cardiac catheterization  06/05/1999    ENLARGED LEFT VENTRICULAR SIZE. THERE IS AKINESIA OF THE INFERIOR BASE. LEFT VENTRICULAR  FUNCTIONS APPEAR TO BE MODERATELY REDUCED WITH EF 35-40%  . Appendectomy    . Left arm surgery       Allergies  Allergies  Allergen Reactions  . Penicillins Hives and Itching    Pt reports not being allergic to penicillin Tolerates Ancef    HPI   This pleasant 81 year old African-American has a past history of known ischemic heart disease and a history of mitral regurgitation and is followed by Dr. Peter SwazilandJordan.  She was last seen in cardiology office by Tereso NewcomerScott Weaver on 11/18/12.  He was last seen by Dr. SwazilandJordan in 2013.She has a hx of CAD, ischemic cardiomyopathy, chronic systolic CHF, moderate to severe MR, HTN, HL, prior TIA. LHC 3/01: Mid LAD 50%, ostial circumflex 80%, then 99% prior to OM1, then occluded; proximal RCA occluded, EF 35-40% with inferior AK.  She was felt to be a poor candidate for revascularization as well as mitral valve  surgery and has been treated medically.  Her last echocardiogram on 08/15/14 showed: - Left ventricle: The cavity size was normal. There was mild focal basal hypertrophy of the septum. Systolic function was normal. The estimated ejection fraction was in the range of 55% to 60%. Doppler parameters are consistent with abnormal left ventricular relaxation (grade 1 diastolic dysfunction). The E/e&' ratio is between 8-15, suggesting indeterminate LV filling pressure. - Ventricular septum: Septal motion showed paradox. The contour showed diastolic flattening. - Aortic valve: Trileaflet. Sclerosis without stenosis. There was no regurgitation. - Mitral valve: Mildly thickened leaflets with borderline, late systolic bileaflet prolapse. There was mild regurgitation. - Right ventricle: The cavity size was moderately dilated. The moderator band was prominent. - Right atrium: The atrium was mildly dilated. - Atrial septum: Mobile IAS - cannot exclude PFO. - Tricuspid valve: There was moderate regurgitation. - Pulmonic valve: Poorly visualized. There was moderate regurgitation. - Pulmonary arteries: PA peak pressure: 75 mm Hg (S).  The patient states that she has been feeling well.  She has not been expressing any chest pain or angina pectoris.  She denies any shortness of breath.  She denies any dizziness or syncope or palpitations. She states that recently she injured her left foot when she stumbled against a metal plate at her home.  This has led to a nonhealing ulcer which will require revascularization for limb salvage. Inpatient Medications  Family History Family History  Problem Relation Age of Onset  . Heart attack Mother   . Heart attack Father   . Heart disease Sister   . Heart attack Brother   . Heart attack Brother   . Heart attack Brother   . Heart disease Sister      Social History History   Social History  . Marital Status: Divorced    Spouse Name:  N/A  . Number of Children: 0  . Years of Education: N/A   Occupational History  . waitress     retired   Social History Main Topics  . Smoking status: Former Smoker    Types: Cigarettes    Quit date: 08/21/1983  . Smokeless tobacco: Never Used  . Alcohol Use: No  . Drug Use: No  . Sexual Activity: Not on file   Other Topics Concern  . Not on file   Social History Narrative     Review of Systems  General:  No chills, fever, night sweats or weight changes.  Cardiovascular:  No chest pain, dyspnea on exertion, edema, orthopnea, palpitations, paroxysmal nocturnal dyspnea. Dermatological: No rash, lesions/masses Respiratory: No cough, dyspnea Urologic: No hematuria, dysuria Abdominal:   No nausea, vomiting, diarrhea, bright red blood per rectum, melena, or hematemesis Neurologic:  No visual changes, wkns, changes in mental status. All other systems reviewed and are otherwise negative except as noted above.  Physical Exam  Blood pressure 121/54, pulse 122, temperature 98.1 F (36.7 C), temperature source Oral, resp. rate 15, height 4\' 11"  (1.499 m), weight 137 lb (62.143 kg), SpO2 94 %.  General: Pleasant, NAD.  She has a very jovial spirit Psych: Normal affect. Neuro: Alert and oriented X 3. Moves all extremities spontaneously. HEENT: Normal  Neck: Supple without bruits or JVD. Lungs:  Resp regular and unlabored, CTA. Heart: Regular sinus rhythm.  Occasional bigeminy PVCs on auscultation.  There is a soft apical systolic murmur of mitral regurgitation Abdomen: Soft, non-tender, non-distended, BS + x 4.  Extremities: Left foot is bandaged.  Right groin is stable post arteriogram.  Labs  No results for input(s): CKTOTAL, CKMB, TROPONINI in the last 72 hours. Lab Results  Component Value Date   WBC 10.3 2014/08/30   HGB 11.9* 09/21/2014   HCT 35.0* 09/21/2014   MCV 101.9* 08-30-14   PLT 162 08-30-14     Recent Labs Lab 09/21/14 1047  NA 140  K 4.3  CL 106    BUN 28*  CREATININE 1.70*  GLUCOSE 78   Lab Results  Component Value Date   CHOL 170 11/18/2012   HDL 58.40 11/18/2012   LDLCALC 99 11/18/2012   TRIG 62.0 11/18/2012   No results found for: DDIMER  Radiology/Studies  No results found.  ECG  August 30, 2014 12:20:41 [facility]/ED ROUTINE RECORD Sinus rhythm Low voltage, extremity leads No significant change since last tracing Confirmed by Ethelda Chick MD, SAM [phone]) on Aug 30, 2014 12:57:55 PM Personally reviewed ASSESSMENT AND PLAN  1.  Ischemic heart disease, stable, with history of ischemic cardiomyopathy and chronic combined systolic and diastolic heart failure.  No recent angina or exacerbation of heart failure on current outpatient therapy 2.  Hypertension 3.  Peripheral arterial occlusive disease with nonhealing ulcer left foot   Recommendation: Her ejection fraction has improved on the most recent study one month ago.  Left ventricular ejection is now normal on current therapy.  Would continue current medication.  I believe that the patient is stable and a satisfactory  risk for proposed femoral to popliteal bypass surgery next week.  No further ischemic workup indicated.  Karie Schwalbe MD  09/21/2014, 3:12 PM

## 2014-09-21 NOTE — Discharge Instructions (Signed)

## 2014-09-21 NOTE — Op Note (Signed)
    OPERATIVE REPORT  DATE OF SURGERY: 09/21/2014  PATIENT: Karina Willis, 81 y.o. female MRN: [id number]  DOB: 03/01/1933  PRE-OPERATIVE DIAGNOSIS: Left foot ischemia  POST-OPERATIVE DIAGNOSIS:  Same  PROCEDURE: Left leg arteriogram  SURGEON:  Gretta Beganodd Marguita Venning, M.D.  PHYSICIAN ASSISTANT: Nurse  ANESTHESIA:  1% lidocaine local  EBL: Minimal ml     BLOOD ADMINISTERED: None  DRAINS: None  SPECIMEN: None  COUNTS CORRECT:  YES  PLAN OF CARE: Holding area   PATIENT DISPOSITION:  PACU - hemodynamically stable  PROCEDURE DETAILS: The patient was taken to the perfect cath lab placed supine position where the area of the right left groins were prepped and draped in usual sterile fashion. A using local anesthesia and SonoSite ultrasound visualization the right common femoral artery was entered in a retrograde fashion with an 18-gauge needle and a guidewire was passed centrally. A 5 French sheath was passed over the guidewire and a crossover catheter was used to cross the aortic bifurcation. The first core wire was passed through the crossover catheter would not go beyond the level of the common iliac artery. For this reason a wire was exchanged for an angled Glidewire and this was used to easily go down to the level of the common femoral artery. The crossover catheter would not track over this. This was removed and a 4 [place] endhole catheter was used to position down to the level of the left external iliac artery. A single run was taken using 35 cc of iodinated contrast. This revealed widely patent common femoral artery. There was complete occlusion of the superficial femoral artery at the origin. Did have a large profundus femoris artery with collateralization and the above-knee popliteal artery. The above-knee popliteal was patent as was the popliteal artery. There was a severe tibial disease with all tibial vessels occluded. The anterior tibial at a large collateral that then went to supply  reconstitution of the peroneal artery. The patient tolerated to the complication was a sheath was pulled in the Cath Lab pressures held for hemostasis.  Findings #1 of baseline renal insufficiency with a creatinine today of 1.7 #2 widely patent left iliac system #3 occlusion of the superficial femoral artery at its origin with reconstitution of the above-knee popliteal artery #4 Severe tibial disease with occlusion of all tibial vessels and collateral flow into the peroneal artery.    Gretta Beganodd Adonis Yim, M.D. 09/21/2014 1:07 PM

## 2014-09-22 ENCOUNTER — Encounter (HOSPITAL_COMMUNITY): Payer: Self-pay | Admitting: Vascular Surgery

## 2014-09-22 ENCOUNTER — Telehealth: Payer: Self-pay | Admitting: *Deleted

## 2014-09-22 DIAGNOSIS — Z79891 Long term (current) use of opiate analgesic: Secondary | ICD-10-CM | POA: Diagnosis not present

## 2014-09-22 DIAGNOSIS — I10 Essential (primary) hypertension: Secondary | ICD-10-CM | POA: Diagnosis not present

## 2014-09-22 DIAGNOSIS — D519 Vitamin B12 deficiency anemia, unspecified: Secondary | ICD-10-CM | POA: Diagnosis not present

## 2014-09-22 DIAGNOSIS — Z7982 Long term (current) use of aspirin: Secondary | ICD-10-CM | POA: Diagnosis not present

## 2014-09-22 DIAGNOSIS — M6281 Muscle weakness (generalized): Secondary | ICD-10-CM | POA: Diagnosis not present

## 2014-09-22 DIAGNOSIS — I2510 Atherosclerotic heart disease of native coronary artery without angina pectoris: Secondary | ICD-10-CM | POA: Diagnosis not present

## 2014-09-22 DIAGNOSIS — M810 Age-related osteoporosis without current pathological fracture: Secondary | ICD-10-CM | POA: Diagnosis not present

## 2014-09-22 DIAGNOSIS — Z9181 History of falling: Secondary | ICD-10-CM | POA: Diagnosis not present

## 2014-09-22 DIAGNOSIS — E440 Moderate protein-calorie malnutrition: Secondary | ICD-10-CM | POA: Diagnosis not present

## 2014-09-22 DIAGNOSIS — I5022 Chronic systolic (congestive) heart failure: Secondary | ICD-10-CM | POA: Diagnosis not present

## 2014-09-22 MED FILL — Heparin Sodium (Porcine) 2 Unit/ML in Sodium Chloride 0.9%: INTRAMUSCULAR | Qty: 1000 | Status: AC

## 2014-09-22 NOTE — Telephone Encounter (Signed)
Renee from [facility] called for NPI.  NPI number given x 1 visit for Surgery for foot ulcer.  Clovis PuMartin, Tamika L, RN

## 2014-09-23 ENCOUNTER — Other Ambulatory Visit: Payer: Self-pay

## 2014-09-23 ENCOUNTER — Observation Stay (HOSPITAL_COMMUNITY): Payer: Medicare Other

## 2014-09-23 ENCOUNTER — Encounter (HOSPITAL_COMMUNITY)
Admission: RE | Admit: 2014-09-23 | Discharge: 2014-09-23 | Disposition: A | Discharge status: Home / Self Care | Payer: Medicare Other | Source: Ambulatory Visit | Attending: Vascular Surgery | Admitting: Vascular Surgery

## 2014-09-23 ENCOUNTER — Encounter (HOSPITAL_COMMUNITY): Payer: Self-pay

## 2014-09-23 ENCOUNTER — Inpatient Hospital Stay (HOSPITAL_COMMUNITY)
Admission: EM | Admit: 2014-09-23 | Discharge: 2014-09-28 | DRG: 253 | Disposition: A | Discharge status: Skilled Nursing Facility | Payer: Medicare Other | Attending: Internal Medicine | Admitting: Internal Medicine

## 2014-09-23 ENCOUNTER — Emergency Department (HOSPITAL_COMMUNITY): Payer: Medicare Other

## 2014-09-23 DIAGNOSIS — I129 Hypertensive chronic kidney disease with stage 1 through stage 4 chronic kidney disease, or unspecified chronic kidney disease: Secondary | ICD-10-CM | POA: Diagnosis not present

## 2014-09-23 DIAGNOSIS — Z87891 Personal history of nicotine dependence: Secondary | ICD-10-CM

## 2014-09-23 DIAGNOSIS — I2582 Chronic total occlusion of coronary artery: Secondary | ICD-10-CM | POA: Diagnosis present

## 2014-09-23 DIAGNOSIS — I959 Hypotension, unspecified: Secondary | ICD-10-CM | POA: Diagnosis not present

## 2014-09-23 DIAGNOSIS — I340 Nonrheumatic mitral (valve) insufficiency: Secondary | ICD-10-CM | POA: Diagnosis present

## 2014-09-23 DIAGNOSIS — I70262 Atherosclerosis of native arteries of extremities with gangrene, left leg: Secondary | ICD-10-CM | POA: Diagnosis not present

## 2014-09-23 DIAGNOSIS — Z880 Allergy status to penicillin: Secondary | ICD-10-CM | POA: Diagnosis not present

## 2014-09-23 DIAGNOSIS — Z7982 Long term (current) use of aspirin: Secondary | ICD-10-CM

## 2014-09-23 DIAGNOSIS — Z9114 Patient's other noncompliance with medication regimen: Secondary | ICD-10-CM | POA: Diagnosis not present

## 2014-09-23 DIAGNOSIS — Z79891 Long term (current) use of opiate analgesic: Secondary | ICD-10-CM | POA: Diagnosis not present

## 2014-09-23 DIAGNOSIS — I472 Ventricular tachycardia: Secondary | ICD-10-CM | POA: Diagnosis not present

## 2014-09-23 DIAGNOSIS — Z8673 Personal history of transient ischemic attack (TIA), and cerebral infarction without residual deficits: Secondary | ICD-10-CM | POA: Diagnosis not present

## 2014-09-23 DIAGNOSIS — I739 Peripheral vascular disease, unspecified: Principal | ICD-10-CM | POA: Diagnosis present

## 2014-09-23 DIAGNOSIS — I2510 Atherosclerotic heart disease of native coronary artery without angina pectoris: Secondary | ICD-10-CM | POA: Diagnosis not present

## 2014-09-23 DIAGNOSIS — K5900 Constipation, unspecified: Secondary | ICD-10-CM | POA: Diagnosis present

## 2014-09-23 DIAGNOSIS — S91102D Unspecified open wound of left great toe without damage to nail, subsequent encounter: Secondary | ICD-10-CM | POA: Diagnosis not present

## 2014-09-23 DIAGNOSIS — N183 Chronic kidney disease, stage 3 (moderate): Secondary | ICD-10-CM | POA: Diagnosis present

## 2014-09-23 DIAGNOSIS — R0602 Shortness of breath: Secondary | ICD-10-CM | POA: Diagnosis not present

## 2014-09-23 DIAGNOSIS — E876 Hypokalemia: Secondary | ICD-10-CM | POA: Diagnosis not present

## 2014-09-23 DIAGNOSIS — D519 Vitamin B12 deficiency anemia, unspecified: Secondary | ICD-10-CM | POA: Diagnosis present

## 2014-09-23 DIAGNOSIS — E785 Hyperlipidemia, unspecified: Secondary | ICD-10-CM | POA: Diagnosis present

## 2014-09-23 DIAGNOSIS — Z01812 Encounter for preprocedural laboratory examination: Secondary | ICD-10-CM

## 2014-09-23 DIAGNOSIS — Z452 Encounter for adjustment and management of vascular access device: Secondary | ICD-10-CM | POA: Diagnosis not present

## 2014-09-23 DIAGNOSIS — Z79899 Other long term (current) drug therapy: Secondary | ICD-10-CM

## 2014-09-23 DIAGNOSIS — I5022 Chronic systolic (congestive) heart failure: Secondary | ICD-10-CM | POA: Diagnosis not present

## 2014-09-23 DIAGNOSIS — R531 Weakness: Secondary | ICD-10-CM

## 2014-09-23 DIAGNOSIS — I10 Essential (primary) hypertension: Secondary | ICD-10-CM | POA: Diagnosis not present

## 2014-09-23 DIAGNOSIS — Z01810 Encounter for preprocedural cardiovascular examination: Secondary | ICD-10-CM

## 2014-09-23 DIAGNOSIS — Z01811 Encounter for preprocedural respiratory examination: Secondary | ICD-10-CM

## 2014-09-23 DIAGNOSIS — Z8249 Family history of ischemic heart disease and other diseases of the circulatory system: Secondary | ICD-10-CM | POA: Diagnosis not present

## 2014-09-23 DIAGNOSIS — D539 Nutritional anemia, unspecified: Secondary | ICD-10-CM | POA: Diagnosis not present

## 2014-09-23 DIAGNOSIS — L97529 Non-pressure chronic ulcer of other part of left foot with unspecified severity: Secondary | ICD-10-CM | POA: Diagnosis not present

## 2014-09-23 DIAGNOSIS — E538 Deficiency of other specified B group vitamins: Secondary | ICD-10-CM | POA: Diagnosis present

## 2014-09-23 DIAGNOSIS — I9589 Other hypotension: Secondary | ICD-10-CM | POA: Diagnosis not present

## 2014-09-23 DIAGNOSIS — L8990 Pressure ulcer of unspecified site, unspecified stage: Secondary | ICD-10-CM | POA: Diagnosis present

## 2014-09-23 DIAGNOSIS — Z9889 Other specified postprocedural states: Secondary | ICD-10-CM | POA: Diagnosis not present

## 2014-09-23 DIAGNOSIS — X58XXXD Exposure to other specified factors, subsequent encounter: Secondary | ICD-10-CM | POA: Diagnosis not present

## 2014-09-23 LAB — COMPREHENSIVE METABOLIC PANEL
ALBUMIN: 2.6 g/dL — AB (ref 3.5–5.0)
ALT: 10 U/L — ABNORMAL LOW (ref 14–54)
AST: 14 U/L — AB (ref 15–41)
Alkaline Phosphatase: 46 U/L (ref 38–126)
Anion gap: 11 (ref 5–15)
BILIRUBIN TOTAL: 0.4 mg/dL (ref 0.3–1.2)
BUN: 17 mg/dL (ref 6–20)
CALCIUM: 8.8 mg/dL — AB (ref 8.9–10.3)
CO2: 22 mmol/L (ref 22–32)
Chloride: 106 mmol/L (ref 101–111)
Creatinine, Ser: 1.59 mg/dL — ABNORMAL HIGH (ref 0.44–1.00)
GFR calc non Af Amer: 30 mL/min — ABNORMAL LOW (ref >60)
GFR, EST AFRICAN AMERICAN: 34 mL/min — AB (ref >60)
Glucose, Bld: 108 mg/dL — ABNORMAL HIGH (ref 65–99)
POTASSIUM: 3.9 mmol/L (ref 3.5–5.1)
Sodium: 139 mmol/L (ref 135–145)
Total Protein: 6.4 g/dL — ABNORMAL LOW (ref 6.5–8.1)

## 2014-09-23 LAB — URINALYSIS, ROUTINE W REFLEX MICROSCOPIC
Bilirubin Urine: NEGATIVE
Glucose, UA: NEGATIVE mg/dL
Hgb urine dipstick: NEGATIVE
KETONES UR: NEGATIVE mg/dL
Leukocytes, UA: NEGATIVE
Nitrite: NEGATIVE
PH: 5.5 (ref 5.0–8.0)
PROTEIN: NEGATIVE mg/dL
Specific Gravity, Urine: 1.008 (ref 1.005–1.030)
Urobilinogen, UA: 0.2 mg/dL (ref 0.0–1.0)

## 2014-09-23 LAB — CBC
HEMATOCRIT: 29.8 % — AB (ref 36.0–46.0)
Hemoglobin: 9.8 g/dL — ABNORMAL LOW (ref 12.0–15.0)
MCH: 33.6 pg (ref 26.0–34.0)
MCHC: 32.9 g/dL (ref 30.0–36.0)
MCV: 102.1 fL — AB (ref 78.0–100.0)
PLATELETS: 228 10*3/uL (ref 150–400)
RBC: 2.92 MIL/uL — ABNORMAL LOW (ref 3.87–5.11)
RDW: 14.8 % (ref 11.5–15.5)
WBC: 10.7 10*3/uL — ABNORMAL HIGH (ref 4.0–10.5)

## 2014-09-23 LAB — I-STAT TROPONIN, ED
TROPONIN I, POC: 0.02 ng/mL (ref 0.00–0.08)
Troponin i, poc: 0.02 ng/mL (ref 0.00–0.08)

## 2014-09-23 LAB — ABO/RH: ABO/RH(D): A POS

## 2014-09-23 LAB — MRSA PCR SCREENING: MRSA by PCR: NEGATIVE

## 2014-09-23 LAB — I-STAT CG4 LACTIC ACID, ED
LACTIC ACID, VENOUS: 1.56 mmol/L (ref 0.5–2.0)
Lactic Acid, Venous: 1.85 mmol/L (ref 0.5–2.0)

## 2014-09-23 LAB — POC OCCULT BLOOD, ED: Fecal Occult Bld: NEGATIVE

## 2014-09-23 MED ORDER — SODIUM CHLORIDE 0.9 % IV SOLN
INTRAVENOUS | Status: DC
  Administered 2014-09-23: 14:00:00 via INTRAVENOUS

## 2014-09-23 MED ORDER — SODIUM CHLORIDE 0.9 % IV BOLUS (SEPSIS)
500.0000 mL | Freq: Once | INTRAVENOUS | Status: AC
  Administered 2014-09-23: 500 mL via INTRAVENOUS

## 2014-09-23 MED ORDER — SODIUM CHLORIDE 0.9 % IJ SOLN
3.0000 mL | Freq: Two times a day (BID) | INTRAMUSCULAR | Status: DC
  Administered 2014-09-23: 10 mL via INTRAVENOUS
  Administered 2014-09-24 – 2014-09-28 (×5): 3 mL via INTRAVENOUS

## 2014-09-23 MED ORDER — SODIUM CHLORIDE 0.9 % IV SOLN
INTRAVENOUS | Status: AC
  Administered 2014-09-23: 21:00:00 via INTRAVENOUS

## 2014-09-23 MED ORDER — SODIUM CHLORIDE 0.9 % IV BOLUS (SEPSIS): 500.0000 mL | Freq: Once | INTRAVENOUS | Status: DC

## 2014-09-23 MED ORDER — SIMVASTATIN 20 MG PO TABS
20.0000 mg | ORAL_TABLET | Freq: Every day | ORAL | Status: DC
Start: 2014-09-23 — End: 2014-09-28
  Administered 2014-09-23 – 2014-09-27 (×5): 20 mg via ORAL
  Filled 2014-09-23 (×8): qty 1

## 2014-09-23 MED ORDER — SODIUM CHLORIDE 0.9 % IV SOLN: INTRAVENOUS | Status: DC

## 2014-09-23 MED ORDER — ENOXAPARIN SODIUM 30 MG/0.3ML ~~LOC~~ SOLN
30.0000 mg | SUBCUTANEOUS | Status: DC
  Administered 2014-09-23 – 2014-09-25 (×3): 30 mg via SUBCUTANEOUS
  Filled 2014-09-23 (×4): qty 0.3

## 2014-09-23 MED ORDER — ACETAMINOPHEN 500 MG PO TABS
500.0000 mg | ORAL_TABLET | ORAL | Status: DC | PRN
  Administered 2014-09-23 – 2014-09-27 (×10): 500 mg via ORAL
  Filled 2014-09-23 (×10): qty 1

## 2014-09-23 MED ORDER — ASPIRIN EC 81 MG PO TBEC
81.0000 mg | DELAYED_RELEASE_TABLET | Freq: Every day | ORAL | Status: DC
  Administered 2014-09-24 – 2014-09-28 (×4): 81 mg via ORAL
  Filled 2014-09-23 (×5): qty 1

## 2014-09-23 NOTE — Progress Notes (Deleted)
Date: 09/23/2014               Patient Name:  Karina Willis MRN: [id number]  DOB: [birth year] Age / Sex: 81 y.o., female   PCP: Peter M Swaziland, MD              Medical Service: Internal Medicine Teaching Service              Attending Physician: Dr. Aletta Edouard, MD    First Contact: Payton Emerald, MS4 Pager: [phone]  Second Contact: Dr. Yetta Barre Pager: [phone]       After Hours (After 5p/  First Contact Pager: [phone]  weekends / holidays): Second Contact Pager: [phone]   Chief Complaint: low blood pressure  History of Present Illness: Karina Willis is an 81yo female with PMH of CAD, systolic CHF, HTN, HLD and PAD with left food ischemia who presents from pre-op clinic with lightheadedness and systolic BPs in 70s. She was briefly admitted to IMTS from June 6-7 where she was found to have gangrene of her left great toe and low blood pressure while on multiple medications for HTN. Medications included amlodipine, ramipril, Lasix, metoprolol and spironolactone. She was discharged to a SNF with instructions to stop these BP medications and schedule a follow-up appointment with the [facility] for medication titration. The appointment at [facility] was not scheduled at discharge and no follow-up occurred. The SNF continued to give her BP medications and her niece, her primary caregiver, reports being unaware of these changes when patient was discharged from the facility. Patient has since been taking medications as instructed on the bottles. She now endorses severe lightheadedness when trying to stand requiring her to steady herself on her walker. No h/o syncope or recent falls but does report times when she felt like she was about to pass out. She also reports enuresis, which she attributes to not being able to "get up fast enough" due to the pain from her ischemic toe but not due to orthostatic dizziness. Denies dysuria, abdominal pain, dyspnea and headaches.    Today, she was seen by pre-op in  preparation for a femoral to above-knee popliteal bypass with vascular surgery on Monday for limb salvage of her left toe.   Meds: Current Facility-Administered Medications  Medication Dose Route Frequency Provider Last Rate Last Dose  . 0.9 %  sodium chloride infusion   Intravenous Continuous Cathren Laine, MD   Stopped at 09/23/14 1548  . 0.9 %  sodium chloride infusion   Intravenous STAT Cathren Laine, MD      . sodium chloride 0.9 % bolus 500 mL  500 mL Intravenous Once Cathren Laine, MD      . sodium chloride 0.9 % bolus 500 mL  500 mL Intravenous Once Cathren Laine, MD       Current Outpatient Prescriptions  Medication Sig Dispense Refill  . acetaminophen (TYLENOL) 325 MG tablet Take 2 tablets (650 mg total) by mouth every 6 (six) hours as needed for moderate pain.    Marland Kitchen amLODipine (NORVASC) 10 MG tablet Take 10 mg by mouth daily.  1  . aspirin 325 MG tablet Take 325 mg by mouth daily.    Marland Kitchen aspirin-acetaminophen-caffeine (EXCEDRIN MIGRAINE) 250-250-65 MG per tablet Take 1 tablet by mouth every 6 (six) hours as needed for headache.    . furosemide (LASIX) 40 MG tablet Take 40 mg by mouth daily.  1  . metoprolol (LOPRESSOR) 50 MG tablet Take 50 mg by mouth daily.  1  .  nitroGLYCERIN (NITROSTAT) 0.4 MG SL tablet Place 1 tablet (0.4 mg total) under the tongue every 5 (five) minutes as needed for chest pain. Must keep appointment 10/31/14 with Dr SwazilandJordan 25 tablet 5  . oxyCODONE (OXY IR/ROXICODONE) 5 MG immediate release tablet Take 1-2 tablets (5-10 mg total) by mouth every 4 (four) hours as needed for severe pain. 30 tablet 0  . ramipril (ALTACE) 10 MG capsule Take 10 mg by mouth daily.  1  . simvastatin (ZOCOR) 20 MG tablet Take 1 tablet (20 mg total) by mouth daily at 6 PM. 30 tablet 2  . spironolactone (ALDACTONE) 25 MG tablet Take 25 mg by mouth daily.  1  . cephALEXin (KEFLEX) 500 MG capsule Take 1 capsule (500 mg total) by mouth 4 (four) times daily. 32 capsule 0  . hydrocerin (EUCERIN)  CREA Apply 1 application topically 2 (two) times daily. (Patient not taking: Reported on 09/21/2014) 454 g 3  . vitamin B-12 1000 MCG tablet Take 1 tablet (1,000 mcg total) by mouth daily. (Patient not taking: Reported on 09/21/2014) 30 tablet 3    Allergies: Allergies as of 09/23/2014 - Review Complete 09/23/2014  Allergen Reaction Noted  . Penicillins Hives and Itching 07/30/2010   Past Medical History  Diagnosis Date  . CHF (congestive heart failure)   . Systolic dysfunction     CHRONIC, EF 25-30%  . Mitral insufficiency     SEVERE  . Coronary artery disease 2001    WITH DOCUMENTED TOTAL OCCLUSION OF THE RIGHT CORONARY AND THE LEFT CIRCUMFLEX CORONARY  . SOB (shortness of breath)   . Hypertension   . Hyperlipidemia   . History of TIAs   . Arthritis   . Non compliance w medication regimen   . Neck pain    Past Surgical History  Procedure Laterality Date  . Cardiac catheterization  06/05/1999    ENLARGED LEFT VENTRICULAR SIZE. THERE IS AKINESIA OF THE INFERIOR BASE. LEFT VENTRICULAR  FUNCTIONS APPEAR TO BE MODERATELY REDUCED WITH EF 35-40%  . Appendectomy    . Left arm surgery    . Peripheral vascular catheterization Left 09/21/2014    Procedure: Lower Extremity Angiography;  Surgeon: Larina Earthlyodd F Early, MD;  [facility];  Service: Cardiovascular;  Laterality: Left;   Family History  Problem Relation Age of Onset  . Heart attack Mother   . Heart attack Father   . Heart disease Sister   . Heart attack Brother   . Heart attack Brother   . Heart attack Brother   . Heart disease Sister    History   Social History  . Marital Status: Divorced    Spouse Name: N/A  . Number of Children: 0  . Years of Education: N/A   Occupational History  . waitress     retired   Social History Main Topics  . Smoking status: Former Smoker    Types: Cigarettes    Quit date: 08/21/1983  . Smokeless tobacco: Never Used  . Alcohol Use: No  . Drug Use: No  . Sexual Activity:  Not on file   Other Topics Concern  . Not on file   Social History Narrative    Review of Systems: Pertinent items are noted in HPI.  Physical Exam: General: Alert, cooperative, NAD. HEENT: EOMI. Moist mucus membranes Neck: Full range of motion without pain Lungs: Clear to ascultation bilaterally, normal work of respiration, no wheezes, rales, rhonchi Heart: RRR, distant heart sounds, no murmurs, gallops, or rubs Abdomen:  Soft, non-tender, non-distended, BS + Extremities: No cyanosis, clubbing or edema. Left big toe bandaged.  Neurologic: Alert & oriented X3, cranial nerves grossly II-XII intact, strength grossly intact, sensation intact to light touch  Lab Results:  CBC:  Recent Labs Lab 09/21/14 1047 09/23/14 1410  WBC  --  10.7*  HGB 11.9* 9.8*  HCT 35.0* 29.8*  MCV  --  102.1*  PLT  --  228    Basic Metabolic Panel:  Recent Labs Lab 09/21/14 1047 09/23/14 1410  NA 140 139  K 4.3 3.9  CL 106 106  CO2  --  22  GLUCOSE 78 108*  BUN 28* 17  CREATININE 1.70* 1.59*  CALCIUM  --  8.8*    Liver Function Tests:  Recent Labs Lab 09/23/14 1410  AST 14*  ALT 10*  ALKPHOS 46  BILITOT 0.4  PROT 6.4*  ALBUMIN 2.6*    Micro Results: Recent Results (from the past 240 hour(s))  Blood culture (routine x 2)     Status: None (Preliminary result)   Collection Time: 09/23/14  2:00 PM  Result Value Ref Range Status   Specimen Description BLOOD LEFT ANTECUBITAL  Final   Special Requests BOTTLES DRAWN AEROBIC AND ANAEROBIC 5CC  Final   Culture PENDING  Incomplete   Report Status PENDING  Incomplete    Studies/Results: Dg Chest Port 1 View  09/23/2014   CLINICAL DATA:  Shortness of breath. Hypoxia. Congestive heart failure. Coronary artery disease.  EXAM: PORTABLE CHEST - 1 VIEW  COMPARISON:  08/14/2014  FINDINGS: Low lung volumes are noted. Mild cardiomegaly stable. Both lungs are clear. No evidence of pneumothorax or pleural effusion.  IMPRESSION: Mild  cardiomegaly and low lung volumes.  No acute findings.   Electronically Signed   By: Myles Rosenthal M.D.   On: 09/23/2014 15:43    Other results: EKG: normal EKG, normal sinus rhythm.  Assessment/Plan: Karina Willis is a 81 y.o. yo female with a PMHx of CAD, systolic CHF, HTN, HLD and PAD with left food ischemia who was admitted on 09/23/2014 with hypotension, which was determined to be most likely secondary to overmedication for HTN. Volume depletion may also contribute.  Hypotension   Explains positional lightheadedness and presyncopal sx. Most likely caused by continued use of BP medications despite lower pressures documented last admission. Dehydration may also contribute since her BP responded to 1 bolus of NS and she appears dry. Unfortunately, there have been multiple failed attempts to obtain access. Decompensated CHF is unlikely since echo from 08/15/14 showed a preserved EF 55-60% with grade 1 diastolic dysfunction. UA unremarkable. Pt is also afebrile so infection unlikely cause of hypotension. - HOLD home BP meds - amlodipine, ramipril, Lasix, metoprolol and spironolactone - Will reassess BP tomorrow morning - PICC line or PIV placement by IV team required - NS IV @ 159ml/hr once access obtained - Cardiac monitoring - Encourage PO intake - Orthostatic vitals - BMP, CBC in AM  Gangrenous left great toe 2/2 PAD - surgery on Monday Manage pain with tylenol and tramadol for pain until line placed. Avoid opioids until hypotension resolved.  HLD - stable, outpatient Continue home simvastatin  po daily.  CAD - stable, outpatient.  Follows with Dr. Peter Swaziland (cardiology). Continue aspirin  daily.  FEN - MIVF NS - Replete electrolytes prn - Regular diet  Access: none, need line placed  DVT ppx: lovenox  Code status: FULL  Disposition: Admit to observation with cardiac monitoring.   This is  a Medical Student Note.  The care of the patient was discussed with Dr. Yetta Barre  and the assessment and plan was formulated with their assistance.  Please see their note for official documentation of the patient encounter.   Signed: Ervin Willis, Med Student 09/23/2014, 4:08 PM

## 2014-09-23 NOTE — ED Notes (Signed)
Nurse unavailable to take report at this time.  Will call back. 

## 2014-09-23 NOTE — Progress Notes (Signed)
Pt arrived to PAT appointment and VS were taken ( see documentation). Pt C/O feeling lightheaded in addition to being hypotensive. Spoke with Revonda StandardAllison, PA, anesthesia regarding pt VS and symptoms. Pt sent to ED for further evaluation.

## 2014-09-23 NOTE — Progress Notes (Signed)
Anesthesia Chart Review:  Patient is an 81 year old female scheduled for left FPBG on 10/06/14 by Dr. Arbie CookeyEarly.  She presented to PAT today with hypotension and complaints of feeling light headed.  I was told that BP was 55/35, but 72/42 manual.  She was sent to the ED for further management.  Dr. Arbie CookeyEarly is aware that patient was sent to the ED. He will await ED recommendations, but feels that patient is still in need of surgery for limb salvage. Current ED note by Dr. Denton LankSteinl indicates that patient will be admitted.  Primary cardiologist Dr. SwazilandJordan.  She was evaluated by Dr. Patty SermonsBrackbill on 09/21/14 for a pre-operative evaluation. His note states, "Recommendation: Her ejection fraction has improved on the most recent study one month ago. Left ventricular ejection is now normal on current therapy. Would continue current medication. I believe that the patient is stable and a satisfactory risk for proposed femoral to popliteal bypass surgery next week. No further ischemic workup indicated."  History includes mitral insufficiency (only mild MR by 08/2014 echo),  CAD (by records chronically occluded RCA and LCX '01), ischemic CM, chronic systolic CHF (EF 81-19%35-40% '01, 55-60% 08/2014), pulmonary hypertension (severe by 08/2014 echo), SOB, HLD, HTN, TIA, former smoker.   Meds include amlodipine, ASA, Keflex (never picked up), Lasix, Lopressor, Nitro, oxycodone, ramipril, simvastatin, spironolactone.  ED notes stated BP meds on hold for now until hypotension improves.   09/23/14 EKG: NSR, low voltage.  08/15/14 Echo: LVEF 55-60%, mild focal basal septal hypertrophy, diastolic dysfunction, indeterminate LV filling pressure, moderately dilated RV with normal systolic function, mildly dilated LA,moderate TR and PR with severe pulmonary hypertension (RVSP 75mmHG), with an elevated RA pressure of 8 mmHg. Atrial septum: Mobile IAS - cannot exclude PFO.   06/08/99 LHC/RHC: Mid LAD 50%, ostial circumflex 80%, then 99% prior to  OM1, then occluded; proximal RCA occluded, EF 35-40% with inferior AK, moderate to severe mitral insufficiency, severe pulmonary hypertension with elevated left ventricular filling pressures. She was felt to be a poor candidate for revascularization as well as mitral valve surgery and has been treated medically.  09/23/14 1V CXR: IMPRESSION: Mild cardiomegaly and low lung volumes. No acute findings.  Reviewed what labs results are currently available from today.  Blood cultures are still in process. UA is pending. Troponin and lactic acid levels are WNL so far.   Patient felt satisfactory risk for planned surgery based on evaluation on 09/21/14.  She is now in the ED for hypotension with plans for admission. Plans to proceed will depend on her hospital course.  VVS RN Judeth CornfieldStephanie said that on-call VVS PA would plan to follow-up over the weekend for update.  Velna Ochsllison Jose Corvin, PA-C [facility]Anesthesiology Phone [phone] 09/23/2014 5:03 PM

## 2014-09-23 NOTE — ED Notes (Signed)
Pt was here for preop for operation on left foot that is supposed to take place on Monday.  Pt was found to be hypotensive at 70 systolic.  Pt reports some mild weakness but denies any other symptoms.

## 2014-09-23 NOTE — ED Provider Notes (Signed)
CSN: [id number]     Arrival date & time 09/23/14  1228 History   First MD Initiated Contact with Patient 09/23/14 1233     Chief Complaint  Patient presents with  . Hypotension     (Consider location/radiation/quality/duration/timing/severity/associated sxs/prior Treatment) The history is provided by the patient and a relative.  Patient hx hx chf, peripheral vascular disease, c/o feeling generally weak and lightheaded for the past couple days. Pt went to pre-op testing today, and was told bp low, and sent to ED.  Pt denies fever or chills. No syncope. No focal or unilateral numbness or weakness. Denies headache. No neck pain. No chest pain or discomfort. No sob. No cough or uri c/o. No abd pain. No nvd. No dysuria or gu c/o. Normal appetite, normal po intake.  Has severe pvd, rest pain and chronic ulceration left foot and great toe for which is scheduled for surgery Monday - no acute or abrupt change in pain and/or appearance of foot. No rash or spreading redness up foot or leg. No recent change in meds. Denies blood loss, rectal bleeding or melena.       Past Medical History  Diagnosis Date  . CHF (congestive heart failure)   . Systolic dysfunction     CHRONIC, EF 25-30%  . Mitral insufficiency     SEVERE  . Coronary artery disease 2001    WITH DOCUMENTED TOTAL OCCLUSION OF THE RIGHT CORONARY AND THE LEFT CIRCUMFLEX CORONARY  . SOB (shortness of breath)   . Hypertension   . Hyperlipidemia   . History of TIAs   . Arthritis   . Non compliance w medication regimen   . Neck pain    Past Surgical History  Procedure Laterality Date  . Cardiac catheterization  06/05/1999    ENLARGED LEFT VENTRICULAR SIZE. THERE IS AKINESIA OF THE INFERIOR BASE. LEFT VENTRICULAR  FUNCTIONS APPEAR TO BE MODERATELY REDUCED WITH EF 35-40%  . Appendectomy    . Left arm surgery    . Peripheral vascular catheterization Left 09/21/2014    Procedure: Lower Extremity Angiography;  Surgeon: Larina Earthly, MD;   Location: [facility] INVASIVE CV LAB;  Service: Cardiovascular;  Laterality: Left;   Family History  Problem Relation Age of Onset  . Heart attack Mother   . Heart attack Father   . Heart disease Sister   . Heart attack Brother   . Heart attack Brother   . Heart attack Brother   . Heart disease Sister    History  Substance Use Topics  . Smoking status: Former Smoker    Types: Cigarettes    Quit date: 08/21/1983  . Smokeless tobacco: Never Used  . Alcohol Use: No   OB History    No data available     Review of Systems  Constitutional: Negative for fever and chills.  HENT: Negative for sore throat.   Eyes: Negative for redness.  Respiratory: Negative for cough and shortness of breath.   Cardiovascular: Negative for chest pain.  Gastrointestinal: Negative for vomiting, abdominal pain, diarrhea and blood in stool.  Endocrine: Negative for polyuria.  Genitourinary: Negative for dysuria, flank pain and vaginal bleeding.  Musculoskeletal: Negative for back pain and neck pain.  Skin: Negative for rash.  Neurological: Negative for headaches.  Hematological: Does not bruise/bleed easily.  Psychiatric/Behavioral: Negative for confusion.      Allergies  Penicillins  Home Medications   Prior to Admission medications   Medication Sig Start Date End Date Taking? Authorizing Provider  acetaminophen (TYLENOL) 325 MG tablet Take 2 tablets (650 mg total) by mouth every 6 (six) hours as needed for moderate pain. 08/04/13   Nani Ravens, MD  amLODipine (NORVASC) 10 MG tablet Take 10 mg by mouth daily. 07/25/14   Historical Provider, MD  aspirin 325 MG tablet Take 325 mg by mouth daily.    Historical Provider, MD  aspirin-acetaminophen-caffeine (EXCEDRIN MIGRAINE) [phone] MG per tablet Take 1 tablet by mouth every 6 (six) hours as needed for headache.    Historical Provider, MD  cephALEXin (KEFLEX) 500 MG capsule Take 1 capsule (500 mg total) by mouth 4 (four) times daily. 08/16/14   Tasrif  Ahmed, MD  furosemide (LASIX) 40 MG tablet Take 40 mg by mouth daily. 07/25/14   Historical Provider, MD  hydrocerin (EUCERIN) CREA Apply 1 application topically 2 (two) times daily. Patient not taking: Reported on 09/21/2014 08/16/14   Hyacinth Meeker, MD  metoprolol (LOPRESSOR) 50 MG tablet Take 50 mg by mouth daily. 07/25/14   Historical Provider, MD  nitroGLYCERIN (NITROSTAT) 0.4 MG SL tablet Place 1 tablet (0.4 mg total) under the tongue every 5 (five) minutes as needed for chest pain. Must keep appointment 10/31/14 with Dr Swaziland 09/16/14   Peter M Swaziland, MD  oxyCODONE (OXY IR/ROXICODONE) 5 MG immediate release tablet Take 1-2 tablets (5-10 mg total) by mouth every 4 (four) hours as needed for severe pain. 08/16/14   Tasrif Ahmed, MD  ramipril (ALTACE) 10 MG capsule Take 10 mg by mouth daily. 07/25/14   Historical Provider, MD  simvastatin (ZOCOR) 20 MG tablet Take 1 tablet (20 mg total) by mouth daily at 6 PM. 07/28/14   Peter M Swaziland, MD  spironolactone (ALDACTONE) 25 MG tablet Take 25 mg by mouth daily. 07/25/14   Historical Provider, MD  vitamin B-12 1000 MCG tablet Take 1 tablet (1,000 mcg total) by mouth daily. Patient not taking: Reported on 09/21/2014 08/16/14   Hyacinth Meeker, MD   BP 97/47 mmHg  Pulse 67  Temp(Src) 98.2 F (36.8 C) (Oral)  Resp 16  Ht 4\' 11"  (1.499 m)  Wt 137 lb (62.143 kg)  BMI 27.66 kg/m2  SpO2 99% Physical Exam  Constitutional: She appears well-developed and well-nourished. No distress.  HENT:  Mouth/Throat: Oropharynx is clear and moist.  Eyes: Conjunctivae are normal. No scleral icterus.  Neck: Neck supple. No tracheal deviation present.  No stiffness or rigidity  Cardiovascular: Normal rate, regular rhythm, normal heart sounds and intact distal pulses.  Exam reveals no gallop and no friction rub.   No murmur heard. Pulmonary/Chest: Effort normal and breath sounds normal. No respiratory distress.  Abdominal: Soft. Normal appearance and bowel sounds are normal. She  exhibits no distension. There is no tenderness.  Genitourinary:  No cva tenderness  Musculoskeletal: She exhibits no edema.  Chronic superficial ulcerations to left great toe/toes, no palp pulse. Sensation grossly intact in foot and toes. No gross leg or foot edema. No erythema/cellulitis, or lymphangitis.   Neurological: She is alert.  Skin: Skin is warm and dry. No rash noted.  Psychiatric: She has a normal mood and affect.  Nursing note and vitals reviewed.   ED Course  Procedures (including critical care time) Labs Review  Results for orders placed or performed during the hospital encounter of 09/23/14  Blood culture (routine x 2)  Result Value Ref Range   Specimen Description BLOOD LEFT ANTECUBITAL    Special Requests BOTTLES DRAWN AEROBIC AND ANAEROBIC 5CC    Culture PENDING  Report Status PENDING   CBC  Result Value Ref Range   WBC 10.7 (H) 4.0 - 10.5 K/uL   RBC 2.92 (L) 3.87 - 5.11 MIL/uL   Hemoglobin 9.8 (L) 12.0 - 15.0 g/dL   HCT 16.129.8 (L) 09.636.0 - 04.546.0 %   MCV 102.1 (H) 78.0 - 100.0 fL   MCH 33.6 26.0 - 34.0 pg   MCHC 32.9 30.0 - 36.0 g/dL   RDW 40.914.8 81.111.5 - 91.415.5 %   Platelets 228 150 - 400 K/uL  Comprehensive metabolic panel  Result Value Ref Range   Sodium 139 135 - 145 mmol/L   Potassium 3.9 3.5 - 5.1 mmol/L   Chloride 106 101 - 111 mmol/L   CO2 22 22 - 32 mmol/L   Glucose, Bld 108 (H) 65 - 99 mg/dL   BUN 17 6 - 20 mg/dL   Creatinine, Ser 7.821.59 (H) 0.44 - 1.00 mg/dL   Calcium 8.8 (L) 8.9 - 10.3 mg/dL   Total Protein 6.4 (L) 6.5 - 8.1 g/dL   Albumin 2.6 (L) 3.5 - 5.0 g/dL   AST 14 (L) 15 - 41 U/L   ALT 10 (L) 14 - 54 U/L   Alkaline Phosphatase 46 38 - 126 U/L   Total Bilirubin 0.4 0.3 - 1.2 mg/dL   GFR calc non Af Amer 30 (L) >60 mL/min   GFR calc Af Amer 34 (L) >60 mL/min   Anion gap 11 5 - 15  I-stat troponin, ED  Result Value Ref Range   Troponin i, poc 0.02 0.00 - 0.08 ng/mL   Comment 3          I-Stat CG4 Lactic Acid, ED  Result Value Ref Range    Lactic Acid, Venous 1.56 0.5 - 2.0 mmol/L  Type and screen  Result Value Ref Range   ABO/RH(D) A POS    Antibody Screen NEG    Sample Expiration 09/26/2014   ABO/Rh  Result Value Ref Range   ABO/RH(D) A POS        EKG Interpretation   Date/Time:  Friday September 23 2014 12:36:44 EDT Ventricular Rate:  68 PR Interval:  152 QRS Duration: 76 QT Interval:  430 QTC Calculation: 457 R Axis:   7 Text Interpretation:  Sinus rhythm Low voltage, extremity and precordial  leads No significant change since last tracing Confirmed by Deyanira Fesler  MD,  Caryn BeeKEVIN ([id number]) on 09/23/2014 1:20:58 PM      MDM   Iv ns boluses. Labs.  Reviewed nursing notes and prior charts for additional history.   hgb mildly lower than prior, pt denies blood loss. Stool grossly neg.  abd soft nt.   Pt denies new c/o.   Initially bp improved to upper 90;'s, 100 range. Then back into 80's.  Recheck pt, no new c/o. No fever or chills. Chest cta. abd soft nt.  Labs pending.   Of note, in discharge summary from 6/16, pt w hypotension, was d/c'd w blood pressure in 90's, and on same/multiple blood pressure medications.  For now, will hold bp meds, and would ?re-starting them.  Given bp in 80s, will admit to med service. ua still pending.      Cathren LaineKevin Tamitha Norell, MD 09/23/14 [phone]

## 2014-09-23 NOTE — ED Notes (Signed)
Failed IV attempt x2.  IV team consult ordered.

## 2014-09-23 NOTE — Progress Notes (Signed)
Pt arrived to unit with no iv access. AAOx4, NAD, VSS, pt on RA at this time. Will cont to monitor, ED RN spoke with PICC team, and notified MD.

## 2014-09-23 NOTE — ED Notes (Signed)
Jones MD returned page and reports that he does not wish to place a central line at this time.  IV team number provided to MD per request and no further orders received.

## 2014-09-23 NOTE — ED Notes (Signed)
ED MD made aware of blood pressure

## 2014-09-23 NOTE — ED Notes (Signed)
Admitting MD at bedside.  RN spoke with MD regarding lack of access and multiple failed attempts to obtain access.  Admitting MD requests that RN contact vascular lab to see if a PICC line would be possible.

## 2014-09-23 NOTE — H&P (Signed)
Date: 09/23/2014               Patient Name:  Karina Willis MRN: [id number]  DOB: [birth year] Age / Sex: 81 y.o., female   PCP: Peter M Swaziland, MD         Medical Service: Internal Medicine Teaching Service         Attending Physician: Dr. Aletta Edouard, MD    First Contact: Payton Emerald Pager: [phone]  Second Contact: Dr. Yetta Barre Pager: [phone]       After Hours (After 5p/  First Contact Pager: [phone]  weekends / holidays): Second Contact Pager: [phone]   Chief Complaint: Dizziness  History of Present Illness: Karina Willis is a 81 y.o. female w/ PMHx of chronic sCHF (EF 25-30%), CAD, HTN, and HLD, presents to the ED w/ complaints of dizziness. Patient states she was at pre-op appointment (for upcoming left fem-pop bypass) and has some dizziness and lightheadedness. BP checked at that time found to be in the 60-70's systolic and patient was taken to the ED. Of note, patient was admitted to the hospital in 08/2014 w/ similar issues involving hypotension at which time her BP medications (Norvasc, Spironolactone, Metoprolol, and Ramipril) her held on discharge until she followed up w/ her new PCP [facility] clinic), however, this information was not appropriately communicated to her (according to the patient) and she did not make it to her follow up appointment and continued to take all of her BP medications.Patient denies fever, chills, cough, worsening SOB, nausea, vomiting, abdominal pain. Patient has a known left great toe infection related to PVD and had arteriogram on 09/21/14 which showed occlusion of the superficial femoral artery. Patient is scheduled for a fem-pop bypass on 09/26/14.    Meds: Current Facility-Administered Medications  Medication Dose Route Frequency Provider Last Rate Last Dose  . 0.9 %  sodium chloride infusion   Intravenous Continuous Courtney Paris, MD      . Melene Muller ON 09/24/2014] aspirin EC tablet 81 mg  81 mg Oral Daily Courtney Paris, MD      . enoxaparin  (LOVENOX) injection 30 mg  30 mg Subcutaneous Q24H Courtney Paris, MD      . simvastatin (ZOCOR) tablet 20 mg  20 mg Oral q1800 Courtney Paris, MD      . sodium chloride 0.9 % injection 3 mL  3 mL Intravenous Q12H Courtney Paris, MD        Allergies: Allergies as of 09/23/2014 - Review Complete 09/23/2014  Allergen Reaction Noted  . Penicillins Hives and Itching 07/30/2010   Past Medical History  Diagnosis Date  . CHF (congestive heart failure)   . Systolic dysfunction     CHRONIC, EF 25-30%  . Mitral insufficiency     SEVERE  . Coronary artery disease 2001    WITH DOCUMENTED TOTAL OCCLUSION OF THE RIGHT CORONARY AND THE LEFT CIRCUMFLEX CORONARY  . SOB (shortness of breath)   . Hypertension   . Hyperlipidemia   . History of TIAs   . Arthritis   . Non compliance w medication regimen   . Neck pain    Past Surgical History  Procedure Laterality Date  . Cardiac catheterization  06/05/1999    ENLARGED LEFT VENTRICULAR SIZE. THERE IS AKINESIA OF THE INFERIOR BASE. LEFT VENTRICULAR  FUNCTIONS APPEAR TO BE MODERATELY REDUCED WITH EF 35-40%  . Appendectomy    . Left arm surgery    . Peripheral vascular catheterization Left 09/21/2014  Procedure: Lower Extremity Angiography;  Surgeon: Larina Earthly, MD;  Location: [facility] INVASIVE CV LAB;  Service: Cardiovascular;  Laterality: Left;   Family History  Problem Relation Age of Onset  . Heart attack Mother   . Heart attack Father   . Heart disease Sister   . Heart attack Brother   . Heart attack Brother   . Heart attack Brother   . Heart disease Sister    History   Social History  . Marital Status: Divorced    Spouse Name: N/A  . Number of Children: 0  . Years of Education: N/A   Occupational History  . waitress     retired   Social History Main Topics  . Smoking status: Former Smoker    Types: Cigarettes    Quit date: 08/21/1983  . Smokeless tobacco: Never Used  . Alcohol Use: No  . Drug Use: No  . Sexual Activity: Not on file    Other Topics Concern  . Not on file   Social History Narrative    Review of Systems:  General: Denies fever, diaphoresis, appetite change, and fatigue.  Respiratory: Denies SOB, cough, and wheezing.   Cardiovascular: Denies chest pain and palpitations.  Gastrointestinal: Denies nausea, vomiting, abdominal pain, and diarrhea Musculoskeletal: Denies myalgias, arthralgias, back pain, and gait problem.  Neurological: Positive for dizziness, lightheadedness. Denies syncope, weakness, and headaches.  Psychiatric/Behavioral: Denies mood changes, sleep disturbance, and agitation.   Physical Exam: Blood pressure 99/59, pulse 76, temperature 98.2 F (36.8 C), temperature source Oral, resp. rate 21, height  (1.499 m), weight 137 lb (62.143 kg), SpO2 100 %.  General: AA female, alert, cooperative, NAD. HEENT: PERRL, EOMI. Moist mucus membranes Neck: Full range of motion without pain, supple, no lymphadenopathy or carotid bruits Lungs: Clear to ascultation bilaterally, normal work of respiration, no wheezes, rales, rhonchi Heart: RRR, no murmurs, gallops, or rubs Abdomen: Soft, non-tender, non-distended, BS + Extremities: No cyanosis, clubbing, or edema. Left great toe w/ ulceration, covered in dressing. Uncut toenails. Obvious onychomycosis. Weak distal pulses bilaterally.  Neurologic: Alert & oriented x3, cranial nerves II-XII intact, strength grossly intact, sensation intact to light touch  Lab results: Basic Metabolic Panel:  Recent Labs  40/98/11 1047 09/23/14 1410  NA 140 139  K 4.3 3.9  CL 106 106  CO2  --  22  GLUCOSE 78 108*  BUN 28* 17  CREATININE 1.70* 1.59*  CALCIUM  --  8.8*   Liver Function Tests:  Recent Labs  09/23/14 1410  AST 14*  ALT 10*  ALKPHOS 46  BILITOT 0.4  PROT 6.4*  ALBUMIN 2.6*   CBC:  Recent Labs  09/21/14 1047 09/23/14 1410  WBC  --  10.7*  HGB 11.9* 9.8*  HCT 35.0* 29.8*  MCV  --  102.1*  PLT  --  228   Urine Drug  Screen: Drugs of Abuse     Component Value Date/Time   LABOPIA NONE DETECTED 08/02/2013 0926   COCAINSCRNUR NONE DETECTED 08/02/2013 0926   LABBENZ NONE DETECTED 08/02/2013 0926   AMPHETMU NONE DETECTED 08/02/2013 0926   THCU NONE DETECTED 08/02/2013 0926   LABBARB NONE DETECTED 08/02/2013 0926    Imaging results:  Dg Chest Port 1 View  09/23/2014   CLINICAL DATA:  Shortness of breath. Hypoxia. Congestive heart failure. Coronary artery disease.  EXAM: PORTABLE CHEST - 1 VIEW  COMPARISON:  08/14/2014  FINDINGS: Low lung volumes are noted. Mild cardiomegaly stable. Both lungs are clear. No evidence  of pneumothorax or pleural effusion.  IMPRESSION: Mild cardiomegaly and low lung volumes.  No acute findings.   Electronically Signed   By: Myles RosenthalJohn  Stahl M.D.   On: 09/23/2014 15:43    Other results: EKG: NSR, low voltage. Inferior Q waves (seen on previous EKG).   Assessment & Plan by Problem: Karina Willis is a [age] y.o. female w/ PMHx of chronic sCHF (EF 25-30%), CAD, HTN, and HLD, admitted for hypotension.   Hypotension: Most likely related to BP medications. Patient previously admitted for hypotension in 08/2014 at which time her BP medications were stopped until she followed up w/ her PCP. Patient did not understand this and continued to take her BP medications after her discharge. These medications included Spironolactone, Amlodipine, Metoprolol, Ramipril, and Lasix. Today, she went for preop clearance and had dizziness and lightheadedness and her BP was found to be in the 60-70's systolic. At that time she was taken to the ED. No signs infection; UA normal, CXR w. No obvious infiltrate. Patient does have a left great toe ulcer likely related to vascular disease, however, not grossly abnormal from recent past. Do not suspect this is causing hypotension. Mild leukocytosis of 10.7, lactic acid 1.85 most recently.  -Admit to stepdown -HOLD BP medications -Place RIJ CVL for IV access. Will start  NS @ 125 cc/hr for now -Repeat CBC in AM; if increased leukocytosis, can consider starting ABx for foot infection.  Left Great Toe Wound: Has been a chronic issue, related to vascular disease. Has been previously treated for this w/ Keflex. Do not feel this is contributing to her clinical picture at this time.  -Wound care consult -Can consider ABx in AM if increased WBC's  PVD: Patient recently went for left leg arteriogram which was significant for superficial femoral artery occlusion. She is currently scheduled for left fem-pop bypass on Monday w/ Dr. Arbie CookeyEarly. He is aware patient has been admitted.  -Will discuss w/ VVS this weekend if it is thought she will not be discharged prior to Monday.   HTN: As above -Hold home medications  HLD: Stable.  -Continue Zocor  CAD: Significant LCx disease. Follows w/ Dr. SwazilandJordan. Denies chest pain today.  -Continue ASA + Zocor  DVT/PE PPx: Lovenox  Dispo: Disposition is deferred at this time, awaiting improvement of current medical problems. Anticipated discharge in approximately 1-2 day(s).   The patient does have a current PCP (Peter M SwazilandJordan, MD) and [facility] after discharge.  The patient does have transportation limitations that hinder transportation to clinic appointments.  Signed: Courtney ParisEden W Alaria Oconnor, MD 09/23/2014, 5:58 PM

## 2014-09-23 NOTE — ED Notes (Signed)
Pt requested that dressing from left great toe be removed due to pain.  Vasoline gauze remain in place.  IV team was unsuccessful at IV attempt.

## 2014-09-23 NOTE — ED Notes (Signed)
IV team returned page and reported that they wouldn't be able to place PICC until tomorrow morning.  Admitting MD paged to be made aware.

## 2014-09-23 NOTE — ED Notes (Signed)
Admitting MD made aware of blood pressure

## 2014-09-23 NOTE — Pre-Procedure Instructions (Signed)
Karina Willis  09/23/2014      [facility] - [place], [place] - [address] [name] [id number] Phone: [phone] Fax: [phone]  [facility] [address]- [place], [place] - [address] [facility] [address] [place] [id number] Phone: [phone] Fax: [phone]    Your procedure is scheduled on Monday, September 26, 2014  [facility]:30 [facility].M.  Call this number if you have problems the morning of surgery:  [phone]   Remember:  Do not eat food or drink liquids after midnight Sunday, September 25, 2014  Take these medicines the morning of surgery with A SIP OF WATER: amLODipine (NORVASC), metoprolol (LOPRESSOR), if needed: pain medication, Nitrostat for chest pain  Stop taking vitamins and herbal medications. Do not take any NSAIDs ie: Ibuprofen, Advil, Naproxen or any EXCEDRIN MIGRAINE; stop now   Do not wear jewelry, make-up or nail polish.  Do not wear lotions, powders, or perfumes.  You may not wear deodorant.  Do not shave 48 hours prior to surgery.    Do not bring valuables to the hospital.  [facility] is not responsible for any belongings or valuables.  Contacts, dentures or bridgework may not be worn into surgery.  Leave your suitcase in the car.  After surgery it may be brought to your room.  For patients admitted to the hospital, discharge time will be determined by your treatment team.  Patients discharged the day of surgery will not be allowed to drive home.   Name and phone number of your driver:   Special instructions:  [place] - Preparing for Surgery  Before surgery, you can play an important role.  Because skin is not sterile, your skin needs to be as free of germs as possible.  You can reduce the number of germs on you skin by washing with CHG (chlorahexidine gluconate) soap before surgery.  CHG is an antiseptic cleaner which kills germs and bonds  with the skin to continue killing germs even after washing.  Please DO NOT use if you have an allergy to CHG or antibacterial soaps.  If your skin becomes reddened/irritated stop using the CHG and inform your nurse when you arrive at Short Stay.  Do not shave (including legs and underarms) for at least 48 hours prior to the first CHG shower.  You may shave your face.  Please follow these instructions carefully:   1.  Shower with CHG Soap the night before surgery and the morning of Surgery.  2.  If you choose to wash your hair, wash your hair first as usual with your normal shampoo.  3.  After you shampoo, rinse your hair and body thoroughly to remove the Shampoo.  4.  Use CHG as you would any other liquid soap.  You can apply chg directly  to the skin and wash gently with scrungie or a clean washcloth.  5.  Apply the CHG Soap to your body ONLY FROM THE NECK DOWN.  Do not use on open wounds or open sores.  Avoid contact with your eyes, ears, mouth and genitals (private parts).  Wash genitals (private parts) with your normal soap.  6.  Wash thoroughly, paying special attention to the area where your surgery will be performed.  7.  Thoroughly rinse your body with warm water from the neck down.  8.  DO NOT shower/wash  with your normal soap after using and rinsing off the CHG Soap.  9.  Pat yourself dry with a clean towel.            10.  Wear clean pajamas.            11.  Place clean sheets on your bed the night of your first shower and do not sleep with pets.  Day of Surgery  Do not apply any lotions/deodorants the morning of surgery.  Please wear clean clothes to the hospital/surgery Willis.  Please read over the following fact sheets that you were given. Pain Booklet, Coughing and Deep Breathing, Blood Transfusion Information, MRSA Information and Surgical Site Infection Prevention

## 2014-09-23 NOTE — Procedures (Signed)
Name:  Karina Silviusorma J Lavoy MRN:  [id number] DOB:  10/12/1932  PROCEDURE NOTE  Procedure:  Ultrasound-guided RIJ central venous catheter placement.  Indications:  Need for intravenous access and hemodynamic monitoring.  Consent:  Consent obtained from patient.   Anesthesia:  A total of 10 mL of 1% Lidocaine was used for local infiltration anesthesia.  Procedure summary:  Appropriate equipment was assembled.  The patient was identified as Karina Willis and safety timeout was performed. The patient was placed in Trendelenburg position.  Sterile technique was used. The patient's right neck region was prepped using chlorhexidine / alcohol scrub and the field was draped in usual sterile fashion with full body drape. The right internal jugular vein and the right carotid artery were identified by ultrasound, the patency was evaluated and images were documented. After the adequate anesthesia was achieved, the vein was cannulated with the introducer needle under sonographic guidance without difficulty. A guide wire was advanced through the introducer needle, which was then withdrawn. A small skin incision was made at the point of wire entry, the dilator was inserted over the guide wire and appropriate dilation was obtained. The dilator was removed and triple-lumen catheter was advanced over the guide wire.  All ports were aspirated and flushed with normal saline without difficulty. The catheter was secured into place with sutures at 16 cm. Antibiotic patch was placed and sterile dressing was applied. Post-procedure chest x-ray was ordered.  Complications:  No immediate complications were noted.  Hemodynamic parameters and oxygenation remained stable throughout the procedure.  Estimated blood loss:  Less then 5 mL.  Lauris ChromanWoody Kemper Hochman, MD PGY-3, Internal Medicine Pager: [phone]  09/23/2014, 9:02 PM

## 2014-09-24 DIAGNOSIS — I739 Peripheral vascular disease, unspecified: Secondary | ICD-10-CM | POA: Diagnosis not present

## 2014-09-24 DIAGNOSIS — I10 Essential (primary) hypertension: Secondary | ICD-10-CM

## 2014-09-24 DIAGNOSIS — S91102D Unspecified open wound of left great toe without damage to nail, subsequent encounter: Secondary | ICD-10-CM | POA: Diagnosis not present

## 2014-09-24 DIAGNOSIS — X58XXXD Exposure to other specified factors, subsequent encounter: Secondary | ICD-10-CM | POA: Diagnosis not present

## 2014-09-24 DIAGNOSIS — I2510 Atherosclerotic heart disease of native coronary artery without angina pectoris: Secondary | ICD-10-CM

## 2014-09-24 DIAGNOSIS — E785 Hyperlipidemia, unspecified: Secondary | ICD-10-CM

## 2014-09-24 DIAGNOSIS — I9589 Other hypotension: Secondary | ICD-10-CM | POA: Diagnosis not present

## 2014-09-24 DIAGNOSIS — Z7982 Long term (current) use of aspirin: Secondary | ICD-10-CM

## 2014-09-24 DIAGNOSIS — E876 Hypokalemia: Secondary | ICD-10-CM

## 2014-09-24 LAB — BASIC METABOLIC PANEL
Anion gap: 8 (ref 5–15)
BUN: 17 mg/dL (ref 6–20)
CALCIUM: 8.1 mg/dL — AB (ref 8.9–10.3)
CO2: 24 mmol/L (ref 22–32)
CREATININE: 1.35 mg/dL — AB (ref 0.44–1.00)
Chloride: 107 mmol/L (ref 101–111)
GFR, EST AFRICAN AMERICAN: 42 mL/min — AB (ref >60)
GFR, EST NON AFRICAN AMERICAN: 36 mL/min — AB (ref >60)
Glucose, Bld: 88 mg/dL (ref 65–99)
Potassium: 3.2 mmol/L — ABNORMAL LOW (ref 3.5–5.1)
Sodium: 139 mmol/L (ref 135–145)

## 2014-09-24 LAB — CBC
HCT: 25.9 % — ABNORMAL LOW (ref 36.0–46.0)
Hemoglobin: 8.5 g/dL — ABNORMAL LOW (ref 12.0–15.0)
MCH: 34 pg (ref 26.0–34.0)
MCHC: 32.8 g/dL (ref 30.0–36.0)
MCV: 103.6 fL — AB (ref 78.0–100.0)
Platelets: 208 10*3/uL (ref 150–400)
RBC: 2.5 MIL/uL — ABNORMAL LOW (ref 3.87–5.11)
RDW: 14.8 % (ref 11.5–15.5)
WBC: 7.7 10*3/uL (ref 4.0–10.5)

## 2014-09-24 LAB — MAGNESIUM: Magnesium: 1.7 mg/dL (ref 1.7–2.4)

## 2014-09-24 MED ORDER — SODIUM CHLORIDE 0.9 % IV BOLUS (SEPSIS)
500.0000 mL | Freq: Once | INTRAVENOUS | Status: AC
  Administered 2014-09-24: 500 mL via INTRAVENOUS

## 2014-09-24 MED ORDER — MAGNESIUM SULFATE IN D5W 10-5 MG/ML-% IV SOLN
1.0000 g | Freq: Once | INTRAVENOUS | Status: AC
  Administered 2014-09-24: 1 g via INTRAVENOUS
  Filled 2014-09-24: qty 100

## 2014-09-24 MED ORDER — SODIUM CHLORIDE 0.9 % IJ SOLN
10.0000 mL | Freq: Two times a day (BID) | INTRAMUSCULAR | Status: DC
  Administered 2014-09-24: 20 mL
  Administered 2014-09-25: 10 mL
  Administered 2014-09-26: 20 mL
  Administered 2014-09-27 – 2014-09-28 (×2): 10 mL

## 2014-09-24 MED ORDER — OXYCODONE HCL 5 MG PO TABS
5.0000 mg | ORAL_TABLET | Freq: Once | ORAL | Status: AC
  Administered 2014-09-24: 5 mg via ORAL
  Filled 2014-09-24: qty 1

## 2014-09-24 MED ORDER — SODIUM CHLORIDE 0.9 % IV SOLN
INTRAVENOUS | Status: AC
  Administered 2014-09-24: 12:00:00 via INTRAVENOUS

## 2014-09-24 MED ORDER — VITAMIN B-12 1000 MCG PO TABS
1000.0000 ug | ORAL_TABLET | Freq: Every day | ORAL | Status: DC
  Administered 2014-09-24 – 2014-09-28 (×4): 1000 ug via ORAL
  Filled 2014-09-24 (×5): qty 1

## 2014-09-24 MED ORDER — SODIUM CHLORIDE 0.9 % IJ SOLN
10.0000 mL | INTRAMUSCULAR | Status: DC | PRN
  Administered 2014-09-24 – 2014-09-28 (×4): 10 mL
  Administered 2014-09-28: 30 mL
  Filled 2014-09-24 (×3): qty 40

## 2014-09-24 MED ORDER — POTASSIUM CHLORIDE CRYS ER 20 MEQ PO TBCR
40.0000 meq | EXTENDED_RELEASE_TABLET | Freq: Once | ORAL | Status: AC
  Administered 2014-09-24: 40 meq via ORAL
  Filled 2014-09-24: qty 2

## 2014-09-24 NOTE — Progress Notes (Signed)
Pt refusing orthostatic BP.

## 2014-09-24 NOTE — Progress Notes (Addendum)
Subjective: Doing well, no significant complaints. Denies dizziness, lightheadedness, chest pain, or SOB.   Objective: Vital signs in last 24 hours: Filed Vitals:   09/24/14 0200 09/24/14 0300 09/24/14 0402 09/24/14 0724  BP: 115/50 108/46 121/61 98/45  Pulse:      Temp:   98.7 F (37.1 C) 98.9 F (37.2 C)  TempSrc:   Oral Oral  Resp: Height:      Weight:      SpO2:  96% 94% 95%   Weight change:   Intake/Output Summary (Last 24 hours) at 09/24/14 0916 Last data filed at 09/24/14 0900  Gross per 24 hour  Intake 1745.83 ml  Output    450 ml  Net 1295.83 ml   Physical Exam: General: AA female, alert, cooperative, NAD. HEENT: PERRL, EOMI. Moist mucus membranes Neck: Full range of motion without pain, supple, no lymphadenopathy or carotid bruits Lungs: Clear to ascultation bilaterally, normal work of respiration, no wheezes, rales, rhonchi Heart: RRR, no murmurs, gallops, or rubs Abdomen: Soft, non-tender, non-distended, BS + Extremities: No cyanosis, clubbing, or edema. Left great toe w/ ulceration, covered in dressing. Uncut toenails. Obvious onychomycosis. Weak distal pulses bilaterally.  Neurologic: Alert & oriented x3, cranial nerves II-XII intact, strength grossly intact, sensation intact to light touch   Lab Results: Basic Metabolic Panel:  Recent Labs Lab 09/23/14 1410 09/24/14 0445  NA 139 139  K 3.9 3.2*  CL 106 107  CO2 22 24  GLUCOSE 108* 88  BUN 17 17  CREATININE 1.59* 1.35*  CALCIUM 8.8* 8.1*  MG  --  1.7   Liver Function Tests:  Recent Labs Lab 09/23/14 1410  AST 14*  ALT 10*  ALKPHOS 46  BILITOT 0.4  PROT 6.4*  ALBUMIN 2.6*   CBC:  Recent Labs Lab 09/23/14 1410 09/24/14 0445  WBC 10.7* 7.7  HGB 9.8* 8.5*  HCT 29.8* 25.9*  MCV 102.1* 103.6*  PLT 228 208   Urine Drug Screen: Drugs of Abuse     Component Value Date/Time   LABOPIA NONE DETECTED 08/02/2013 0926   COCAINSCRNUR NONE DETECTED 08/02/2013 0926   LABBENZ NONE DETECTED 08/02/2013 0926   AMPHETMU NONE DETECTED 08/02/2013 0926   THCU NONE DETECTED 08/02/2013 0926   LABBARB NONE DETECTED 08/02/2013 0926    Urinalysis:  Recent Labs Lab 09/23/14 1730  COLORURINE YELLOW  LABSPEC 1.008  PHURINE 5.5  GLUCOSEU NEGATIVE  HGBUR NEGATIVE  BILIRUBINUR NEGATIVE  KETONESUR NEGATIVE  PROTEINUR NEGATIVE  UROBILINOGEN 0.2  NITRITE NEGATIVE  LEUKOCYTESUR NEGATIVE    Micro Results: Recent Results (from the past 240 hour(s))  Blood culture (routine x 2)     Status: None (Preliminary result)   Collection Time: 09/23/14  2:00 PM  Result Value Ref Range Status   Specimen Description BLOOD LEFT ANTECUBITAL  Final   Special Requests BOTTLES DRAWN AEROBIC AND ANAEROBIC 5CC  Final   Culture PENDING  Incomplete   Report Status PENDING  Incomplete  MRSA PCR Screening     Status: None   Collection Time: 09/23/14  5:59 PM  Result Value Ref Range Status   MRSA by PCR NEGATIVE NEGATIVE Final    Comment:        The GeneXpert MRSA Assay (FDA approved for NASAL specimens only), is one component of a comprehensive MRSA colonization surveillance program. It is not intended to diagnose MRSA infection nor to guide or monitor treatment for MRSA infections.    Studies/Results: Dg Chest Port 1 [address]  09/23/2014   CLINICAL DATA:  Central line placement.  Shortness of breath  EXAM: PORTABLE CHEST - 1 VIEW  COMPARISON:  09/23/2014  FINDINGS: There is a right IJ catheter with tip in the cavoatrial junction. The heart size appears normal. There is no pleural effusion or edema. No airspace consolidation.  IMPRESSION: 1. No acute cardiopulmonary abnormalities.   Electronically Signed   By: Signa Kellaylor  Stroud M.D.   On: 09/23/2014 21:07   Dg Chest Port 1 View  09/23/2014   CLINICAL DATA:  Shortness of breath. Hypoxia. Congestive heart failure. Coronary artery disease.  EXAM: PORTABLE CHEST - 1 VIEW  COMPARISON:  08/14/2014  FINDINGS: Low lung volumes are noted.  Mild cardiomegaly stable. Both lungs are clear. No evidence of pneumothorax or pleural effusion.  IMPRESSION: Mild cardiomegaly and low lung volumes.  No acute findings.   Electronically Signed   By: Myles RosenthalJohn  Stahl M.D.   On: 09/23/2014 15:43   Medications: I have reviewed the patient's current medications. Scheduled Meds: . aspirin EC  81 mg Oral Daily  . enoxaparin (LOVENOX) injection  30 mg Subcutaneous Q24H  . simvastatin  20 mg Oral q1800  . sodium chloride  3 mL Intravenous Q12H  . vitamin B-12  1,000 mcg Oral Daily   Continuous Infusions: . sodium chloride 125 mL/hr at 09/23/14 2126   PRN Meds:.acetaminophen   Assessment/Plan: Ms. Lajean Silviusorma J Lanyon is a 81 y.o. female w/ PMHx of chronic sCHF (EF 25-30%), CAD, HTN, and HLD, admitted for hypotension.   Hypotension: Improved quite a bit overnight w/ IVF, systolic still in the 90's this AM however. Asymptomatic.  -Check orthostatics.  -NS 500 cc bolus, then 100 cc/hr  -Continue to HOLD BP medications -Move out of stepdown  Left Great Toe Wound: Has been a chronic issue, related to vascular disease. Do not feel this is contributing to her clinical picture at this time.  -Wound care consult -Will discuss w/ VVS today  PVD: Patient recently went for left leg arteriogram which was significant for superficial femoral artery occlusion. She is currently scheduled for left fem-pop bypass on Monday w/ Dr. Arbie CookeyEarly. He is aware patient has been admitted.  -VVS as above  HypoK/Hypomag: K 3.2 this AM, Mag 1.7.  -Repleted.  -Recheck in AM  HTN: As above -Hold home medications  HLD: Stable.  -Continue Zocor  CAD: Significant LCx disease. Follows w/ Dr. SwazilandJordan. Denies chest pain. -Continue ASA + Zocor  DVT/PE PPx: Lovenox  Dispo: Disposition is deferred at this time, awaiting improvement of current medical problems.  Anticipated discharge in approximately 1-2 day(s).   The patient does have a current PCP (Peter M SwazilandJordan, MD) and does  [facility] after discharge.  The patient does not have transportation limitations that hinder transportation to clinic appointments.  .Services Needed at time of discharge: Y = Yes, Blank = No PT:   OT:   RN:   Equipment:   Other:     LOS: 1 day   Courtney ParisEden W Kailei Cowens, MD 09/24/2014, 9:16 AM

## 2014-09-24 NOTE — Progress Notes (Signed)
Subjective: No acute events overnight. Doing well this morning. No dyspnea or pain. Complains of central line discomfort.   Interval events: R IJ CVC placed by Dr. Yetta Barre last night.   Objective: Vital signs in last 24 hours: Filed Vitals:   09/24/14 0200 09/24/14 0300 09/24/14 0402 09/24/14 0724  BP: 115/50 108/46 121/61 98/45  Pulse:      Temp:   98.7 F (37.1 C) 98.9 F (37.2 C)  TempSrc:   Oral Oral  Resp: Height:      Weight:      SpO2:  96% 94% 95%   Weight change:   Intake/Output Summary (Last 24 hours) at 09/24/14 0948 Last data filed at 09/24/14 0900  Gross per 24 hour  Intake 1745.83 ml  Output    450 ml  Net 1295.83 ml    Physical Exam: General: Alert, cooperative, NAD. HEENT: EOMI. Moist mucus membranes Neck: Full range of motion without pain Lungs: Clear to ascultation bilaterally, normal work of respiration, no wheezes, rales, rhonchi Heart: RRR, no murmurs, gallops, or rubs Abdomen: Soft, non-tender, non-distended, BS + Extremities: No cyanosis, clubbing, or edema. Necrosis of left great toe. Unable to palpate dorsalis pedis pulses b/l but extremities are warm.  Neurologic: Alert & oriented X3, cranial nerves II-XII grossly intact, strength grossly intact, sensation intact to light touch  Lab Results:  CBC:  Recent Labs Lab 09/21/14 1047 09/23/14 1410 09/24/14 0445  WBC  --  10.7* 7.7  HGB 11.9* 9.8* 8.5*  HCT 35.0* 29.8* 25.9*  MCV  --  102.1* 103.6*  PLT  --  228 208    Basic Metabolic Panel:  Recent Labs Lab 09/21/14 1047 09/23/14 1410 09/24/14 0445  NA 140 139 139  K 4.3 3.9 3.2*  CL 106 106 107  CO2  --  22 24  GLUCOSE 78 108* 88  BUN 28* 17 17  CREATININE 1.70* 1.59* 1.35*  CALCIUM  --  8.8* 8.1*  MG  --   --  1.7    Liver Function Tests:  Recent Labs Lab 09/23/14 1410  AST 14*  ALT 10*  ALKPHOS 46  BILITOT 0.4  PROT 6.4*  ALBUMIN 2.6*   Micro Results: Recent Results (from the past 240 hour(s))    Blood culture (routine x 2)     Status: None (Preliminary result)   Collection Time: 09/23/14  2:00 PM  Result Value Ref Range Status   Specimen Description BLOOD LEFT ANTECUBITAL  Final   Special Requests BOTTLES DRAWN AEROBIC AND ANAEROBIC 5CC  Final   Culture PENDING  Incomplete   Report Status PENDING  Incomplete  MRSA PCR Screening     Status: None   Collection Time: 09/23/14  5:59 PM  Result Value Ref Range Status   MRSA by PCR NEGATIVE NEGATIVE Final    Comment:        The GeneXpert MRSA Assay (FDA approved for NASAL specimens only), is one component of a comprehensive MRSA colonization surveillance program. It is not intended to diagnose MRSA infection nor to guide or monitor treatment for MRSA infections.     Medications: I have reviewed the patient's current medications. Scheduled Meds: . aspirin EC  81 mg Oral Daily  . enoxaparin (LOVENOX) injection  30 mg Subcutaneous Q24H  . magnesium sulfate 1 - 4 g bolus IVPB  1 g Intravenous Once  . simvastatin  20 mg Oral q1800  . sodium chloride  500 mL Intravenous Once  .  sodium chloride  3 mL Intravenous Q12H  . vitamin B-12  1,000 mcg Oral Daily   Continuous Infusions: . sodium chloride     PRN Meds:.acetaminophen  Assessment/Plan: Karina Willis is a 81 y.o. yo female with a PMHx of CAD, systolic CHF, HTN, HLD and PAD with left food ischemia who was admitted on 09/23/2014 with hypotension, which was determined to be most likely secondary to overmedication for HTN. Volume depletion may also contribute.  Hypotension- BP improved this AM but systolics still in 90s. Explains positional lightheadedness and presyncopal sx. Most likely caused by continued use of BP medications despite lower pressures documented last admission.  - cont to hold home BP meds - lower IV NS to 12700ml/hr - give 500cc NS bolus x1  - Cardiac monitoring - Encourage PO intake - Orthostatic vitals - consult vascular surgery  Gangrenous left  great toe 2/2 PAD - surgery on Monday - Tylenol 500mg  po prn q4h - Consult vascular surgery re desired mgmt and dispo - Wound care consulted  Hypomagnesemia - 1.7 this AM - repleted with Mg sulfate 1g IV x1  Hypokalemia - 3.2 this AM - repleted with K-dur 40mEq   HLD - stable, outpatient Continue home simvastatin 20mg  po daily.  CAD - stable, outpatient.  Follows with Dr. Peter SwazilandJordan (cardiology). Continue aspirin 81mg  daily.  Macrocytic anemia - chronic, stable B12 deficiency dx last admission, to [facility] - continue Vit B12 1,05300mcg tablet po daily  FEN - lower IV NS to 13100ml/hr - Replete electrolytes prn - Regular diet  Access: none, need line placed  DVT ppx: lovenox  Code status: FULL  Disposition: Continue obs status and cardiac monitoring. Consider discharge today if BP improves and vascular agrees.   This is a Psychologist, occupationalMedical Student Note.  The care of the patient was discussed with Dr. Yetta BarreJones and the assessment and plan formulated with their assistance.  Please see their attached note for official documentation of the daily encounter.   LOS: 1 day   Ervin KnackLeah M Jamelle Noy, Med Student 09/24/2014, 9:48 AM

## 2014-09-24 NOTE — Consult Note (Signed)
WOC wound consult note Reason for Consult: LGT eroded nail, exposed nail bed.  Moisture associated skin damage (maceration) noted in the interdigital spaces Wound type:Peripherial arterial disease Pressure Ulcer POA: No Wound bed: ruddy red nail bed, no drainage (full thickness).  Moisture associated skin damage in the intertriginous spaces between toes (partial thickness). Scant serous exudate. Drainage (amount, consistency, odor) See above Periwound:intact, dry Dressing procedure/placement/frequency:I will suggest we pain the exposed nail bed with a betadine swabstick and all ow to air dry and will provide nursing with guidance for care of the intertriginous areas between the toes with an antimicrobial (silver) hydrofiber).  Additionally, due to the known PAD, I will provide a pressure redistribution heel boot for heel pressure injury prevention.  Noted is the fact that patient is scheduled for a reperfusion procedure on Monday, 09/26/14. WOC nursing team will not follow, but will remain available to this patient, the nursing and medical team.  Please re-consult if needed. Thanks, Ladona MowLaurie Nirvan Laban, MSN, RN, GNP, [place], CWON-AP [phone])

## 2014-09-24 NOTE — H&P (Signed)
Date: 09/24/2014               Patient Name:  Karina Willis MRN: [id number]  DOB: 11/09/1932 Age / Sex: 81 y.o., female   PCP: Peter M SwazilandJordan, MD              Medical Service: Internal Medicine Teaching Service              Attending Physician: Dr. Aletta EdouardShilpa Bhardwaj, MD    First Contact: Payton EmeraldMadeline Steed Kanaan, MS4 Pager: [phone]  Second Contact: Dr. Yetta BarreJones Pager: [phone]       After Hours (After 5p/  First Contact Pager: [phone]  weekends / holidays): Second Contact Pager: [phone]   Chief Complaint: low blood pressure  History of Present Illness: Karina Willis is an 81yo female with PMH of CAD, systolic CHF, HTN, HLD and PAD with left food ischemia who presents from pre-op clinic with lightheadedness and systolic BPs in 70s. She was briefly admitted to [facility] from June 6-7 where she was found to have gangrene of her left great toe and low blood pressure while on multiple medications for HTN. Medications included amlodipine, ramipril, Lasix, metoprolol and spironolactone. She was discharged to a SNF with instructions to stop these BP medications and [facility] and no follow-up occurred. The SNF continued to give her BP medications and her niece, her primary caregiver, reports being unaware of these changes when patient was discharged from the facility. Patient has since been taking medications as instructed on the bottles. She now endorses severe lightheadedness when trying to stand requiring her to steady herself on her walker. No h/o syncope or recent falls but does report times when she felt like she was about to pass out. She also reports enuresis, which she attributes to not being able to "get up fast enough" due to the pain from her ischemic toe but not due to orthostatic dizziness. Denies dysuria, abdominal pain, dyspnea and headaches.    Today, she was seen by pre-op in  preparation for a femoral to above-knee popliteal bypass with vascular surgery on Monday for limb salvage of her left toe.   Meds: Current Facility-Administered Medications  Medication Dose Route Frequency Provider Last Rate Last Dose  . 0.9 %  sodium chloride infusion   Intravenous Continuous Courtney ParisEden W Jones, MD 125 mL/hr at 09/23/14 2126    . acetaminophen (TYLENOL) tablet 500 mg  500 mg Oral Q4H PRN Jana HalfNicholas A Taylor, MD   500 mg at 09/24/14 0437  . aspirin EC tablet 81 mg  81 mg Oral Daily Courtney ParisEden W Jones, MD      . enoxaparin (LOVENOX) injection 30 mg  30 mg Subcutaneous Q24H Courtney ParisEden W Jones, MD   30 mg at 09/23/14 1911  . simvastatin (ZOCOR) tablet 20 mg  20 mg Oral q1800 Courtney ParisEden W Jones, MD   20 mg at 09/23/14 1911  . sodium chloride 0.9 % injection 3 mL  3 mL Intravenous Q12H Courtney ParisEden W Jones, MD   10 mL at 09/23/14 2127    Allergies: Allergies as of 09/23/2014 - Review Complete 09/23/2014  Allergen Reaction Noted  . Penicillins Hives and Itching 07/30/2010   Past Medical History  Diagnosis Date  . CHF (congestive heart failure)   . Systolic dysfunction     CHRONIC, EF 25-30%  . Mitral insufficiency     SEVERE  . Coronary artery disease 2001  WITH DOCUMENTED TOTAL OCCLUSION OF THE RIGHT CORONARY AND THE LEFT CIRCUMFLEX CORONARY  . SOB (shortness of breath)   . Hypertension   . Hyperlipidemia   . History of TIAs   . Arthritis   . Non compliance w medication regimen   . Neck pain    Past Surgical History  Procedure Laterality Date  . Cardiac catheterization  06/05/1999    ENLARGED LEFT VENTRICULAR SIZE. THERE IS AKINESIA OF THE INFERIOR BASE. LEFT VENTRICULAR  FUNCTIONS APPEAR TO BE MODERATELY REDUCED WITH EF 35-40%  . Appendectomy    . Left arm surgery    . Peripheral vascular catheterization Left 09/21/2014    Procedure: Lower Extremity Angiography;  Surgeon: Larina Earthly, MD;  Location: [facility] INVASIVE CV LAB;  Service: Cardiovascular;  Laterality: Left;   Family History  Problem  Relation Age of Onset  . Heart attack Mother   . Heart attack Father   . Heart disease Sister   . Heart attack Brother   . Heart attack Brother   . Heart attack Brother   . Heart disease Sister    History   Social History  . Marital Status: Divorced    Spouse Name: N/A  . Number of Children: 0  . Years of Education: N/A   Occupational History  . waitress     retired   Social History Main Topics  . Smoking status: Former Smoker    Types: Cigarettes    Quit date: 08/21/1983  . Smokeless tobacco: Never Used  . Alcohol Use: No  . Drug Use: No  . Sexual Activity: Not on file   Other Topics Concern  . Not on file   Social History Narrative    Review of Systems: Pertinent items are noted in HPI.  Physical Exam: General: Alert, cooperative, NAD. HEENT: EOMI. Moist mucus membranes Neck: Full range of motion without pain Lungs: Clear to ascultation bilaterally, normal work of respiration, no wheezes, rales, rhonchi Heart: RRR, distant heart sounds, no murmurs, gallops, or rubs Abdomen: Soft, non-tender, non-distended, BS + Extremities: No cyanosis, clubbing or edema. Left big toe bandaged.  Neurologic: Alert & oriented X3, cranial nerves grossly II-XII intact, strength grossly intact, sensation intact to light touch  Lab Results:  CBC:  Recent Labs Lab 09/21/14 1047 09/23/14 1410 09/24/14 0445  WBC  --  10.7* 7.7  HGB 11.9* 9.8* 8.5*  HCT 35.0* 29.8* 25.9*  MCV  --  102.1* 103.6*  PLT  --  228 208    Basic Metabolic Panel:  Recent Labs Lab 09/21/14 1047 09/23/14 1410 09/24/14 0445  NA 140 139 139  K 4.3 3.9 3.2*  CL 106 106 107  CO2  --  22 24  GLUCOSE 78 108* 88  BUN 28* 17 17  CREATININE 1.70* 1.59* 1.35*  CALCIUM  --  8.8* 8.1*    Liver Function Tests:  Recent Labs Lab 09/23/14 1410  AST 14*  ALT 10*  ALKPHOS 46  BILITOT 0.4  PROT 6.4*  ALBUMIN 2.6*    Micro Results: Recent Results (from the past 240 hour(s))  Blood culture  (routine x 2)     Status: None (Preliminary result)   Collection Time: 09/23/14  2:00 PM  Result Value Ref Range Status   Specimen Description BLOOD LEFT ANTECUBITAL  Final   Special Requests BOTTLES DRAWN AEROBIC AND ANAEROBIC 5CC  Final   Culture PENDING  Incomplete   Report Status PENDING  Incomplete  MRSA PCR Screening  Status: None   Collection Time: 09/23/14  5:59 PM  Result Value Ref Range Status   MRSA by PCR NEGATIVE NEGATIVE Final    Comment:        The GeneXpert MRSA Assay (FDA approved for NASAL specimens only), is one component of a comprehensive MRSA colonization surveillance program. It is not intended to diagnose MRSA infection nor to guide or monitor treatment for MRSA infections.     Studies/Results: Dg Chest Port 1 View  09/23/2014   CLINICAL DATA:  Central line placement.  Shortness of breath  EXAM: PORTABLE CHEST - 1 VIEW  COMPARISON:  09/23/2014  FINDINGS: There is a right IJ catheter with tip in the cavoatrial junction. The heart size appears normal. There is no pleural effusion or edema. No airspace consolidation.  IMPRESSION: 1. No acute cardiopulmonary abnormalities.   Electronically Signed   By: Signa Kell M.D.   On: 09/23/2014 21:07   Dg Chest Port 1 View  09/23/2014   CLINICAL DATA:  Shortness of breath. Hypoxia. Congestive heart failure. Coronary artery disease.  EXAM: PORTABLE CHEST - 1 VIEW  COMPARISON:  08/14/2014  FINDINGS: Low lung volumes are noted. Mild cardiomegaly stable. Both lungs are clear. No evidence of pneumothorax or pleural effusion.  IMPRESSION: Mild cardiomegaly and low lung volumes.  No acute findings.   Electronically Signed   By: Myles Rosenthal M.D.   On: 09/23/2014 15:43    Other results: EKG: normal EKG, normal sinus rhythm.  Assessment/Plan: Karina Willis is a 81 y.o. yo female with a PMHx of CAD, systolic CHF, HTN, HLD and PAD with left food ischemia who was admitted on 09/23/2014 with hypotension, which was determined to  be most likely secondary to overmedication for HTN. Volume depletion may also contribute.  Hypotension   Explains positional lightheadedness and presyncopal sx. Most likely caused by continued use of BP medications despite lower pressures documented last admission. Dehydration may also contribute since her BP responded to 1 bolus of NS and she appears dry. Unfortunately, there have been multiple failed attempts to obtain access. Decompensated CHF is unlikely since echo from 08/15/14 showed a preserved EF 55-60% with grade 1 diastolic dysfunction. UA unremarkable. Pt is also afebrile so infection unlikely cause of hypotension. - HOLD home BP meds - amlodipine, ramipril, Lasix, metoprolol and spironolactone - Will reassess BP tomorrow morning - PICC line or PIV placement by IV team required - NS IV @ 180ml/hr once access obtained - Cardiac monitoring - Encourage PO intake - Orthostatic vitals - BMP, CBC in AM  Gangrenous left great toe 2/2 PAD - surgery on Monday Manage pain with tylenol and tramadol for pain until line placed. Avoid opioids until hypotension resolved.  HLD - stable, outpatient Continue home simvastatin  po daily.  CAD - stable, outpatient.  Follows with Dr. Peter Swaziland (cardiology). Continue aspirin  daily.  FEN - MIVF NS - Replete electrolytes prn - Regular diet  Access: none, need line placed  DVT ppx: lovenox  Code status: FULL  Disposition: Admit to observation with cardiac monitoring.   This is a Psychologist, occupational Note.  The care of the patient was discussed with Dr. Yetta Barre and the assessment and plan was formulated with their assistance.  Please see their note for official documentation of the patient encounter.   Signed: Ervin Knack, Med Student 09/24/2014, 6:45 AM

## 2014-09-25 DIAGNOSIS — R0602 Shortness of breath: Secondary | ICD-10-CM | POA: Diagnosis not present

## 2014-09-25 DIAGNOSIS — M150 Primary generalized (osteo)arthritis: Secondary | ICD-10-CM | POA: Diagnosis not present

## 2014-09-25 DIAGNOSIS — M1990 Unspecified osteoarthritis, unspecified site: Secondary | ICD-10-CM | POA: Diagnosis not present

## 2014-09-25 DIAGNOSIS — I5022 Chronic systolic (congestive) heart failure: Secondary | ICD-10-CM | POA: Diagnosis not present

## 2014-09-25 DIAGNOSIS — I959 Hypotension, unspecified: Secondary | ICD-10-CM | POA: Diagnosis not present

## 2014-09-25 DIAGNOSIS — Z8249 Family history of ischemic heart disease and other diseases of the circulatory system: Secondary | ICD-10-CM | POA: Diagnosis not present

## 2014-09-25 DIAGNOSIS — L03119 Cellulitis of unspecified part of limb: Secondary | ICD-10-CM | POA: Diagnosis not present

## 2014-09-25 DIAGNOSIS — Z9114 Patient's other noncompliance with medication regimen: Secondary | ICD-10-CM | POA: Diagnosis present

## 2014-09-25 DIAGNOSIS — E440 Moderate protein-calorie malnutrition: Secondary | ICD-10-CM | POA: Diagnosis not present

## 2014-09-25 DIAGNOSIS — D519 Vitamin B12 deficiency anemia, unspecified: Secondary | ICD-10-CM | POA: Diagnosis not present

## 2014-09-25 DIAGNOSIS — M810 Age-related osteoporosis without current pathological fracture: Secondary | ICD-10-CM | POA: Diagnosis not present

## 2014-09-25 DIAGNOSIS — Z9889 Other specified postprocedural states: Secondary | ICD-10-CM | POA: Diagnosis not present

## 2014-09-25 DIAGNOSIS — I472 Ventricular tachycardia: Secondary | ICD-10-CM | POA: Diagnosis not present

## 2014-09-25 DIAGNOSIS — I2582 Chronic total occlusion of coronary artery: Secondary | ICD-10-CM | POA: Diagnosis present

## 2014-09-25 DIAGNOSIS — Z79899 Other long term (current) drug therapy: Secondary | ICD-10-CM | POA: Diagnosis not present

## 2014-09-25 DIAGNOSIS — Z880 Allergy status to penicillin: Secondary | ICD-10-CM | POA: Diagnosis not present

## 2014-09-25 DIAGNOSIS — I509 Heart failure, unspecified: Secondary | ICD-10-CM | POA: Diagnosis not present

## 2014-09-25 DIAGNOSIS — I70262 Atherosclerosis of native arteries of extremities with gangrene, left leg: Secondary | ICD-10-CM | POA: Diagnosis not present

## 2014-09-25 DIAGNOSIS — Z01810 Encounter for preprocedural cardiovascular examination: Secondary | ICD-10-CM | POA: Diagnosis not present

## 2014-09-25 DIAGNOSIS — Z01812 Encounter for preprocedural laboratory examination: Secondary | ICD-10-CM | POA: Diagnosis not present

## 2014-09-25 DIAGNOSIS — L97409 Non-pressure chronic ulcer of unspecified heel and midfoot with unspecified severity: Secondary | ICD-10-CM | POA: Diagnosis not present

## 2014-09-25 DIAGNOSIS — D539 Nutritional anemia, unspecified: Secondary | ICD-10-CM | POA: Diagnosis not present

## 2014-09-25 DIAGNOSIS — L97529 Non-pressure chronic ulcer of other part of left foot with unspecified severity: Secondary | ICD-10-CM | POA: Diagnosis present

## 2014-09-25 DIAGNOSIS — I2510 Atherosclerotic heart disease of native coronary artery without angina pectoris: Secondary | ICD-10-CM | POA: Diagnosis not present

## 2014-09-25 DIAGNOSIS — Z01811 Encounter for preprocedural respiratory examination: Secondary | ICD-10-CM | POA: Diagnosis not present

## 2014-09-25 DIAGNOSIS — K5900 Constipation, unspecified: Secondary | ICD-10-CM | POA: Diagnosis present

## 2014-09-25 DIAGNOSIS — I739 Peripheral vascular disease, unspecified: Secondary | ICD-10-CM | POA: Diagnosis not present

## 2014-09-25 DIAGNOSIS — Z79891 Long term (current) use of opiate analgesic: Secondary | ICD-10-CM | POA: Diagnosis not present

## 2014-09-25 DIAGNOSIS — X58XXXD Exposure to other specified factors, subsequent encounter: Secondary | ICD-10-CM | POA: Diagnosis not present

## 2014-09-25 DIAGNOSIS — N183 Chronic kidney disease, stage 3 (moderate): Secondary | ICD-10-CM | POA: Diagnosis present

## 2014-09-25 DIAGNOSIS — Z8673 Personal history of transient ischemic attack (TIA), and cerebral infarction without residual deficits: Secondary | ICD-10-CM | POA: Diagnosis not present

## 2014-09-25 DIAGNOSIS — E780 Pure hypercholesterolemia: Secondary | ICD-10-CM | POA: Diagnosis not present

## 2014-09-25 DIAGNOSIS — I129 Hypertensive chronic kidney disease with stage 1 through stage 4 chronic kidney disease, or unspecified chronic kidney disease: Secondary | ICD-10-CM | POA: Diagnosis present

## 2014-09-25 DIAGNOSIS — E785 Hyperlipidemia, unspecified: Secondary | ICD-10-CM | POA: Diagnosis present

## 2014-09-25 DIAGNOSIS — I340 Nonrheumatic mitral (valve) insufficiency: Secondary | ICD-10-CM | POA: Diagnosis present

## 2014-09-25 DIAGNOSIS — E876 Hypokalemia: Secondary | ICD-10-CM | POA: Diagnosis not present

## 2014-09-25 DIAGNOSIS — Z87891 Personal history of nicotine dependence: Secondary | ICD-10-CM | POA: Diagnosis not present

## 2014-09-25 DIAGNOSIS — Z7982 Long term (current) use of aspirin: Secondary | ICD-10-CM | POA: Diagnosis not present

## 2014-09-25 DIAGNOSIS — I10 Essential (primary) hypertension: Secondary | ICD-10-CM | POA: Diagnosis not present

## 2014-09-25 DIAGNOSIS — S91102D Unspecified open wound of left great toe without damage to nail, subsequent encounter: Secondary | ICD-10-CM | POA: Diagnosis not present

## 2014-09-25 DIAGNOSIS — M6281 Muscle weakness (generalized): Secondary | ICD-10-CM | POA: Diagnosis not present

## 2014-09-25 LAB — BASIC METABOLIC PANEL
Anion gap: 6 (ref 5–15)
BUN: 10 mg/dL (ref 6–20)
CALCIUM: 8 mg/dL — AB (ref 8.9–10.3)
CO2: 23 mmol/L (ref 22–32)
Chloride: 109 mmol/L (ref 101–111)
Creatinine, Ser: 0.97 mg/dL (ref 0.44–1.00)
GFR calc Af Amer: >60 mL/min (ref >60)
GFR, EST NON AFRICAN AMERICAN: 54 mL/min — AB (ref >60)
Glucose, Bld: 86 mg/dL (ref 65–99)
Potassium: 3.8 mmol/L (ref 3.5–5.1)
SODIUM: 138 mmol/L (ref 135–145)

## 2014-09-25 LAB — CBC
HCT: 24 % — ABNORMAL LOW (ref 36.0–46.0)
Hemoglobin: 7.7 g/dL — ABNORMAL LOW (ref 12.0–15.0)
MCH: 32.6 pg (ref 26.0–34.0)
MCHC: 32.1 g/dL (ref 30.0–36.0)
MCV: 101.7 fL — ABNORMAL HIGH (ref 78.0–100.0)
PLATELETS: 212 10*3/uL (ref 150–400)
RBC: 2.36 MIL/uL — AB (ref 3.87–5.11)
RDW: 15 % (ref 11.5–15.5)
WBC: 6.7 10*3/uL (ref 4.0–10.5)

## 2014-09-25 LAB — PREPARE RBC (CROSSMATCH)

## 2014-09-25 LAB — MAGNESIUM: Magnesium: 1.9 mg/dL (ref 1.7–2.4)

## 2014-09-25 MED ORDER — VANCOMYCIN HCL IN DEXTROSE 1-5 GM/200ML-% IV SOLN
1000.0000 mg | INTRAVENOUS | Status: AC
  Administered 2014-09-26: 1000 mg via INTRAVENOUS
  Filled 2014-09-25: qty 200

## 2014-09-25 MED ORDER — CHLORHEXIDINE GLUCONATE CLOTH 2 % EX PADS
6.0000 | MEDICATED_PAD | Freq: Once | CUTANEOUS | Status: AC
  Administered 2014-09-25: 6 via TOPICAL

## 2014-09-25 MED ORDER — OXYCODONE HCL 5 MG PO TABS
5.0000 mg | ORAL_TABLET | ORAL | Status: DC | PRN
  Administered 2014-09-25 – 2014-09-26 (×6): 5 mg via ORAL
  Filled 2014-09-25 (×6): qty 1

## 2014-09-25 MED ORDER — SODIUM CHLORIDE 0.9 % IV SOLN
Freq: Once | INTRAVENOUS | Status: AC
  Administered 2014-09-25: 11:00:00 via INTRAVENOUS

## 2014-09-25 MED ORDER — FOLIC ACID 1 MG PO TABS
1.0000 mg | ORAL_TABLET | Freq: Every day | ORAL | Status: DC
  Administered 2014-09-25 – 2014-09-28 (×3): 1 mg via ORAL
  Filled 2014-09-25 (×4): qty 1

## 2014-09-25 MED ORDER — SODIUM CHLORIDE 0.9 % IV SOLN
INTRAVENOUS | Status: DC
  Administered 2014-09-25: 1000 mL via INTRAVENOUS
  Administered 2014-09-26: 10:00:00 via INTRAVENOUS

## 2014-09-25 NOTE — Evaluation (Signed)
Physical Therapy Evaluation Patient Details Name: Karina Willis MRN: [id number] DOB: 11/08/1932 Today's Date: 09/25/2014   History of Present Illness  81 y.o. female with CAD, systolic CHF, severe MR, HTN, and HLD. She went to her MD 09-23-14 for preop workup and was found to be hypotension. She was therefore admitted. She is schedule for fem-pop 09-26-14 due to severe PVD and L great toe ulcer.  Clinical Impression  Pt admitted with above diagnosis. Pt currently with functional limitations due to the deficits listed below (see PT Problem List). On eval, pt required min assist for bed mobility and min guard assist for transfers. Ambulation was limited by pain in L foot.  Pt will benefit from skilled PT to increase their independence and safety with mobility to allow discharge to the venue listed below.       Follow Up Recommendations SNF;Supervision/Assistance - 24 hour    Equipment Recommendations  None recommended by PT    Recommendations for Other Services       Precautions / Restrictions Precautions Precautions: Fall      Mobility  Bed Mobility Overal bed mobility: Needs Assistance Bed Mobility: Supine to Sit     Supine to sit: Min assist     General bed mobility comments: assist to scoot to EOB  Transfers Overall transfer level: Needs assistance Equipment used: Rolling walker (2 wheeled) Transfers: Sit to/from [employer] CorporationStand;Stand Pivot Transfers Sit to Stand: Min guard Stand pivot transfers: Min guard       General transfer comment: increased time required to complete, verbal cues for sequencing  Ambulation/Gait             General Gait Details: Unable to ambulate past bed to chair due to pain with WB LLE.  Stairs            Wheelchair Mobility    Modified Rankin (Stroke Patients Only)       Balance                                             Pertinent Vitals/Pain Pain Assessment: 0-10 Pain Score: 8  Pain Location: left  foot Pain Descriptors / Indicators: Aching Pain Intervention(s): Limited activity within patient's tolerance;Repositioned;Patient requesting pain meds-RN notified    Home Living Family/patient expects to be discharged to:: Skilled nursing facility Living Arrangements: Other relatives               Additional Comments: Pt has a nephew and niece that stay with her intermittently. She resides in a 2-story apartment: level entry with bed and bathroom upstairs. Currently, she is only staying on the 1st floor with a hospital bed and bedside commode.    Prior Function Level of Independence: Needs assistance   Gait / Transfers Assistance Needed: Uses RW for ambulation.  ADL's / Homemaking Assistance Needed: assist with bathing and also with dressing at times        Hand Dominance   Dominant Hand: Right    Extremity/Trunk Assessment                         Communication   Communication: No difficulties  Cognition Arousal/Alertness: Awake/alert Behavior During Therapy: WFL for tasks assessed/performed Overall Cognitive Status: Within Functional Limits for tasks assessed  General Comments      Exercises        Assessment/Plan    PT Assessment Patient needs continued PT services  PT Diagnosis Difficulty walking;Acute pain;Generalized weakness   PT Problem List Decreased strength;Decreased activity tolerance;Decreased balance;Decreased mobility;Pain;Decreased safety awareness  PT Treatment Interventions DME instruction;Gait training;Functional mobility training;Therapeutic activities;Therapeutic exercise;Patient/family education;Balance training   PT Goals (Current goals can be found in the Care Plan section) Acute Rehab PT Goals Patient Stated Goal: "stop my leg from hurting" PT Goal Formulation: With patient Time For Goal Achievement: 10/09/14 Potential to Achieve Goals: Good    Frequency Min 3X/week   Barriers to discharge         Co-evaluation               End of Session Equipment Utilized During Treatment: Gait belt Activity Tolerance: Patient limited by pain Patient left: in chair;with call bell/phone within reach Nurse Communication: Mobility status;Patient requests pain meds    Functional Assessment Tool Used: clinical judgement Functional Limitation: Mobility: Walking and moving around Mobility: Walking and Moving Around Current Status [phone]): At least 20 percent but less than 40 percent impaired, limited or restricted Mobility: Walking and Moving Around Goal Status [phone]): At least 1 percent but less than 20 percent impaired, limited or restricted    Time: 0852-0915 PT Time Calculation (min) (ACUTE ONLY): 23 min   Charges:   PT Evaluation $Initial PT Evaluation Tier I: 1 Procedure PT Treatments $Therapeutic Activity: 8-22 mins   PT G Codes:   PT G-Codes **NOT FOR INPATIENT CLASS** Functional Assessment Tool Used: clinical judgement Functional Limitation: Mobility: Walking and moving around Mobility: Walking and Moving Around Current Status (A2130): At least 20 percent but less than 40 percent impaired, limited or restricted Mobility: Walking and Moving Around Goal Status [phone]): At least 1 percent but less than 20 percent impaired, limited or restricted    Ilda Foil 09/25/2014, 9:27 AM

## 2014-09-25 NOTE — Progress Notes (Signed)
Subjective: Doing well this AM. No complaints today, BP stable, IVF stopped.   Objective: Vital signs in last 24 hours: Filed Vitals:   09/24/14 1140 09/24/14 1551 09/24/14 1900 09/25/14 0433  BP: 96/42 120/56 116/52 108/72  Pulse:  74 80   Temp: 98.1 F (36.7 C) 98.3 F (36.8 C) 98 F (36.7 C) 98.1 F (36.7 C)  TempSrc: Oral Oral Oral Oral  Resp: 18 16 16    Height:      Weight:      SpO2: 96% 99% 100% 100%   Weight change:   Intake/Output Summary (Last 24 hours) at 09/25/14 16100752 Last data filed at 09/24/14 2300  Gross per 24 hour  Intake 2156.67 ml  Output      0 ml  Net 2156.67 ml   Physical Exam: General: AA female, alert, cooperative, NAD. HEENT: PERRL, EOMI. Moist mucus membranes Neck: Full range of motion without pain, supple, no lymphadenopathy or carotid bruits Lungs: Clear to ascultation bilaterally, normal work of respiration, no wheezes, rales, rhonchi Heart: RRR, no murmurs, gallops, or rubs Abdomen: Soft, non-tender, non-distended, BS + Extremities: No cyanosis, clubbing, or edema. Left great toe w/ ulceration surrounding toenail. Uncut toenails. Obvious onychomycosis. Weak distal pulses bilaterally.  Neurologic: Alert & oriented x3, cranial nerves II-XII intact, strength grossly intact, sensation intact to light touch   Lab Results: Basic Metabolic Panel:  Recent Labs Lab 09/24/14 0445 09/25/14 0532  NA 139 138  K 3.2* 3.8  CL 107 109  CO2 24 23  GLUCOSE 88 86  BUN 17 10  CREATININE 1.35* 0.97  CALCIUM 8.1* 8.0*  MG 1.7 1.9   Liver Function Tests:  Recent Labs Lab 09/23/14 1410  AST 14*  ALT 10*  ALKPHOS 46  BILITOT 0.4  PROT 6.4*  ALBUMIN 2.6*   CBC:  Recent Labs Lab 09/24/14 0445 09/25/14 0532  WBC 7.7 6.7  HGB 8.5* 7.7*  HCT 25.9* 24.0*  MCV 103.6* 101.7*  PLT 208 212   Urine Drug Screen: Drugs of Abuse     Component Value Date/Time   LABOPIA NONE DETECTED 08/02/2013 0926   COCAINSCRNUR NONE DETECTED 08/02/2013  0926   LABBENZ NONE DETECTED 08/02/2013 0926   AMPHETMU NONE DETECTED 08/02/2013 0926   THCU NONE DETECTED 08/02/2013 0926   LABBARB NONE DETECTED 08/02/2013 0926    Urinalysis:  Recent Labs Lab 09/23/14 1730  COLORURINE YELLOW  LABSPEC 1.008  PHURINE 5.5  GLUCOSEU NEGATIVE  HGBUR NEGATIVE  BILIRUBINUR NEGATIVE  KETONESUR NEGATIVE  PROTEINUR NEGATIVE  UROBILINOGEN 0.2  NITRITE NEGATIVE  LEUKOCYTESUR NEGATIVE    Micro Results: Recent Results (from the past 240 hour(s))  Blood culture (routine x 2)     Status: None (Preliminary result)   Collection Time: 09/23/14  2:00 PM  Result Value Ref Range Status   Specimen Description BLOOD LEFT ANTECUBITAL  Final   Special Requests BOTTLES DRAWN AEROBIC AND ANAEROBIC 5CC  Final   Culture NO GROWTH < 24 HOURS  Final   Report Status PENDING  Incomplete  Blood culture (routine x 2)     Status: None (Preliminary result)   Collection Time: 09/23/14  2:15 PM  Result Value Ref Range Status   Specimen Description BLOOD RIGHT HAND  Final   Special Requests BOTTLES DRAWN AEROBIC AND ANAEROBIC 5CC  Final   Culture NO GROWTH < 24 HOURS  Final   Report Status PENDING  Incomplete  MRSA PCR Screening     Status: None   Collection Time:  09/23/14  5:59 PM  Result Value Ref Range Status   MRSA by PCR NEGATIVE NEGATIVE Final    Comment:        The GeneXpert MRSA Assay (FDA approved for NASAL specimens only), is one component of a comprehensive MRSA colonization surveillance program. It is not intended to diagnose MRSA infection nor to guide or monitor treatment for MRSA infections.    Studies/Results: Dg Chest Port 1 View  09/23/2014   CLINICAL DATA:  Central line placement.  Shortness of breath  EXAM: PORTABLE CHEST - 1 VIEW  COMPARISON:  09/23/2014  FINDINGS: There is a right IJ catheter with tip in the cavoatrial junction. The heart size appears normal. There is no pleural effusion or edema. No airspace consolidation.  IMPRESSION: 1.  No acute cardiopulmonary abnormalities.   Electronically Signed   By: Signa Kell M.D.   On: 09/23/2014 21:07   Dg Chest Port 1 View  09/23/2014   CLINICAL DATA:  Shortness of breath. Hypoxia. Congestive heart failure. Coronary artery disease.  EXAM: PORTABLE CHEST - 1 VIEW  COMPARISON:  08/14/2014  FINDINGS: Low lung volumes are noted. Mild cardiomegaly stable. Both lungs are clear. No evidence of pneumothorax or pleural effusion.  IMPRESSION: Mild cardiomegaly and low lung volumes.  No acute findings.   Electronically Signed   By: Myles Rosenthal M.D.   On: 09/23/2014 15:43   Medications: I have reviewed the patient's current medications. Scheduled Meds: . aspirin EC  81 mg Oral Daily  . enoxaparin (LOVENOX) injection  30 mg Subcutaneous Q24H  . folic acid  1 mg Oral Daily  . simvastatin  20 mg Oral q1800  . sodium chloride  10-40 mL Intracatheter Q12H  . sodium chloride  3 mL Intravenous Q12H  . vitamin B-12  1,000 mcg Oral Daily   Continuous Infusions:   PRN Meds:.acetaminophen, oxyCODONE, sodium chloride   Assessment/Plan: Ms. TANEKA ESPIRITU is a 81 y.o. female w/ PMHx of chronic sCHF (EF 25-30%), CAD, HTN, and HLD, admitted for hypotension.   Hypotension: Systolic in 120's today. IVF stopped. Asymptomatic.  -Continue to HOLD BP medications. If elevated, can restart Norvasc 2.5 mg daily.   Left Great Toe Wound: Has been a chronic issue, related to vascular disease. Do not feel this is contributing to her clinical picture at this time.  -Wound care consult appreciated -For fem-pop in AM  PVD: Patient recently went for left leg arteriogram which was significant for superficial femoral artery occlusion. She is currently scheduled for left fem-pop bypass on Monday w/ Dr. Arbie Cookey. He is aware patient has been admitted.  -VVS for surgery tomorrow -NPO at midnight  Macrocytic Anemia: Likely 2/2 B12 deficiency. Hb 7.5 today contributed to by hemodilution.  -Received 1 unit PRBC's per  VVS today  HypoK/Hypomag: Resolved.   HTN: As above -Hold home medications. Can possibly resume Norvasc 2.5 mg daily if needed and titrate as necessary.  HLD: Stable.  -Continue Zocor  CAD: Significant LCx disease. Follows w/ Dr. Swaziland. Denies chest pain. -Continue ASA + Zocor  DVT/PE PPx: Lovenox  Dispo:  Anticipated discharge 1-2 days s/p right fem-pop bypass.   The patient does have a current PCP (Peter M Swaziland, MD) and does need an [facility] hospital follow-up appointment after discharge.  The patient does not have transportation limitations that hinder transportation to clinic appointments.  .Services Needed at time of discharge: Y = Yes, Blank = No PT:   OT:   RN:   Equipment:  Other:     LOS: 2 days   Courtney Paris, MD 09/25/2014, 7:52 AM

## 2014-09-25 NOTE — Progress Notes (Signed)
   09/25/14 1400  Clinical Encounter Type  Visited With Patient  Visit Type Follow-up;Pre-op  Referral From Nurse  Consult/Referral To Chaplain  Spiritual Encounters  Spiritual Needs Prayer;Emotional  Stress Factors  Patient Stress Factors Other (Comment) (anxiety for upcoming surgery)  CH referred to come and pray with PT; CH had visited with PT on previous admit; PT listed as Jewish but is of the [place] faith (not Jewish) which needs to be updated in her chart; PT indicated her anxiousness for her upcoming surgery; CH did pray with PT; PT recalled previous visit with CH.  CH is appreciative that RN recognized PT need/request for prayer.

## 2014-09-25 NOTE — Progress Notes (Signed)
CCMD calls RN to inform her pt has 12beats run of V-tach, pt left toe dsg was being changed at time. Pt c/o pain but not yet time for prn oxycodone, says tylenol is ineffective for her pain, MD paged and notified. No new orders received yet. Arabella MerlesP. Amo Rylee Nuzum RN. Will closely monitor pt. Dionne BucyP. Amo Lisset Ketchem RN

## 2014-09-25 NOTE — Progress Notes (Signed)
VASCULAR SURGERY:  Notified that pt is in the hospital. Scheduled for left fempop bypass tomorrow by Dr. Early. Hgb is 7.7 Will need blood preop. Has a history of CHF and Stage 3 CKD.  Will give 1 unit slowly and defer further treatment to primary service Preop orders written.  Christopher Dickson, MD, FACS Beeper [phone] Office: [phone]  

## 2014-09-25 NOTE — Progress Notes (Signed)
Subjective: No acute events overnight. Was given tylenol prn for pain at 3:46am, but continued to have uncontrolled toe pain so oxycodone  was ordered and given at 4:36am. This prn remains available.   Objective: Vital signs in last 24 hours: Filed Vitals:   09/24/14 1140 09/24/14 1551 09/24/14 1900 09/25/14 0433  BP: 96/42 120/56 116/52 108/72  Pulse:  74 80   Temp: 98.1 F (36.7 C) 98.3 F (36.8 C) 98 F (36.7 C) 98.1 F (36.7 C)  TempSrc: Oral Oral Oral Oral  Resp: Height:      Weight:      SpO2: 96% 99% 100% 100%   Weight change:   Intake/Output Summary (Last 24 hours) at 09/25/14 0648 Last data filed at 09/24/14 2300  Gross per 24 hour  Intake 2156.67 ml  Output      0 ml  Net 2156.67 ml    Physical Exam: General: Alert, cooperative, NAD. HEENT: PERRL, EOMI. Moist mucus membranes Neck: Full range of motion without pain, supple, no lymphadenopathy or carotid bruits Lungs: Clear to ascultation bilaterally, normal work of respiration, no wheezes, rales, rhonchi Heart: RRR, no murmurs, gallops, or rubs Abdomen: Soft, non-tender, non-distended, BS + Extremities: No cyanosis, clubbing, or edema Neurologic: Alert & oriented X3, cranial nerves II-XII intact, strength grossly intact, sensation intact to light touch  Lab Results:  CBC:  Recent Labs Lab 09/21/14 1047 09/23/14 1410 09/24/14 0445 09/25/14 0532  WBC  --  10.7* 7.7 6.7  HGB 11.9* 9.8* 8.5* 7.7*  HCT 35.0* 29.8* 25.9* 24.0*  MCV  --  102.1* 103.6* 101.7*  PLT  --  228 208 212    Basic Metabolic Panel:  Recent Labs Lab 09/21/14 1047 09/23/14 1410 09/24/14 0445 09/25/14 0532  NA 140 139 139 138  K 4.3 3.9 3.2* 3.8  CL 106 106 107 109  CO2  --  GLUCOSE 78 108* 88 86  BUN 28* CREATININE 1.70* 1.59* 1.35* 0.97  CALCIUM  --  8.8* 8.1* 8.0*  MG  --   --  1.7 1.9    Micro Results: Recent Results (from the past 240 hour(s))  Blood culture (routine x 2)      Status: None (Preliminary result)   Collection Time: 09/23/14  2:00 PM  Result Value Ref Range Status   Specimen Description BLOOD LEFT ANTECUBITAL  Final   Special Requests BOTTLES DRAWN AEROBIC AND ANAEROBIC 5CC  Final   Culture NO GROWTH < 24 HOURS  Final   Report Status PENDING  Incomplete  Blood culture (routine x 2)     Status: None (Preliminary result)   Collection Time: 09/23/14  2:15 PM  Result Value Ref Range Status   Specimen Description BLOOD RIGHT HAND  Final   Special Requests BOTTLES DRAWN AEROBIC AND ANAEROBIC 5CC  Final   Culture NO GROWTH < 24 HOURS  Final   Report Status PENDING  Incomplete  MRSA PCR Screening     Status: None   Collection Time: 09/23/14  5:59 PM  Result Value Ref Range Status   MRSA by PCR NEGATIVE NEGATIVE Final    Comment:        The GeneXpert MRSA Assay (FDA approved for NASAL specimens only), is one component of a comprehensive MRSA colonization surveillance program. It is not intended to diagnose MRSA infection nor to guide or monitor treatment for MRSA infections.    Medications: I have reviewed the patient's  current medications. Scheduled Meds: . aspirin EC  81 mg Oral Daily  . enoxaparin (LOVENOX) injection  30 mg Subcutaneous Q24H  . simvastatin  20 mg Oral q1800  . sodium chloride  10-40 mL Intracatheter Q12H  . sodium chloride  3 mL Intravenous Q12H  . vitamin B-12  1,000 mcg Oral Daily   Continuous Infusions:  PRN Meds:.acetaminophen, oxyCODONE, sodium chloride  Assessment/Plan: Karina Willis is a 81 y.o. yo female with a PMHx of CAD, systolic CHF, HTN, HLD and PAD with left food ischemia who was admitted on 09/23/2014 with hypotension, which was determined to be most likely secondary to overmedication for HTN. Volume depletion may also contribute.  Hypotension- BP improved this AM, maintaining systolics in 100s Explains positional lightheadedness and presyncopal sx. Most likely caused by continued use of BP medications  despite lower pressures documented last admission.  - cont to hold home BP meds - d/c'd MIVF yesterday PM - Cardiac monitoring - Encourage PO intake  Gangrenous left great toe 2/2 PAD - surgery tomorrow (7/18), increased pain o/n - Tylenol 500mg  po prn q4h - Oxycodone IR 5mg  prn q4h - Vascular surgery - Hb 7.7, transfuse 1u RBC - NPO at MN - Wound care recs - betadine to nail bed, antimicrobial hydrofiber b/wn toes, pressure redistribution heel boot - PT eval - requires SNF placement following surgery, no equipment recommended  Macrocytic anemia - chronic, baseline Hb ~10 B12 deficiency dx last admission, to be managed by [facility]CP [facility]. Hb continued to trend down to and now 7.7 this AM, likely dilutional from fluids given this admission, however with surgery tomorrow will transfuse 1 unit RBC per vascular. - continue Vit B12 1,04900mcg tablet po daily - IV NS at 2110ml/hr x1 then transfuse 1u RBC  HLD - stable, outpatient Continue home simvastatin 20mg  po daily.  CAD - stable, outpatient.  Follows with Dr. Peter SwazilandJordan (cardiology). Continue aspirin 81mg  daily.  FEN - Saline lock - Replete electrolytes prn - Regular diet --> NPO at MN  Access: R IJ CVC  DVT ppx: lovenox  Code status: FULL  Disposition: Requires SNF placement. Disposition is deferred at this time, awaiting improvement of current medical problems. Anticipated discharge in approximately 1-2 day(s). [facility]   This is a Psychologist, occupationalMedical Student Note.  The care of the patient was discussed with Dr. Yetta BarreJones and the assessment and plan formulated with their assistance.  Please see their attached note for official documentation of the daily encounter.   LOS: 2 days   Ervin KnackLeah M Conya Ellinwood, Med Student 09/25/2014, 6:48 AM

## 2014-09-26 ENCOUNTER — Inpatient Hospital Stay (HOSPITAL_COMMUNITY): Payer: Medicare Other | Admitting: Vascular Surgery

## 2014-09-26 ENCOUNTER — Encounter (HOSPITAL_COMMUNITY)
Admission: EM | Disposition: A | Discharge status: Skilled Nursing Facility | Payer: Self-pay | Source: Home / Self Care | Attending: Internal Medicine

## 2014-09-26 ENCOUNTER — Inpatient Hospital Stay (HOSPITAL_COMMUNITY): Payer: Medicare Other | Admitting: Anesthesiology

## 2014-09-26 ENCOUNTER — Encounter (HOSPITAL_COMMUNITY): Payer: Self-pay | Admitting: Anesthesiology

## 2014-09-26 ENCOUNTER — Inpatient Hospital Stay (HOSPITAL_COMMUNITY): Admission: RE | Admit: 2014-09-26 | Payer: Medicare Other | Source: Ambulatory Visit | Admitting: Vascular Surgery

## 2014-09-26 DIAGNOSIS — D539 Nutritional anemia, unspecified: Secondary | ICD-10-CM

## 2014-09-26 DIAGNOSIS — I5022 Chronic systolic (congestive) heart failure: Secondary | ICD-10-CM

## 2014-09-26 DIAGNOSIS — Z9889 Other specified postprocedural states: Secondary | ICD-10-CM

## 2014-09-26 DIAGNOSIS — I70262 Atherosclerosis of native arteries of extremities with gangrene, left leg: Secondary | ICD-10-CM

## 2014-09-26 HISTORY — PX: FEMORAL-POPLITEAL BYPASS GRAFT: SHX937

## 2014-09-26 LAB — COMPREHENSIVE METABOLIC PANEL
ALT: 7 U/L — ABNORMAL LOW (ref 14–54)
ANION GAP: 4 — AB (ref 5–15)
AST: 13 U/L — AB (ref 15–41)
Albumin: 2 g/dL — ABNORMAL LOW (ref 3.5–5.0)
Alkaline Phosphatase: 39 U/L (ref 38–126)
BILIRUBIN TOTAL: 0.5 mg/dL (ref 0.3–1.2)
BUN: 7 mg/dL (ref 6–20)
CHLORIDE: 111 mmol/L (ref 101–111)
CO2: 23 mmol/L (ref 22–32)
Calcium: 8.3 mg/dL — ABNORMAL LOW (ref 8.9–10.3)
Creatinine, Ser: 0.89 mg/dL (ref 0.44–1.00)
GFR calc Af Amer: >60 mL/min (ref >60)
GFR calc non Af Amer: 60 mL/min — ABNORMAL LOW (ref >60)
Glucose, Bld: 87 mg/dL (ref 65–99)
Potassium: 4.2 mmol/L (ref 3.5–5.1)
Sodium: 138 mmol/L (ref 135–145)
TOTAL PROTEIN: 5.3 g/dL — AB (ref 6.5–8.1)

## 2014-09-26 LAB — CBC
HCT: 29.7 % — ABNORMAL LOW (ref 36.0–46.0)
Hemoglobin: 9.6 g/dL — ABNORMAL LOW (ref 12.0–15.0)
MCH: 32 pg (ref 26.0–34.0)
MCHC: 32.3 g/dL (ref 30.0–36.0)
MCV: 99 fL (ref 78.0–100.0)
Platelets: 191 10*3/uL (ref 150–400)
RBC: 3 MIL/uL — ABNORMAL LOW (ref 3.87–5.11)
RDW: 18.4 % — ABNORMAL HIGH (ref 11.5–15.5)
WBC: 7.5 10*3/uL (ref 4.0–10.5)

## 2014-09-26 LAB — APTT: aPTT: 32 seconds (ref 24–37)

## 2014-09-26 LAB — PROTIME-INR
INR: 1.1 (ref 0.00–1.49)
Prothrombin Time: 14.4 seconds (ref 11.6–15.2)

## 2014-09-26 SURGERY — BYPASS GRAFT FEMORAL-POPLITEAL ARTERY
Anesthesia: General | Site: Leg Lower | Laterality: Left

## 2014-09-26 MED ORDER — ROCURONIUM BROMIDE 100 MG/10ML IV SOLN
INTRAVENOUS | Status: DC | PRN
  Administered 2014-09-26: 40 mg via INTRAVENOUS

## 2014-09-26 MED ORDER — MORPHINE SULFATE 2 MG/ML IJ SOLN
INTRAMUSCULAR | Status: AC
  Filled 2014-09-26: qty 1

## 2014-09-26 MED ORDER — VANCOMYCIN HCL IN DEXTROSE 1-5 GM/200ML-% IV SOLN: 1000.0000 mg | Freq: Two times a day (BID) | INTRAVENOUS | Status: DC

## 2014-09-26 MED ORDER — VANCOMYCIN HCL IN DEXTROSE 1-5 GM/200ML-% IV SOLN
1000.0000 mg | INTRAVENOUS | Status: AC
  Administered 2014-09-26 – 2014-09-27 (×2): 1000 mg via INTRAVENOUS
  Filled 2014-09-26 (×3): qty 200

## 2014-09-26 MED ORDER — PHENOL 1.4 % MT LIQD
1.0000 | OROMUCOSAL | Status: DC | PRN
  Administered 2014-09-27: 1 via OROMUCOSAL
  Filled 2014-09-26: qty 177

## 2014-09-26 MED ORDER — PROPOFOL 10 MG/ML IV BOLUS
INTRAVENOUS | Status: DC | PRN
  Administered 2014-09-26: 20 mg via INTRAVENOUS
  Administered 2014-09-26: 50 mg via INTRAVENOUS

## 2014-09-26 MED ORDER — MAGNESIUM SULFATE 2 GM/50ML IV SOLN
2.0000 g | Freq: Every day | INTRAVENOUS | Status: DC | PRN
  Filled 2014-09-26: qty 50

## 2014-09-26 MED ORDER — HEPARIN SODIUM (PORCINE) 5000 UNIT/ML IJ SOLN
INTRAMUSCULAR | Status: DC | PRN
  Administered 2014-09-26: 500 mL

## 2014-09-26 MED ORDER — 0.9 % SODIUM CHLORIDE (POUR BTL) OPTIME
TOPICAL | Status: DC | PRN
  Administered 2014-09-26: 2000 mL

## 2014-09-26 MED ORDER — GUAIFENESIN-DM 100-10 MG/5ML PO SYRP: 15.0000 mL | ORAL_SOLUTION | ORAL | Status: DC | PRN

## 2014-09-26 MED ORDER — ENOXAPARIN SODIUM 30 MG/0.3ML ~~LOC~~ SOLN
30.0000 mg | SUBCUTANEOUS | Status: DC
  Administered 2014-09-26 – 2014-09-27 (×2): 30 mg via SUBCUTANEOUS
  Filled 2014-09-26 (×4): qty 0.3

## 2014-09-26 MED ORDER — PROTAMINE SULFATE 10 MG/ML IV SOLN
INTRAVENOUS | Status: DC | PRN
  Administered 2014-09-26: 50 mg via INTRAVENOUS

## 2014-09-26 MED ORDER — ACETAMINOPHEN 160 MG/5ML PO SOLN
325.0000 mg | ORAL | Status: DC | PRN
  Filled 2014-09-26: qty 20.3

## 2014-09-26 MED ORDER — SODIUM CHLORIDE 0.9 % IV SOLN
INTRAVENOUS | Status: DC
  Administered 2014-09-26: 16:00:00 via INTRAVENOUS

## 2014-09-26 MED ORDER — ONDANSETRON HCL 4 MG/2ML IJ SOLN
INTRAMUSCULAR | Status: DC | PRN
Start: 2014-09-26 — End: 2014-09-26
  Administered 2014-09-26: 4 mg via INTRAVENOUS

## 2014-09-26 MED ORDER — HYDRALAZINE HCL 20 MG/ML IJ SOLN: 5.0000 mg | INTRAMUSCULAR | Status: DC | PRN

## 2014-09-26 MED ORDER — POTASSIUM CHLORIDE CRYS ER 20 MEQ PO TBCR: 20.0000 meq | EXTENDED_RELEASE_TABLET | Freq: Every day | ORAL | Status: DC | PRN

## 2014-09-26 MED ORDER — MORPHINE SULFATE 2 MG/ML IJ SOLN
2.0000 mg | INTRAMUSCULAR | Status: DC | PRN
  Administered 2014-09-26: 2 mg via INTRAVENOUS
  Administered 2014-09-26: 4 mg via INTRAVENOUS
  Administered 2014-09-26 – 2014-09-27 (×2): 2 mg via INTRAVENOUS
  Administered 2014-09-27 (×2): 4 mg via INTRAVENOUS
  Administered 2014-09-28 (×2): 2 mg via INTRAVENOUS
  Filled 2014-09-26: qty 1
  Filled 2014-09-26: qty 2
  Filled 2014-09-26: qty 1
  Filled 2014-09-26 (×2): qty 2
  Filled 2014-09-26 (×2): qty 1

## 2014-09-26 MED ORDER — OXYCODONE HCL 5 MG PO TABS
ORAL_TABLET | ORAL | Status: AC
  Administered 2014-09-26: 5 mg via ORAL
  Filled 2014-09-26: qty 1

## 2014-09-26 MED ORDER — PANTOPRAZOLE SODIUM 40 MG PO TBEC
40.0000 mg | DELAYED_RELEASE_TABLET | Freq: Every day | ORAL | Status: DC
  Administered 2014-09-27 – 2014-09-28 (×2): 40 mg via ORAL
  Filled 2014-09-26 (×2): qty 1

## 2014-09-26 MED ORDER — OXYCODONE HCL 5 MG/5ML PO SOLN: 5.0000 mg | Freq: Once | ORAL | Status: AC | PRN

## 2014-09-26 MED ORDER — GLYCOPYRROLATE 0.2 MG/ML IJ SOLN
INTRAMUSCULAR | Status: DC | PRN
  Administered 2014-09-26: 0.4 mg via INTRAVENOUS

## 2014-09-26 MED ORDER — ALUM & MAG HYDROXIDE-SIMETH 200-200-20 MG/5ML PO SUSP
15.0000 mL | ORAL | Status: DC | PRN
  Administered 2014-09-28: 30 mL via ORAL
  Filled 2014-09-26: qty 30

## 2014-09-26 MED ORDER — LABETALOL HCL 5 MG/ML IV SOLN: 10.0000 mg | INTRAVENOUS | Status: DC | PRN

## 2014-09-26 MED ORDER — OXYCODONE-ACETAMINOPHEN 5-325 MG PO TABS
1.0000 | ORAL_TABLET | ORAL | Status: DC | PRN
  Administered 2014-09-26: 1 via ORAL
  Administered 2014-09-27: 2 via ORAL
  Administered 2014-09-27 – 2014-09-28 (×4): 1 via ORAL
  Filled 2014-09-26 (×3): qty 1
  Filled 2014-09-26: qty 2
  Filled 2014-09-26: qty 1
  Filled 2014-09-26: qty 2
  Filled 2014-09-26: qty 1

## 2014-09-26 MED ORDER — HEPARIN SODIUM (PORCINE) 1000 UNIT/ML IJ SOLN
INTRAMUSCULAR | Status: DC | PRN
  Administered 2014-09-26: 6000 [IU] via INTRAVENOUS

## 2014-09-26 MED ORDER — FENTANYL CITRATE (PF) 100 MCG/2ML IJ SOLN
25.0000 ug | INTRAMUSCULAR | Status: AC | PRN
  Administered 2014-09-26: 25 ug via INTRAVENOUS
  Administered 2014-09-26: 50 ug via INTRAVENOUS
  Administered 2014-09-26 (×4): 25 ug via INTRAVENOUS

## 2014-09-26 MED ORDER — ONDANSETRON HCL 4 MG/2ML IJ SOLN: 4.0000 mg | Freq: Four times a day (QID) | INTRAMUSCULAR | Status: DC | PRN

## 2014-09-26 MED ORDER — SODIUM CHLORIDE 0.9 % IV SOLN
10.0000 mg | INTRAVENOUS | Status: DC | PRN
  Administered 2014-09-26: 20 ug/min via INTRAVENOUS

## 2014-09-26 MED ORDER — LIDOCAINE HCL (CARDIAC) 20 MG/ML IV SOLN
INTRAVENOUS | Status: DC | PRN
  Administered 2014-09-26: 40 mg via INTRAVENOUS

## 2014-09-26 MED ORDER — FENTANYL CITRATE (PF) 100 MCG/2ML IJ SOLN
INTRAMUSCULAR | Status: DC | PRN
  Administered 2014-09-26: 50 ug via INTRAVENOUS
  Administered 2014-09-26: 100 ug via INTRAVENOUS
  Administered 2014-09-26 (×2): 50 ug via INTRAVENOUS

## 2014-09-26 MED ORDER — METOPROLOL TARTRATE 1 MG/ML IV SOLN
INTRAVENOUS | Status: DC | PRN
Start: 2014-09-26 — End: 2014-09-26
  Administered 2014-09-26: 5 mg via INTRAVENOUS

## 2014-09-26 MED ORDER — DOCUSATE SODIUM 100 MG PO CAPS
100.0000 mg | ORAL_CAPSULE | Freq: Every day | ORAL | Status: DC
  Administered 2014-09-27 – 2014-09-28 (×2): 100 mg via ORAL
  Filled 2014-09-26 (×2): qty 1

## 2014-09-26 MED ORDER — METOPROLOL TARTRATE 1 MG/ML IV SOLN: 2.0000 mg | INTRAVENOUS | Status: DC | PRN

## 2014-09-26 MED ORDER — ACETAMINOPHEN 325 MG PO TABS: 325.0000 mg | ORAL_TABLET | ORAL | Status: DC | PRN

## 2014-09-26 MED ORDER — NEOSTIGMINE METHYLSULFATE 10 MG/10ML IV SOLN
INTRAVENOUS | Status: DC | PRN
  Administered 2014-09-26: 3 mg via INTRAVENOUS

## 2014-09-26 MED ORDER — LACTATED RINGERS IV SOLN
INTRAVENOUS | Status: DC | PRN
  Administered 2014-09-26: 07:00:00 via INTRAVENOUS

## 2014-09-26 MED ORDER — OXYCODONE HCL 5 MG PO TABS
5.0000 mg | ORAL_TABLET | Freq: Once | ORAL | Status: AC | PRN
  Administered 2014-09-26: 5 mg via ORAL

## 2014-09-26 MED ORDER — FENTANYL CITRATE (PF) 100 MCG/2ML IJ SOLN
INTRAMUSCULAR | Status: AC
  Filled 2014-09-26: qty 2

## 2014-09-26 MED ORDER — SODIUM CHLORIDE 0.9 % IV SOLN: 500.0000 mL | Freq: Once | INTRAVENOUS | Status: AC | PRN

## 2014-09-26 MED ORDER — VANCOMYCIN HCL IN DEXTROSE 1-5 GM/200ML-% IV SOLN
INTRAVENOUS | Status: AC
  Filled 2014-09-26: qty 200

## 2014-09-26 SURGICAL SUPPLY — 56 items
BANDAGE ESMARK 6X9 LF (GAUZE/BANDAGES/DRESSINGS) IMPLANT
BENZOIN TINCTURE PRP APPL 2/3 (GAUZE/BANDAGES/DRESSINGS) ×6 IMPLANT
BNDG ESMARK 6X9 LF (GAUZE/BANDAGES/DRESSINGS)
CANISTER SUCTION 2500CC (MISCELLANEOUS) ×3 IMPLANT
CANNULA VESSEL 3MM 2 BLNT TIP (CANNULA) ×6 IMPLANT
CLIP LIGATING EXTRA MED SLVR (CLIP) ×3 IMPLANT
CLIP LIGATING EXTRA SM BLUE (MISCELLANEOUS) ×6 IMPLANT
CLOSURE STERI-STRIP 1/2X4 (GAUZE/BANDAGES/DRESSINGS) ×2
CLSR STERI-STRIP ANTIMIC 1/2X4 (GAUZE/BANDAGES/DRESSINGS) ×4 IMPLANT
CUFF TOURNIQUET SINGLE 34IN LL (TOURNIQUET CUFF) IMPLANT
CUFF TOURNIQUET SINGLE 44IN (TOURNIQUET CUFF) IMPLANT
DRAIN SNY 10X20 3/4 PERF (WOUND CARE) IMPLANT
DRAPE PROXIMA HALF (DRAPES) IMPLANT
DRAPE X-RAY CASS 24X20 (DRAPES) IMPLANT
DRSG COVADERM 4X10 (GAUZE/BANDAGES/DRESSINGS) ×3 IMPLANT
DRSG COVADERM 4X6 (GAUZE/BANDAGES/DRESSINGS) ×3 IMPLANT
DRSG COVADERM 4X8 (GAUZE/BANDAGES/DRESSINGS) IMPLANT
ELECT REM PT RETURN 9FT ADLT (ELECTROSURGICAL) ×3
ELECTRODE REM PT RTRN 9FT ADLT (ELECTROSURGICAL) ×1 IMPLANT
EVACUATOR SILICONE 100CC (DRAIN) IMPLANT
GAUZE SPONGE 4X4 12PLY STRL (GAUZE/BANDAGES/DRESSINGS) ×3 IMPLANT
GLOVE BIO SURGEON STRL SZ 6.5 (GLOVE) ×2 IMPLANT
GLOVE BIO SURGEONS STRL SZ 6.5 (GLOVE) ×1
GLOVE BIOGEL PI IND STRL 6.5 (GLOVE) ×4 IMPLANT
GLOVE BIOGEL PI IND STRL 7.5 (GLOVE) ×1 IMPLANT
GLOVE BIOGEL PI INDICATOR 6.5 (GLOVE) ×8
GLOVE BIOGEL PI INDICATOR 7.5 (GLOVE) ×2
GLOVE ECLIPSE 6.5 STRL STRAW (GLOVE) ×9 IMPLANT
GLOVE ECLIPSE 7.0 STRL STRAW (GLOVE) ×3 IMPLANT
GLOVE SS BIOGEL STRL SZ 7.5 (GLOVE) ×1 IMPLANT
GLOVE SUPERSENSE BIOGEL SZ 7.5 (GLOVE) ×2
GOWN STRL REUS W/ TWL LRG LVL3 (GOWN DISPOSABLE) ×5 IMPLANT
GOWN STRL REUS W/TWL LRG LVL3 (GOWN DISPOSABLE) ×10
INSERT FOGARTY SM (MISCELLANEOUS) IMPLANT
KIT BASIN OR (CUSTOM PROCEDURE TRAY) ×3 IMPLANT
KIT ROOM TURNOVER OR (KITS) ×3 IMPLANT
NS IRRIG 1000ML POUR BTL (IV SOLUTION) ×6 IMPLANT
PACK PERIPHERAL VASCULAR (CUSTOM PROCEDURE TRAY) ×3 IMPLANT
PAD ARMBOARD 7.5X6 YLW CONV (MISCELLANEOUS) ×6 IMPLANT
PADDING CAST COTTON 6X4 STRL (CAST SUPPLIES) IMPLANT
SET COLLECT BLD 21X3/4 12 (NEEDLE) IMPLANT
STAPLER VISISTAT 35W (STAPLE) IMPLANT
STOPCOCK 4 WAY LG BORE MALE ST (IV SETS) IMPLANT
SUT ETHILON 3 0 PS 1 (SUTURE) IMPLANT
SUT PROLENE 5 0 C 1 24 (SUTURE) ×3 IMPLANT
SUT PROLENE 6 0 CC (SUTURE) ×9 IMPLANT
SUT SILK 2 0 SH (SUTURE) ×3 IMPLANT
SUT SILK 3 0 (SUTURE) ×2
SUT SILK 3-0 18XBRD TIE 12 (SUTURE) ×1 IMPLANT
SUT VIC AB 2-0 CTX 36 (SUTURE) ×6 IMPLANT
SUT VIC AB 3-0 SH 27 (SUTURE) ×6
SUT VIC AB 3-0 SH 27X BRD (SUTURE) ×3 IMPLANT
TRAY FOLEY W/METER SILVER 16FR (SET/KITS/TRAYS/PACK) ×3 IMPLANT
TUBING EXTENTION W/L.L. (IV SETS) IMPLANT
UNDERPAD 30X30 INCONTINENT (UNDERPADS AND DIAPERS) ×3 IMPLANT
WATER STERILE IRR 1000ML POUR (IV SOLUTION) ×3 IMPLANT

## 2014-09-26 NOTE — Progress Notes (Signed)
Subjective: No acute events overnight. Saw in PACU, drowsy but arousable. BP in low 100s.   Objective: Vital signs in last 24 hours: Filed Vitals:   09/25/14 2026 09/26/14 0030 09/26/14 0100 09/26/14 0540  BP: 134/68   147/74  Pulse: 85 76 76 90  Temp: 98.2 F (36.8 C)   98.2 F (36.8 C)  TempSrc: Oral   Oral  Resp: 18 18  18   Height:      Weight:      SpO2: 94%   100%   Weight change:   Intake/Output Summary (Last 24 hours) at 09/26/14 1051 Last data filed at 09/26/14 1020  Gross per 24 hour  Intake   1914 ml  Output    600 ml  Net   1314 ml    Physical Exam:  General: Drowsy but arousable, cooperative, NAD. HEENT: EOMI.  Neck: Full range of motion without pain Lungs: Anterior fields clear to ascultation bilaterally, normal work of respiration, no wheezes, rales, rhonchi Heart: RRR, no murmurs, gallops, or rubs Abdomen: Soft, non-tender, non-distended, BS + Extremities: No cyanosis, clubbing, or edema. Ulceration of left great toenail bed, all nails with severe onchomycosis. Neurologic: Drowsy but arousable, otherwise unable to assess due to patient status  Lab Results:  CBC:  Recent Labs Lab 09/21/14 1047 09/23/14 1410 09/24/14 0445 09/25/14 0532 09/26/14 0437  WBC  --  10.7* 7.7 6.7 7.5  HGB 11.9* 9.8* 8.5* 7.7* 9.6*  HCT 35.0* 29.8* 25.9* 24.0* 29.7*  MCV  --  102.1* 103.6* 101.7* 99.0  PLT  --  228 208 212 191    Basic Metabolic Panel:  Recent Labs Lab 09/21/14 1047  09/23/14 1410 09/24/14 0445 09/25/14 0532 09/26/14 0437  NA 140  --  139 139 138 138  K 4.3  --  3.9 3.2* 3.8 4.2  CL 106  --  106 107 109 111  CO2  --   --  22 24 23 23   GLUCOSE 78  --  108* 88 86 87  BUN 28*  --  17 17 10 7   CREATININE 1.70*  --  1.59* 1.35* 0.97 0.89  CALCIUM  --   < > 8.8* 8.1* 8.0* 8.3*  MG  --   --   --  1.7 1.9  --   < > = values in this interval not displayed.  Liver Function Tests:  Recent Labs Lab 09/23/14 1410 09/26/14 0437  AST 14* 13*    ALT 10* 7*  ALKPHOS 46 39  BILITOT 0.4 0.5  PROT 6.4* 5.3*  ALBUMIN 2.6* 2.0*   Micro Results: Recent Results (from the past 240 hour(s))  Blood culture (routine x 2)     Status: None (Preliminary result)   Collection Time: 09/23/14  2:00 PM  Result Value Ref Range Status   Specimen Description BLOOD LEFT ANTECUBITAL  Final   Special Requests BOTTLES DRAWN AEROBIC AND ANAEROBIC 5CC  Final   Culture NO GROWTH 2 DAYS  Final   Report Status PENDING  Incomplete  Blood culture (routine x 2)     Status: None (Preliminary result)   Collection Time: 09/23/14  2:15 PM  Result Value Ref Range Status   Specimen Description BLOOD RIGHT HAND  Final   Special Requests BOTTLES DRAWN AEROBIC AND ANAEROBIC 5CC  Final   Culture NO GROWTH 2 DAYS  Final   Report Status PENDING  Incomplete  MRSA PCR Screening     Status: None   Collection Time: 09/23/14  5:59  PM  Result Value Ref Range Status   MRSA by PCR NEGATIVE NEGATIVE Final    Comment:        The GeneXpert MRSA Assay (FDA approved for NASAL specimens only), is one component of a comprehensive MRSA colonization surveillance program. It is not intended to diagnose MRSA infection nor to guide or monitor treatment for MRSA infections.     Medications: I have reviewed the patient's current medications. Scheduled Meds: . [MAR Hold] aspirin EC  81 mg Oral Daily  . [MAR Hold] enoxaparin (LOVENOX) injection  30 mg Subcutaneous Q24H  . [MAR Hold] folic acid  1 mg Oral Daily  . [MAR Hold] simvastatin  20 mg Oral q1800  . [MAR Hold] sodium chloride  10-40 mL Intracatheter Q12H  . [MAR Hold] sodium chloride  3 mL Intravenous Q12H  . [MAR Hold] vitamin B-12  1,000 mcg Oral Daily   Continuous Infusions: . sodium chloride 1,000 mL (09/25/14 2345)   PRN Meds:.acetaminophen **OR** acetaminophen (TYLENOL) oral liquid 160 mg/5 mL, [MAR Hold] acetaminophen, fentaNYL (SUBLIMAZE) injection, hydrALAZINE, labetalol, metoprolol, [MAR Hold] oxyCODONE,  oxyCODONE **OR** oxyCODONE, [MAR Hold] sodium chloride  Assessment/Plan: Karina Willis is a 81 y.o. yo female with a PMHx of CAD, systolic CHF, HTN, HLD and PAD with left food ischemia who was admitted on 09/23/2014 with hypotension, which was determined to be most likely secondary to overmedication for HTN. Volume depletion may also contribute. Now s/p left femoral above the knee popliteal bypass.  Hypotension- BP improved this AM, maintaining systolics in 100s Explains positional lightheadedness and presyncopal sx. Most likely caused by continued use of BP medications despite lower pressures documented last admission.  - cont to hold home BP meds, consider adding amlodipine 2.5mg  daily if elevated - Cardiac monitoring  Gangrenous left great toe 2/2 PAD, s/p femoral-popliteal bypass (7/18) Procedure w/Dr. Arbie Cookey this AM went well with no complications. Will await post-op management plan from vascular.  - PT eval - requires SNF placement, SW working on this  Macrocytic anemia - chronic, baseline Hb ~10 B12 deficiency dx last admission, to be managed by PCP once established at [facility]. Received 1 unit RBC for Hb 7.7 per vascular yesterday, 9.6 today. - continue Vit B12 1,021mcg tablet po daily - f/u CBC in clinic  HLD - stable, outpatient Continue home simvastatin  po daily.  CAD - stable, outpatient.  Follows with Dr. Peter Swaziland (cardiology). Continue aspirin  daily.  FEN - Saline lock - Replete electrolytes prn - NPO  Access: R IJ CVC  DVT ppx: lovenox  Code status: FULL  Disposition: Requires SNF placement. Disposition is deferred at this time, awaiting improvement of current medical problems. Anticipated discharge in approximately 1-2 day(s). Needs [facility] clinic f/u appt.   This is a Psychologist, occupational Note.  The care of the patient was discussed with Dr. Yetta Barre and the assessment and plan formulated with their assistance.  Please see their attached note for official  documentation of the daily encounter.   LOS: 3 days   Ervin Knack, Med Student 09/26/2014, 10:51 AM

## 2014-09-26 NOTE — Care Management Important Message (Signed)
Important Message  Patient Details  Name: Karina Willis MRN: [id number] Date of Birth: 10/15/1932   Medicare Important Message Given:  Yes-fourth notification given    Yvonna Alanisndra M Robinson 09/26/2014, 2:45 PM

## 2014-09-26 NOTE — Progress Notes (Signed)
Patient seen and examined. Case d/w residents in detail. I agree with findings and plan as documented in Dr. Yetta BarreJones' note.  Patient seen in PACU s/p left fem-pop bypass. Feels ok. No new complaints. Hypotension resolved. Will monitor off antihypertensives for now. If BP elevated can start low dose amlodipine and monitor

## 2014-09-26 NOTE — Transfer of Care (Signed)
Immediate Anesthesia Transfer of Care Note  Patient: Lajean Silviusorma J Polson  Procedure(s) Performed: Procedure(s): LEFT FEMORAL-ABOVE THE KNEE POPLITEAL ARTERY BYPASS GRAFT USING NON REVERSE LEFT GREATER SAPHENOUS VEIN.  (Left)  Patient Location: PACU  Anesthesia Type:General  Level of Consciousness: awake, alert  and oriented  Airway & Oxygen Therapy: Patient Spontanous Breathing and Patient connected to face mask oxygen  Post-op Assessment: Report given to RN, Post -op Vital signs reviewed and stable and Patient moving all extremities  Post vital signs: Reviewed and stable  Last Vitals:  Filed Vitals:   09/26/14 0540  BP: 147/74  Pulse: 90  Temp: 36.8 C  Resp: 18    Complications: No apparent anesthesia complications

## 2014-09-26 NOTE — Anesthesia Preprocedure Evaluation (Addendum)
Anesthesia Evaluation  Patient identified by MRN, date of birth, ID band Patient awake    Reviewed: Allergy & Precautions, NPO status , Patient's Chart, lab work & pertinent test results  History of Anesthesia Complications Negative for: history of anesthetic complications  Airway Mallampati: II  TM Distance: >3 FB Neck ROM: Full    Dental  (+) Poor Dentition   Pulmonary shortness of breath, neg sleep apnea, neg COPDneg recent URI, former smoker,  breath sounds clear to auscultation        Cardiovascular hypertension, Pt. on medications and Pt. on home beta blockers - angina+ CAD, + Peripheral Vascular Disease and +CHF - dysrhythmias + Valvular Problems/Murmurs MR Rhythm:Regular     Neuro/Psych negative neurological ROS  negative psych ROS   GI/Hepatic negative GI ROS, Neg liver ROS,   Endo/Other  negative endocrine ROS  Renal/GU CRFRenal disease     Musculoskeletal  (+) Arthritis -,   Abdominal   Peds  Hematology  (+) anemia ,   Anesthesia Other Findings   Reproductive/Obstetrics                            Anesthesia Physical Anesthesia Plan  ASA: III  Anesthesia Plan: General   Post-op Pain Management:    Induction: Intravenous  Airway Management Planned: Oral ETT  Additional Equipment: None  Intra-op Plan:   Post-operative Plan: Extubation in OR  Informed Consent: I have reviewed the patients History and Physical, chart, labs and discussed the procedure including the risks, benefits and alternatives for the proposed anesthesia with the patient or authorized representative who has indicated his/her understanding and acceptance.   Dental advisory given  Plan Discussed with: CRNA and Surgeon  Anesthesia Plan Comments:         Anesthesia Quick Evaluation

## 2014-09-26 NOTE — Progress Notes (Signed)
        Patient alert and stable.  Doppler DP/peroneal signals Incisional dressings clean and dry  S/P left Fem/popliteal above knee by pass  Syleena Mchan MAUREEN PA-C

## 2014-09-26 NOTE — Anesthesia Postprocedure Evaluation (Signed)
  Anesthesia Post-op Note  Patient: Karina Willis  Procedure(s) Performed: Procedure(s): LEFT FEMORAL-ABOVE THE KNEE POPLITEAL ARTERY BYPASS GRAFT USING NON REVERSE LEFT GREATER SAPHENOUS VEIN.  (Left)  Patient Location: PACU  Anesthesia Type:General  Level of Consciousness: awake  Airway and Oxygen Therapy: Patient Spontanous Breathing  Post-op Pain: mild  Post-op Assessment: Post-op Vital signs reviewed, Patient's Cardiovascular Status Stable, Respiratory Function Stable, Patent Airway, No signs of Nausea or vomiting, Adequate PO intake and Pain level controlled   LLE Sensation: Pain, No numbness, No tingling   RLE Sensation: Full sensation      Post-op Vital Signs: Reviewed and stable  Last Vitals:  Filed Vitals:   09/26/14 1600  BP: 123/58  Pulse: 85  Temp: 36.9 C  Resp: 16    Complications: No apparent anesthesia complications

## 2014-09-26 NOTE — Progress Notes (Signed)
ANTIBIOTIC CONSULT NOTE - INITIAL  Pharmacy Consult for Vanc Indication: Post-op surgical prophylaxis  Allergies  Allergen Reactions  . Penicillins Hives and Itching    Pt reports not being allergic to penicillin Tolerates Ancef    Patient Measurements: Height: 4\' 11"  (149.9 cm) Weight: 132 lb 7.9 oz (60.1 kg) IBW/kg (Calculated) : 43.2  Vital Signs: Temp: 98.5 F (36.9 C) (07/18 1600) Temp Source: Axillary (07/18 1600) BP: 123/58 mmHg (07/18 1600) Pulse Rate: 85 (07/18 1600) Intake/Output from previous day: 07/17 0701 - 07/18 0700 In: 964 [P.O.:450; I.V.:170; Blood:344] Out: -  Intake/Output from this shift: Total I/O In: 950 [I.V.:950] Out: 850 [Urine:700; Blood:150]  Labs:  Recent Labs  09/24/14 0445 09/25/14 0532 09/26/14 0437  WBC 7.7 6.7 7.5  HGB 8.5* 7.7* 9.6*  PLT 208 212 191  CREATININE 1.35* 0.97 0.89   Estimated Creatinine Clearance: 39.8 mL/min (by C-G formula based on Cr of 0.89). No results for input(s): VANCOTROUGH, VANCOPEAK, VANCORANDOM, GENTTROUGH, GENTPEAK, GENTRANDOM, TOBRATROUGH, TOBRAPEAK, TOBRARND, AMIKACINPEAK, AMIKACINTROU, AMIKACIN in the last 72 hours.   Microbiology: Recent Results (from the past 720 hour(s))  Blood culture (routine x 2)     Status: None (Preliminary result)   Collection Time: 09/23/14  2:00 PM  Result Value Ref Range Status   Specimen Description BLOOD LEFT ANTECUBITAL  Final   Special Requests BOTTLES DRAWN AEROBIC AND ANAEROBIC 5CC  Final   Culture NO GROWTH 3 DAYS  Final   Report Status PENDING  Incomplete  Blood culture (routine x 2)     Status: None (Preliminary result)   Collection Time: 09/23/14  2:15 PM  Result Value Ref Range Status   Specimen Description BLOOD RIGHT HAND  Final   Special Requests BOTTLES DRAWN AEROBIC AND ANAEROBIC 5CC  Final   Culture NO GROWTH 3 DAYS  Final   Report Status PENDING  Incomplete  MRSA PCR Screening     Status: None   Collection Time: 09/23/14  5:59 PM  Result Value  Ref Range Status   MRSA by PCR NEGATIVE NEGATIVE Final    Comment:        The GeneXpert MRSA Assay (FDA approved for NASAL specimens only), is one component of a comprehensive MRSA colonization surveillance program. It is not intended to diagnose MRSA infection nor to guide or monitor treatment for MRSA infections.     Medical History: Past Medical History  Diagnosis Date  . CHF (congestive heart failure)   . Systolic dysfunction     CHRONIC, EF 25-30%  . Mitral insufficiency     SEVERE  . Coronary artery disease 2001    WITH DOCUMENTED TOTAL OCCLUSION OF THE RIGHT CORONARY AND THE LEFT CIRCUMFLEX CORONARY  . SOB (shortness of breath)   . Hypertension   . Hyperlipidemia   . History of TIAs   . Arthritis   . Non compliance w medication regimen   . Neck pain     Assessment: 82 yof with Vanc 1000mg  IV q12h ordered for post-op surgical ppx x 2 doses. S/p L fem/pop above knee bypass on 7/18.  Pharmacy may adjust abx for renal function - wt 60.1kg, SCr 0.89 stable, CrCl~40 >> adjuted dose to Vanc 1g IV q24h x 2 doses. Afebrile, wbc wnl  Plan Vanc to 1g IV q24h x 2 doses post-op   Goal of Therapy:  Appropriate dosing  Plan:  Vanc to 1g IV q24h x 2 doses post-op Mon renal function  Babs BertinHaley Amilee Janvier, PharmD Clinical Pharmacist Pager [phone] 09/26/2014  4:37 PM

## 2014-09-26 NOTE — Interval H&P Note (Signed)
History and Physical Interval Note:  09/26/2014 7:00 AM  Karina Willis  has presented today for surgery, with the diagnosis of Left foot ulcer L97.409  The various methods of treatment have been discussed with the patient and family. After consideration of risks, benefits and other options for treatment, the patient has consented to  Procedure(s): BYPASS GRAFT FEMORAL-POPLITEAL ARTERY (Left) as a surgical intervention .  The patient's history has been reviewed, patient examined, no change in status, stable for surgery.  I have reviewed the patient's chart and labs.  Questions were answered to the patient's satisfaction.     Gretta BeganEarly, Harriett Azar

## 2014-09-26 NOTE — Progress Notes (Signed)
Subjective: Seen in PACU, still lethargic s/p anesthesia. Feels okay, made a joke.   Objective: Vital signs in last 24 hours: Filed Vitals:   09/26/14 1040 09/26/14 1045 09/26/14 1100 09/26/14 1115  BP:  127/62 115/62 114/61  Pulse:   64 64  Temp:  97.9 F (36.6 C)    TempSrc:      Resp: Height:      Weight:      SpO2: 100% 100% 95% 96%   Weight change:   Intake/Output Summary (Last 24 hours) at 09/26/14 1134 Last data filed at 09/26/14 1020  Gross per 24 hour  Intake   1914 ml  Output    600 ml  Net   1314 ml   Physical Exam: General: AA female, cooperative, NAD. Lethargic.  HEENT: PERRL, EOMI. Moist mucus membranes Neck: Full range of motion without pain, supple, no lymphadenopathy or carotid bruits. RIJ CVL. Lungs: Clear to ascultation bilaterally, normal work of respiration, no wheezes, rales, rhonchi Heart: RRR, no murmurs, gallops, or rubs Abdomen: Soft, non-tender, non-distended, BS +. Foley placed.  Extremities: No cyanosis, clubbing, or edema. Left great toe w/ ulceration surrounding toenail. Uncut toenails. Obvious onychomycosis. Weak distal pulses bilaterally.  Neurologic: Alert & oriented x3, cranial nerves II-XII intact, strength grossly intact, sensation intact to light touch   Lab Results: Basic Metabolic Panel:  Recent Labs Lab 09/24/14 0445 09/25/14 0532 09/26/14 0437  NA 139 138 138  K 3.2* 3.8 4.2  CL 107 109 111  CO2 GLUCOSE 88 86 87  BUN CREATININE 1.35* 0.97 0.89  CALCIUM 8.1* 8.0* 8.3*  MG 1.7 1.9  --    Liver Function Tests:  Recent Labs Lab 09/23/14 1410 09/26/14 0437  AST 14* 13*  ALT 10* 7*  ALKPHOS 46 39  BILITOT 0.4 0.5  PROT 6.4* 5.3*  ALBUMIN 2.6* 2.0*   CBC:  Recent Labs Lab 09/25/14 0532 09/26/14 0437  WBC 6.7 7.5  HGB 7.7* 9.6*  HCT 24.0* 29.7*  MCV 101.7* 99.0  PLT 212 191   Urine Drug Screen: Drugs of Abuse     Component Value Date/Time   LABOPIA NONE DETECTED  08/02/2013 0926   COCAINSCRNUR NONE DETECTED 08/02/2013 0926   LABBENZ NONE DETECTED 08/02/2013 0926   AMPHETMU NONE DETECTED 08/02/2013 0926   THCU NONE DETECTED 08/02/2013 0926   LABBARB NONE DETECTED 08/02/2013 0926    Urinalysis:  Recent Labs Lab 09/23/14 1730  COLORURINE YELLOW  LABSPEC 1.008  PHURINE 5.5  GLUCOSEU NEGATIVE  HGBUR NEGATIVE  BILIRUBINUR NEGATIVE  KETONESUR NEGATIVE  PROTEINUR NEGATIVE  UROBILINOGEN 0.2  NITRITE NEGATIVE  LEUKOCYTESUR NEGATIVE    Micro Results: Recent Results (from the past 240 hour(s))  Blood culture (routine x 2)     Status: None (Preliminary result)   Collection Time: 09/23/14  2:00 PM  Result Value Ref Range Status   Specimen Description BLOOD LEFT ANTECUBITAL  Final   Special Requests BOTTLES DRAWN AEROBIC AND ANAEROBIC 5CC  Final   Culture NO GROWTH 2 DAYS  Final   Report Status PENDING  Incomplete  Blood culture (routine x 2)     Status: None (Preliminary result)   Collection Time: 09/23/14  2:15 PM  Result Value Ref Range Status   Specimen Description BLOOD RIGHT HAND  Final   Special Requests BOTTLES DRAWN AEROBIC AND ANAEROBIC 5CC  Final   Culture NO GROWTH 2 DAYS  Final  Report Status PENDING  Incomplete  MRSA PCR Screening     Status: None   Collection Time: 09/23/14  5:59 PM  Result Value Ref Range Status   MRSA by PCR NEGATIVE NEGATIVE Final    Comment:        The GeneXpert MRSA Assay (FDA approved for NASAL specimens only), is one component of a comprehensive MRSA colonization surveillance program. It is not intended to diagnose MRSA infection nor to guide or monitor treatment for MRSA infections.    Medications: I have reviewed the patient's current medications. Scheduled Meds: . [MAR Hold] aspirin EC  81 mg Oral Daily  . [MAR Hold] enoxaparin (LOVENOX) injection  30 mg Subcutaneous Q24H  . fentaNYL      . [MAR Hold] folic acid  1 mg Oral Daily  . [MAR Hold] simvastatin  20 mg Oral q1800  . [MAR  Hold] sodium chloride  10-40 mL Intracatheter Q12H  . [MAR Hold] sodium chloride  3 mL Intravenous Q12H  . [MAR Hold] vitamin B-12  1,000 mcg Oral Daily   Continuous Infusions: . sodium chloride 1,000 mL (09/25/14 2345)  . sodium chloride     PRN Meds:.acetaminophen **OR** acetaminophen (TYLENOL) oral liquid 160 mg/5 mL, [MAR Hold] acetaminophen, fentaNYL (SUBLIMAZE) injection, hydrALAZINE, labetalol, metoprolol, [MAR Hold] oxyCODONE, oxyCODONE **OR** oxyCODONE, [MAR Hold] sodium chloride   Assessment/Plan: [name] is a 81 y.o. female w/ PMHx of chronic sCHF (EF 25-30%), CAD, HTN, and HLD, admitted for hypotension.   Hypotension: Resolved.  -Continue to HOLD BP medications.  -If elevated, can restart Norvasc 2.5 mg daily.   Left Great Toe Wound: Has been a chronic issue, related to vascular disease. Do not feel this is contributing to her clinical picture at this time. Now s/p fem-pop bypass.  -Wound care consult appreciated -For fem-pop in AM  PVD: Now s/p left fem-pop bypass.  -Management per VVS  Macrocytic Anemia: Likely 2/2 B12 deficiency. Hb 9.6 this AM s/p 1 unit PRBC's.   -Continue B12 -F/u CBC in clinic  HTN: As above -Hold home medications. Can possibly resume Norvasc 2.5 mg daily if needed and titrate as necessary.  HLD: Stable.  -Continue Zocor  CAD: Significant LCx disease. Follows w/ Dr. Swazil[place]Jordan. Denies chest pain. -Continue ASA + Zocor  DVT/PE PPx: Lovenox [place]  Dispo:  Anticipated discharge 1-2 days s/p right fem-pop bypass.   The patient does have a current PCP (Peter M Sw[place]Jordan, MD) and [facility].  The patient does not have transportation limitations that hinder transportation to clinic appointments.  .Services Needed at time of discharge: Y = Yes, Blank = No PT:   OT:   RN:   Equipment:   Other:     LOS: 3 days   Courtney ParisEden W Pharrah Rottman, MD 09/26/2014, 11:34 AM

## 2014-09-26 NOTE — Op Note (Signed)
OPERATIVE REPORT  DATE OF SURGERY: 09/26/2014  PATIENT: Karina Willis, 81 y.o. female MRN: [id number]  DOB: Mar 27, 1932  PRE-OPERATIVE DIAGNOSIS: Rest pain and Karina Willis gangrenous changes left foot with severe arterial insufficiency  POST-OPERATIVE DIAGNOSIS:  Same  PROCEDURE: Left femoral to above-knee popliteal bypass with translocated non-reversed great saphenous vein  SURGEON:  Karina Began, M.D.  PHYSICIAN ASSISTANT: Collins  ANESTHESIA:  Gen.  EBL: Less than 100 ml  Total I/O In: 950 [I.V.:950] Out: 600 [Urine:450; Blood:150]  BLOOD ADMINISTERED: None  DRAINS: None  SPECIMEN: None  COUNTS CORRECT:  YES  PLAN OF CARE: PACU   PATIENT DISPOSITION:  PACU - hemodynamically stable  PROCEDURE DETAILS: The patient was taken to the operating room placed supine position where the area of the left groin and left leg were prepped and draped in usual sterile fashion SonoSite ultrasound was used to visualize the saphenous vein which was good caliber to the level of the knee. An incision was made over the femoral pulse and carried down to isolate the common superficial femoral performance femoris arteries. The patient had good pulse in the common femoral artery. There was complete occlusion of the superficial femoral artery at its origin. The saphenous vein was identified saphenofemoral junction. Tributary branches at the saphenofemoral junction were ligated and divided. The vein was mobilized through a subcutaneous tunnel. A separate small incision was made in the thigh for vein harvest and then another incision was made in the above-knee medial physician for vein harvest and exposure of the popliteal artery. The vein was of good caliber down to the level of the knee and then branched. The above-knee popliteal artery was exposed. It was a extensively thickened and calcified but was patent on preoperative arteriogram. A tunnel was created from the level of the above-knee popliteal artery  to the level of the groin. The vein was ligated distally and divided. The saphenofemoral junction was occluded with a Cooley clamp and the vein was excised from the saphenofemoral junction. The saphenofemoral junction was oversewn with a 50 proline suture. The patient was given 6000 units intravenous heparin and after adequate circulation time the common superficial femoral and profunda femoris arteries were occluded. The common femoral artery was opened with 11 blade incision longitudinally with Potts scissors. The vein was spatulated and sewn into side to the common femoral artery superficial femoral artery junction with a running 60 proline suture. Anastomosis tested and found to be adequate. Next a Karina Willis was used to lyse the vein valves are this gave a very nice flow through the vein bypass. The bypass was reoccluded and was brought to the prior created tunnel down to the level of the above-knee popliteal artery. The above-knee popliteal artery was controlled proximally and distally with Vesseloops and was opened with 11 blade some ulcerative Potts scissors. There was a good flow lumen. There was extensive calcification of the artery. The vein was cut to appropriate length and was spatulated and sewn end-to-side to the artery with a running 60 proline suture. Prior to completion of the anastomosis undertaken. Anastomosis completed and flow was restored. Patient had been graft dependent Doppler flow at the peroneal artery at the ankle. Patient was given 50 mg of protamine to reverse the heparin. Wounds irrigated with saline. Hemostasis tablet cautery. Wounds were closed with 2:00 role in the fascia in the groin and popliteal incision the vein harvest incision was closed with 3-0 Monocryl in the fat layer and the skin was closed with 3-0  subcuticular Vicryls and all 3 incisions. Sterile dressing was applied the patient was transferred to the recovery room in stable condition   Karina Willis,  M.D. 09/26/2014 10:38 AM

## 2014-09-26 NOTE — Anesthesia Procedure Notes (Signed)
Procedure Name: Intubation Date/Time: 09/26/2014 7:37 AM Performed by: Kyung Rudd Pre-anesthesia Checklist: Patient identified, Emergency Drugs available, Suction available, Patient being monitored and Timeout performed Patient Re-evaluated:Patient Re-evaluated prior to inductionOxygen Delivery Method: Circle system utilized Preoxygenation: Pre-oxygenation with 100% oxygen Intubation Type: IV induction Ventilation: Mask ventilation without difficulty Laryngoscope Size: Mac and 3 Grade View: Grade I Tube type: Oral Tube size: 7.0 mm Number of attempts: 1 Airway Equipment and Method: LTA kit utilized Placement Confirmation: ETT inserted through vocal cords under direct vision,  positive ETCO2 and breath sounds checked- equal and bilateral Secured at: 21 cm Tube secured with: Tape Dental Injury: Teeth and Oropharynx as per pre-operative assessment

## 2014-09-26 NOTE — H&P (View-Only) (Signed)
VASCULAR SURGERY:  Notified that pt is in the hospital. Scheduled for left fempop bypass tomorrow by Dr. Arbie CookeyEarly. Hgb is 7.7 Will need blood preop. Has a history of CHF and Stage 3 CKD.  Will give 1 unit slowly and defer further treatment to primary service Preop orders written.  Waverly Ferrarihristopher Shyloh Derosa, MD, FACS Beeper [phone] Office: [phone]

## 2014-09-27 ENCOUNTER — Encounter (HOSPITAL_COMMUNITY): Payer: Self-pay | Admitting: Vascular Surgery

## 2014-09-27 ENCOUNTER — Inpatient Hospital Stay (HOSPITAL_COMMUNITY): Payer: Medicare Other

## 2014-09-27 DIAGNOSIS — Z9889 Other specified postprocedural states: Secondary | ICD-10-CM

## 2014-09-27 LAB — CBC
HEMATOCRIT: 25.8 % — AB (ref 36.0–46.0)
Hemoglobin: 8.5 g/dL — ABNORMAL LOW (ref 12.0–15.0)
MCH: 32.7 pg (ref 26.0–34.0)
MCHC: 32.9 g/dL (ref 30.0–36.0)
MCV: 99.2 fL (ref 78.0–100.0)
Platelets: 165 10*3/uL (ref 150–400)
RBC: 2.6 MIL/uL — AB (ref 3.87–5.11)
RDW: 17.3 % — AB (ref 11.5–15.5)
WBC: 8.8 10*3/uL (ref 4.0–10.5)

## 2014-09-27 LAB — BASIC METABOLIC PANEL
Anion gap: 4 — ABNORMAL LOW (ref 5–15)
BUN: 6 mg/dL (ref 6–20)
CO2: 23 mmol/L (ref 22–32)
Calcium: 7.8 mg/dL — ABNORMAL LOW (ref 8.9–10.3)
Chloride: 107 mmol/L (ref 101–111)
Creatinine, Ser: 0.77 mg/dL (ref 0.44–1.00)
GFR calc Af Amer: >60 mL/min (ref >60)
Glucose, Bld: 81 mg/dL (ref 65–99)
POTASSIUM: 4 mmol/L (ref 3.5–5.1)
SODIUM: 134 mmol/L — AB (ref 135–145)

## 2014-09-27 LAB — TYPE AND SCREEN
ABO/RH(D): A POS
ANTIBODY SCREEN: NEGATIVE
UNIT DIVISION: 0
Unit division: 0

## 2014-09-27 NOTE — Progress Notes (Signed)
MD paged regarding 6 beat run of vtach. No new orders given at this time. Will continue to monitor closely.  Sandrea HammondJunris Aarilyn Dye RN

## 2014-09-27 NOTE — Progress Notes (Signed)
Pt transferred to 2w29 per MD order. Report called to receiving nurse and all questions answered. Bladder scan preformed prior to transfer per post foely removal protocol. 106 ml noted. Receiving nurse updated.

## 2014-09-27 NOTE — Progress Notes (Signed)
Subjective: Having some LLE pain. Doing well otherwise.   Objective: Vital signs in last 24 hours: Filed Vitals:   09/27/14 0000 09/27/14 0421 09/27/14 0700 09/27/14 0710  BP: 122/66 115/55  111/52  Pulse: 111 93  104  Temp:  99.2 F (37.3 C) 98.9 F (37.2 C)   TempSrc:  Oral Oral   Resp: Height:      Weight:      SpO2: 100% 97%  96%   Weight change:   Intake/Output Summary (Last 24 hours) at 09/27/14 1101 Last data filed at 09/27/14 0646  Gross per 24 hour  Intake    583 ml  Output    725 ml  Net   -142 ml   Physical Exam: General: AA female, alert, cooperative, NAD. HEENT: PERRL, EOMI. Moist mucus membranes Neck: Full range of motion without pain, supple, no lymphadenopathy or carotid bruits. RIJ CVL. Lungs: Clear to ascultation bilaterally, normal work of respiration, no wheezes, rales, rhonchi Heart: RRR, no murmurs, gallops, or rubs Abdomen: Soft, non-tender, non-distended, BS +.  Extremities: No cyanosis, clubbing, or edema. Left great toe w/ ulceration surrounding toenail. Uncut toenails. Obvious onychomycosis. Weak distal pulses bilaterally.  Neurologic: Alert & oriented x3, cranial nerves II-XII intact, strength grossly intact, sensation intact to light touch   Lab Results: Basic Metabolic Panel:  Recent Labs Lab 09/24/14 0445 09/25/14 0532 09/26/14 0437 09/27/14 0445  NA 139 138 138 134*  K 3.2* 3.8 4.2 4.0  CL 107 109 111 107  CO2 GLUCOSE 88 86 87 81  BUN CREATININE 1.35* 0.97 0.89 0.77  CALCIUM 8.1* 8.0* 8.3* 7.8*  MG 1.7 1.9  --   --    Liver Function Tests:  Recent Labs Lab 09/23/14 1410 09/26/14 0437  AST 14* 13*  ALT 10* 7*  ALKPHOS 46 39  BILITOT 0.4 0.5  PROT 6.4* 5.3*  ALBUMIN 2.6* 2.0*   CBC:  Recent Labs Lab 09/26/14 0437 09/27/14 0445  WBC 7.5 8.8  HGB 9.6* 8.5*  HCT 29.7* 25.8*  MCV 99.0 99.2  PLT 191 165   Urine Drug Screen: Drugs of Abuse     Component Value Date/Time     LABOPIA NONE DETECTED 08/02/2013 0926   COCAINSCRNUR NONE DETECTED 08/02/2013 0926   LABBENZ NONE DETECTED 08/02/2013 0926   AMPHETMU NONE DETECTED 08/02/2013 0926   THCU NONE DETECTED 08/02/2013 0926   LABBARB NONE DETECTED 08/02/2013 0926    Urinalysis:  Recent Labs Lab 09/23/14 1730  COLORURINE YELLOW  LABSPEC 1.008  PHURINE 5.5  GLUCOSEU NEGATIVE  HGBUR NEGATIVE  BILIRUBINUR NEGATIVE  KETONESUR NEGATIVE  PROTEINUR NEGATIVE  UROBILINOGEN 0.2  NITRITE NEGATIVE  LEUKOCYTESUR NEGATIVE    Micro Results: Recent Results (from the past 240 hour(s))  Blood culture (routine x 2)     Status: None (Preliminary result)   Collection Time: 09/23/14  2:00 PM  Result Value Ref Range Status   Specimen Description BLOOD LEFT ANTECUBITAL  Final   Special Requests BOTTLES DRAWN AEROBIC AND ANAEROBIC 5CC  Final   Culture NO GROWTH 3 DAYS  Final   Report Status PENDING  Incomplete  Blood culture (routine x 2)     Status: None (Preliminary result)   Collection Time: 09/23/14  2:15 PM  Result Value Ref Range Status   Specimen Description BLOOD RIGHT HAND  Final   Special Requests BOTTLES DRAWN AEROBIC AND ANAEROBIC 5CC  Final  Culture NO GROWTH 3 DAYS  Final   Report Status PENDING  Incomplete  MRSA PCR Screening     Status: None   Collection Time: 09/23/14  5:59 PM  Result Value Ref Range Status   MRSA by PCR NEGATIVE NEGATIVE Final    Comment:        The GeneXpert MRSA Assay (FDA approved for NASAL specimens only), is one component of a comprehensive MRSA colonization surveillance program. It is not intended to diagnose MRSA infection nor to guide or monitor treatment for MRSA infections.    Medications: I have reviewed the patient's current medications. Scheduled Meds: . aspirin EC  81 mg Oral Daily  . docusate sodium  100 mg Oral Daily  . enoxaparin (LOVENOX) injection  30 mg Subcutaneous Q24H  . folic acid  1 mg Oral Daily  . pantoprazole  40 mg Oral QAC breakfast   . simvastatin  20 mg Oral q1800  . sodium chloride  10-40 mL Intracatheter Q12H  . sodium chloride  3 mL Intravenous Q12H  . vancomycin  1,000 mg Intravenous Q24H  . vitamin B-12  1,000 mcg Oral Daily   Continuous Infusions: . sodium chloride 10 mL/hr at 09/27/14 0036   PRN Meds:.acetaminophen, alum & mag hydroxide-simeth, guaiFENesin-dextromethorphan, magnesium sulfate 1 - 4 g bolus IVPB, metoprolol, morphine injection, ondansetron, oxyCODONE-acetaminophen, phenol, potassium chloride, sodium chloride   Assessment/Plan: [name] is a 81 y.o. female w/ PMHx of chronic sCHF (EF 25-30%), CAD, HTN, and HLD, admitted for hypotension.   Hypotension: Resolved.  -Continue to HOLD BP medications.  -If elevated, can restart Norvasc 2.5 mg daily.   Left Great Toe Wound: Has been a chronic issue, related to vascular disease. Now s/p fem-pop bypass.  -Continue to manage on outpatient basis s/p revascularization  PVD: Now s/p left fem-pop bypass.  -Management per [facility]  Macrocytic Anemia: Likely 2/2 B12 deficiency. Hb 8.5 this AM. -Continue B12 -F/u CBC in clinic  HTN: As above -Hold home medications. Can possibly resume Norvasc 2.5 mg daily if needed and titrate as necessary.  HLD: Stable.  -Continue Zocor  CAD: Significant LCx disease. Follows w/ Dr. [place]il[place]. Denies chest pain. -Continue ASA + Zocor  DVT/PE PPx: Lovenox [place]  Dispo:  Anticipated discharge 1-2 days s/p right fem-pop bypass. D/c to SNF.  The patient does have a current PCP (Peter M [place]Jordan, MD) and [facility]  The patient does not have transportation limitations that hinder transportation to clinic appointments.  .Services Needed at time of discharge: Y = Yes, Blank = No PT:   OT:   RN:   Equipment:   Other:     LOS: 4 days   Courtney ParisEden W Makinsey Pepitone, MD 09/27/2014, 11:01 AM

## 2014-09-27 NOTE — Progress Notes (Signed)
Occupational Therapy Evaluation  Pt admitted with the below listed deficits.  She was limited by Lt LE pain during eval.  Currently, she requires mod - max a for ADLs at bed level.  She will benefit from continued OT to address deficit areas, and maximize independence with ADLs.  Recommend SNF level rehab at discharge.    09/27/14 1300  OT Visit Information  Last OT Received On 09/27/14  Assistance Needed +2  PT/OT/SLP Co-Evaluation/Treatment Yes  Reason for Co-Treatment For patient/therapist safety;Complexity of the patient's impairments (multi-system involvement)  History of Present Illness 81 y.o. female with CAD, systolic CHF, severe MR, HTN, and HLD. She went to her MD 09-23-14 for preop workup and was found to be hypotension. She was therefore admitted. Underwent fem-pop 09-26-14 due to severe PVD and L great toe ulcer.  Precautions  Precautions Fall  Home Living  Family/patient expects to be discharged to: Skilled nursing facility  Living Arrangements Other relatives  Available Help at Discharge Family;Available PRN/intermittently  Type of Home Apartment  Home Access Level entry  Home Layout Two level;Able to live on main level with bedroom/bathroom  Bathroom Shower/Tub (shower upstairs)  Bathroom Accessibility No  Home Equipment Walker - 2 wheels;BSC;Hospital bed  Additional Comments Lives with nephew who have converted the living room into a bedroom.  Uses BSC.  Niece assists her intermittently with ADLs   Prior Function  Level of Independence Needs assistance  Gait / Transfers Assistance Needed mod I - supervision with RW   ADL's / Homemaking Assistance Needed assist with bathing and also with dressing at times  Communication  Communication No difficulties  Pain Assessment  Pain Assessment Faces  Faces Pain Scale 6  Pain Location Lt LE   Pain Descriptors / Indicators Aching;Constant;Grimacing;Guarding  Pain Intervention(s) Limited activity within patient's  tolerance;Monitored during session;Repositioned  Cognition  Arousal/Alertness Awake/alert  Behavior During Therapy WFL for tasks assessed/performed  Overall Cognitive Status Within Functional Limits for tasks assessed  Upper Extremity Assessment  Upper Extremity Assessment LUE deficits/detail;Generalized weakness  LUE Deficits / Details Pt reports h/o CVA with residual deficits Lt UE.  She has very minimal shoulder flexion actively.  Hand to mouth WFL.  Swan neck deformities digits with hyperextension of MCPs  Lower Extremity Assessment  Lower Extremity Assessment Defer to PT evaluation  ADL  Overall ADL's  Needs assistance/impaired  Eating/Feeding Independent;Bed level  Grooming Wash/dry hands;Wash/dry face;Oral care;Brushing hair;Set up;Bed level  Upper Body Bathing Minimal assitance;Sitting  Lower Body Bathing Maximal assistance;Bed level  Upper Body Dressing  Minimal assistance;Sitting  Lower Body Dressing Total assistance;Bed level  Toilet Transfer Total assistance  Toilet Transfer Details (indicate cue type and reason) unable due to pain   Toileting- Clothing Manipulation and Hygiene Total assistance;Bed level  Functional mobility during ADLs Total assistance  General ADL Comments unable due to pain   Bed Mobility  Overal bed mobility Needs Assistance  Bed Mobility Supine to Sit;Sit to Supine  Supine to sit Max assist;+2 for physical assistance  Sit to supine Total assist;+2 for physical assistance  General bed mobility comments once fairly upright, pt scooted asymmetrically to EOB  Transfers  General transfer comment unable due to pain   Balance  Overall balance assessment Needs assistance  Sitting-balance support No upper extremity supported  Sitting balance-Leahy Scale Fair  General Comments  General comments (skin integrity, edema, etc.) Pt limited by pain   OT - End of Session  Activity Tolerance Patient limited by pain  Patient left in bed;with  call bell/phone  within reach  Nurse Communication Mobility status  OT Assessment  OT Therapy Diagnosis  Generalized weakness;Acute pain  OT Recommendation/Assessment Patient needs continued OT Services  OT Problem List Decreased strength;Decreased activity tolerance;Impaired balance (sitting and/or standing);Decreased safety awareness;Decreased knowledge of use of DME or AE;Decreased knowledge of precautions;Pain  Barriers to Discharge Decreased caregiver support  OT Plan  OT Frequency (ACUTE ONLY) Min 2X/week  OT Treatment/Interventions (ACUTE ONLY) Self-care/ADL training;DME and/or AE instruction;Therapeutic activities;Patient/family education;Balance training  OT Recommendation  Follow Up Recommendations SNF  OT Equipment None recommended by OT  Individuals Consulted  Consulted and Agree with Results and Recommendations Patient  Acute Rehab OT Goals  Patient Stated Goal "stop my leg from hurting"  OT Goal Formulation With patient  Time For Goal Achievement 10/11/14  Potential to Achieve Goals Good  OT Time Calculation  OT Start Time (ACUTE ONLY) 1324  OT Stop Time (ACUTE ONLY) 1351  OT Time Calculation (min) 27 min  OT General Charges  $OT Visit 1 Procedure  OT Evaluation  $Initial OT Evaluation Tier I 1 Procedure  Written Expression  Dominant Hand Right  Jeani HawkingWendi Marice Angelino, OTR/L [phone]

## 2014-09-27 NOTE — Progress Notes (Signed)
Patient seen and examined. Case d/w residents in detail. I agree with findings and plan as documented in Dr. Yetta BarreJones' note.   Patient is now s/p fem pop bypass. Vascular recommendations appreciated. c/w pain control. Hypotension has now resolved. Continue to hold anti hypertensives. Likely d/c to SNF once ok with vascular

## 2014-09-27 NOTE — Progress Notes (Signed)
Physical Therapy Treatment Patient Details Name: Karina Willis MRN: [id number] DOB: June 18, 1932 Today's Date: 09/27/2014    History of Present Illness 81 y.o. female with CAD, systolic CHF, severe MR, HTN, and HLD. She went to her MD 09-23-14 for preop workup and was found to be hypotension. She was therefore admitted. She is schedule for fem-pop 09-26-14 due to severe PVD and L great toe ulcer.    PT Comments    Pain significantly limited mobility  Follow Up Recommendations  SNF;Supervision/Assistance - 24 hour     Equipment Recommendations  None recommended by PT    Recommendations for Other Services       Precautions / Restrictions Precautions Precautions: Fall Restrictions Weight Bearing Restrictions: No    Mobility  Bed Mobility Overal bed mobility: Needs Assistance Bed Mobility: Supine to Sit;Sit to Supine     Supine to sit: Max assist;+2 for physical assistance Sit to supine: Total assist;+2 for physical assistance   General bed mobility comments: once fairly upright, pt scooted asymmetrically to EOB  Transfers Overall transfer level: Needs assistance               General transfer comment: pt deferred standing due to pain  Ambulation/Gait                 Stairs            Wheelchair Mobility    Modified Rankin (Stroke Patients Only)       Balance Overall balance assessment: Needs assistance Sitting-balance support: No upper extremity supported;Single extremity supported Sitting balance-Leahy Scale: Fair                              Cognition Arousal/Alertness: Awake/alert Behavior During Therapy: WFL for tasks assessed/performed Overall Cognitive Status: Within Functional Limits for tasks assessed                      Exercises      General Comments        Pertinent Vitals/Pain Pain Assessment: Faces Faces Pain Scale: Hurts even more Pain Location: L leg Pain Descriptors / Indicators:  Aching;Burning Pain Intervention(s): Limited activity within patient's tolerance;Monitored during session    Home Living Family/patient expects to be discharged to:: Skilled nursing facility Living Arrangements: Other relatives Available Help at Discharge: Family;Available PRN/intermittently Type of Home: Apartment Home Access: Level entry   Home Layout: Two level;Able to live on main level with bedroom/bathroom Home Equipment: Dan Humphreys - 2 wheels;Bedside commode;Hospital bed Additional Comments: Lives with nephew who have converted the living room into a bedroom.  Uses BSC.  Niece assists her intermittently with ADLs     Prior Function Level of Independence: Needs assistance  Gait / Transfers Assistance Needed: mod I - supervision with RW  ADL's / Homemaking Assistance Needed: assist with bathing and also with dressing at times     PT Goals (current goals can now be found in the care plan section) Acute Rehab PT Goals Patient Stated Goal: "stop my leg from hurting" PT Goal Formulation: With patient Time For Goal Achievement: 10/11/14 Potential to Achieve Goals: Good Progress towards PT goals: Progressing toward goals    Frequency  Min 3X/week    PT Plan Current plan remains appropriate    Co-evaluation PT/OT/SLP Co-Evaluation/Treatment: Yes Reason for Co-Treatment: For patient/therapist safety;Complexity of the patient's impairments (multi-system involvement) PT goals addressed during session: Mobility/safety with mobility  End of Session   Activity Tolerance: Patient limited by pain Patient left: in bed;with call bell/phone within reach     Time: 1324-1351 PT Time Calculation (min) (ACUTE ONLY): 27 min  Charges:  $Therapeutic Activity: 8-22 mins                    G Codes:      Corinne Goucher V 09/27/2014, 3:30 PM 7/19/201Eliseo Gum6  Rouse BingKen Eloise Picone, PT [phone]  (pager)

## 2014-09-27 NOTE — Progress Notes (Addendum)
[facility]  Subjective  - No complaints other than some pain from surgery.   Objective 115/55 93 99.2 F (37.3 C) (Oral) 15 97%  Intake/Output Summary (Last 24 hours) at 09/27/14 0711 Last data filed at 09/27/14 [id number]  Gross per 24 hour  Intake   1533 ml  Output   1325 ml  Net    208 ml    Doppler DP/peroneal signals Heart RRR Lungs non labored breathing Dressing intact no active bleeding  Assessment/Planning: POD #1 Left femoral to above-knee popliteal bypass with translocated non-reversed great saphenous vein Encourage mobility PT/OT pending Foley D/C'd this am Disposition stable post op   Clinton GallantCOLLINS, EMMA [facility] 09/27/2014 7:11 AM --  Laboratory Lab Results:  Recent Labs  09/26/14 0437 09/27/14 0445  WBC 7.5 8.8  HGB 9.6* 8.5*  HCT 29.7* 25.8*  PLT 191 165   BMET  Recent Labs  09/26/14 0437 09/27/14 0445  NA 138 134*  K 4.2 4.0  CL 111 107  CO2 23 23  GLUCOSE 87 81  BUN 7 6  CREATININE 0.89 0.77  CALCIUM 8.3* 7.8*    COAG Lab Results  Component Value Date   INR 1.10 09/26/2014   No results found for: PTT    I have examined the patient, reviewed and agree with above.Mobilize, transfer to Charlesetta Shanks2w  Early, Todd, MD 09/27/2014 7:43 AM

## 2014-09-27 NOTE — Care Management Note (Addendum)
Case Management Note  Patient Details  Name: Karina Willis MRN: [id number] Date of Birth: 02/25/1933  Subjective/Objective:                 PTA  from SNF admitted with L foot ischemia / severe arterial insufficiency,  s/p L fem-pop. Bypass graft 09/26/14.   Action/Plan: Return to SNF when medically stable. CM to f/u with d/c needs.  Expected Discharge Date:                  Expected Discharge Plan:  Skilled Nursing Facility ([facility]  In-House Referral:  Clinical Social Work (CSW (Dysheka) is aware)  Discharge planning Services  CM Consult  Post Acute Care Choice:    Choice offered to:     DME Arranged:    DME Agency:     HH Arranged:    HH Agency:     Status of Service:  In process, will continue to follow   Additional Comments: Karina Willis ([address])[phone] ,  Karina Willis (Friend)  [phone]   Gae GallopCole, Aaleigha Bozza [place], RN, [facility] HospitalBSN,CM [phone] 09/27/2014, 10:47 AM

## 2014-09-27 NOTE — Progress Notes (Signed)
VASCULAR LAB PRELIMINARY  ARTERIAL Right BP is significantly lower than the Left.   ABI completed:    RIGHT    LEFT    PRESSURE WAVEFORM  PRESSURE WAVEFORM  BRACHIAL 67 Mono BRACHIAL 125 Tri  DP   DP    AT 120 Bi AT 169 Mono   PT  Not audible PT  Not audible  PER 92 Mono  PER  Not audible  GREAT TOE  NA GREAT TOE  NA    RIGHT LEFT  ABI 0.96 1.35     Farrel DemarkJill Eunice, RDMS, RVT  09/27/2014, 9:29 AM

## 2014-09-27 NOTE — Progress Notes (Addendum)
CSW initiated bed search- [facility]- [facility] they are reviewing.  Anticipated DC on 09/28/14  CSW will continue to follow- full assessment to follow  Merlyn LotJenna Holoman, [facility] Hospital GuamCSWA Clinical Social Worker [phone]

## 2014-09-27 NOTE — Progress Notes (Addendum)
MD returned call. EKG done. MD reviewed strip. No new orders given.Will continue to monitor.  Sandrea HammondJunris Jadia Capers RN

## 2014-09-27 NOTE — Progress Notes (Signed)
09/27/2014 1310 Received a transfer to room 2w29 from 3S.  Pt is A&O, no c/o pain.  Tele monitor applied and CCMD notified.  Oriented to room, call light and bed.  Call bell in reach. Kathryne HitchAllen, [place] Noori C

## 2014-09-28 ENCOUNTER — Telehealth: Payer: Self-pay | Admitting: Cardiology

## 2014-09-28 DIAGNOSIS — I270 Primary pulmonary hypertension: Secondary | ICD-10-CM | POA: Diagnosis not present

## 2014-09-28 DIAGNOSIS — M810 Age-related osteoporosis without current pathological fracture: Secondary | ICD-10-CM | POA: Diagnosis not present

## 2014-09-28 DIAGNOSIS — I509 Heart failure, unspecified: Secondary | ICD-10-CM | POA: Diagnosis not present

## 2014-09-28 DIAGNOSIS — M79652 Pain in left thigh: Secondary | ICD-10-CM | POA: Diagnosis not present

## 2014-09-28 DIAGNOSIS — R5381 Other malaise: Secondary | ICD-10-CM | POA: Diagnosis not present

## 2014-09-28 DIAGNOSIS — I059 Rheumatic mitral valve disease, unspecified: Secondary | ICD-10-CM | POA: Diagnosis not present

## 2014-09-28 DIAGNOSIS — D519 Vitamin B12 deficiency anemia, unspecified: Secondary | ICD-10-CM | POA: Diagnosis not present

## 2014-09-28 DIAGNOSIS — M6281 Muscle weakness (generalized): Secondary | ICD-10-CM | POA: Diagnosis not present

## 2014-09-28 DIAGNOSIS — L03119 Cellulitis of unspecified part of limb: Secondary | ICD-10-CM | POA: Diagnosis not present

## 2014-09-28 DIAGNOSIS — I739 Peripheral vascular disease, unspecified: Secondary | ICD-10-CM | POA: Diagnosis present

## 2014-09-28 DIAGNOSIS — I10 Essential (primary) hypertension: Secondary | ICD-10-CM | POA: Diagnosis not present

## 2014-09-28 DIAGNOSIS — E440 Moderate protein-calorie malnutrition: Secondary | ICD-10-CM | POA: Diagnosis not present

## 2014-09-28 DIAGNOSIS — R262 Difficulty in walking, not elsewhere classified: Secondary | ICD-10-CM | POA: Diagnosis not present

## 2014-09-28 DIAGNOSIS — I5022 Chronic systolic (congestive) heart failure: Secondary | ICD-10-CM | POA: Diagnosis not present

## 2014-09-28 DIAGNOSIS — E780 Pure hypercholesterolemia: Secondary | ICD-10-CM | POA: Diagnosis not present

## 2014-09-28 DIAGNOSIS — I2510 Atherosclerotic heart disease of native coronary artery without angina pectoris: Secondary | ICD-10-CM | POA: Diagnosis not present

## 2014-09-28 DIAGNOSIS — M150 Primary generalized (osteo)arthritis: Secondary | ICD-10-CM | POA: Diagnosis not present

## 2014-09-28 LAB — CULTURE, BLOOD (ROUTINE X 2)
Culture: NO GROWTH
Culture: NO GROWTH

## 2014-09-28 MED ORDER — PHENOL 1.4 % MT LIQD: 1.0000 | OROMUCOSAL | Status: AC | PRN

## 2014-09-28 MED ORDER — BISACODYL 10 MG RE SUPP
10.0000 mg | Freq: Once | RECTAL | Status: AC
  Administered 2014-09-28: 10 mg via RECTAL
  Filled 2014-09-28: qty 1

## 2014-09-28 MED ORDER — POLYETHYLENE GLYCOL 3350 17 G PO PACK
17.0000 g | PACK | Freq: Every day | ORAL | Status: DC
  Administered 2014-09-28: 17 g via ORAL
  Filled 2014-09-28: qty 1

## 2014-09-28 MED ORDER — FOLIC ACID 1 MG PO TABS: 1.0000 mg | ORAL_TABLET | Freq: Every day | ORAL | Status: AC

## 2014-09-28 MED ORDER — POLYETHYLENE GLYCOL 3350 17 G PO PACK: 17.0000 g | PACK | Freq: Every day | ORAL | Status: AC | PRN

## 2014-09-28 MED ORDER — SENNA 8.6 MG PO TABS
1.0000 | ORAL_TABLET | Freq: Once | ORAL | Status: AC
  Administered 2014-09-28: 8.6 mg via ORAL
  Filled 2014-09-28 (×2): qty 1

## 2014-09-28 MED ORDER — OXYCODONE-ACETAMINOPHEN 5-325 MG PO TABS: 1.0000 | ORAL_TABLET | Freq: Four times a day (QID) | ORAL | Status: DC | PRN

## 2014-09-28 MED ORDER — DOCUSATE SODIUM 100 MG PO CAPS: 100.0000 mg | ORAL_CAPSULE | Freq: Two times a day (BID) | ORAL | Status: AC | PRN

## 2014-09-28 MED ORDER — ASPIRIN 81 MG PO TBEC: 81.0000 mg | DELAYED_RELEASE_TABLET | Freq: Every day | ORAL | Status: AC

## 2014-09-28 NOTE — Progress Notes (Signed)
Report called to Belva CromeHeather Newman,RN of [facility] @[phone].

## 2014-09-28 NOTE — Telephone Encounter (Signed)
Spoke with Marylene LandAngela, [facility] health [facility]. They evaluated patient for patient and called to inform of the following plan: 1. They will see patient twice weekly x4 weeks 2. They will see patient once weekly x4 weeks 3. They are seeing patient for medication and medical management and wound care to left great toe  Marylene LandAngela, RN inquired if Dr. SwazilandJordan wanted to set parameters for VS for patient.  Informed Marylene LandAngela, RN that this message will be sent to MD and his nurse to review and advise.  Of note, patient has not seen Dr. SwazilandJordan since Feb. 2013 - last cardiology visit with Wende MottS. Weaver, PA 11/2012

## 2014-09-28 NOTE — Progress Notes (Signed)
Patient seen and examined. Case d/w residents in detail. I agree with findings and plan as documented in Dr. Neita GarnetHoffman's note.  Patient still with mild L LE pain at surgical site but improving. No other complaints. Hypotension has improved. Continue to hold anti hypertensives. Vascular surgery recommendations appreciated. She is stable for d/c to SNF today. Will c/w pain control. [facility].

## 2014-09-28 NOTE — Progress Notes (Signed)
PT Cancellation Note  Patient Details Name: Karina Willis MRN: [id number] DOB: 11/05/1932   Cancelled Treatment:    Reason Eval/Treat Not Completed: Per MD pt to d/c to SNF today. Will continue to follow acutely, and if d/c is delayed will attempt again tomorrow.    Conni SlipperKirkman, Geronimo Diliberto 09/28/2014, 12:19 PM   Conni SlipperLaura Chloe Miyoshi, PT, DPT Acute Rehabilitation Services Pager: [phone]

## 2014-09-28 NOTE — Progress Notes (Signed)
[facility] Anticipated discharge date:09/28/14 Family notified: pt niece Transportation by [employer]- called at 4:20pm  CSW signing off.  Merlyn LotJenna Holoman, LCSWA Clinical Social Worker [phone]

## 2014-09-28 NOTE — Progress Notes (Signed)
Subjective: Patient reports some pain in her left leg when she moves it or sits up otherwise no complaints.  BP 87/64 this AM.  Objective: Vital signs in last 24 hours: Filed Vitals:   09/27/14 1935 09/27/14 2015 09/27/14 2039 09/28/14 0233  BP: 108/50 107/50  87/64  Pulse: 91 93  103  Temp: 98.3 F (36.8 C)   98.6 F (37 C)  TempSrc: Oral   Oral  Resp: Height:      Weight:      SpO2: 100%  92% 100%   Weight change:   Intake/Output Summary (Last 24 hours) at 09/28/14 1125 Last data filed at 09/28/14 0003  Gross per 24 hour  Intake    390 ml  Output    350 ml  Net     40 ml   Physical Exam: General: resting in bed in NAD HEENT: Moist mucus membranes Neck: RIJ CVL. Lungs: CTAB Heart: RRR, no murmurs, gallops, or rubs Abdomen: Soft, non-tender, non-distended, BS +.  Extremities:  Left foot elevated Left great toe w/ ulceration surrounding toenail. DP pulse palpable, left inner thigh with surgical wound, mild tenderness to palpation Neurologic: Alert & oriented x3   Lab Results: Basic Metabolic Panel:  Recent Labs Lab 09/24/14 0445 09/25/14 0532 09/26/14 0437 09/27/14 0445  NA 139 138 138 134*  K 3.2* 3.8 4.2 4.0  CL 107 109 111 107  CO2 GLUCOSE 88 86 87 81  BUN CREATININE 1.35* 0.97 0.89 0.77  CALCIUM 8.1* 8.0* 8.3* 7.8*  MG 1.7 1.9  --   --    Liver Function Tests:  Recent Labs Lab 09/23/14 1410 09/26/14 0437  AST 14* 13*  ALT 10* 7*  ALKPHOS 46 39  BILITOT 0.4 0.5  PROT 6.4* 5.3*  ALBUMIN 2.6* 2.0*   CBC:  Recent Labs Lab 09/26/14 0437 09/27/14 0445  WBC 7.5 8.8  HGB 9.6* 8.5*  HCT 29.7* 25.8*  MCV 99.0 99.2  PLT 191 165   Urine Drug Screen: Drugs of Abuse     Component Value Date/Time   LABOPIA NONE DETECTED 08/02/2013 0926   COCAINSCRNUR NONE DETECTED 08/02/2013 0926   LABBENZ NONE DETECTED 08/02/2013 0926   AMPHETMU NONE DETECTED 08/02/2013 0926   THCU NONE DETECTED 08/02/2013 0926   LABBARB NONE DETECTED 08/02/2013 0926    Urinalysis:  Recent Labs Lab 09/23/14 1730  COLORURINE YELLOW  LABSPEC 1.008  PHURINE 5.5  GLUCOSEU NEGATIVE  HGBUR NEGATIVE  BILIRUBINUR NEGATIVE  KETONESUR NEGATIVE  PROTEINUR NEGATIVE  UROBILINOGEN 0.2  NITRITE NEGATIVE  LEUKOCYTESUR NEGATIVE    Micro Results: Recent Results (from the past 240 hour(s))  Blood culture (routine x 2)     Status: None (Preliminary result)   Collection Time: 09/23/14  2:00 PM  Result Value Ref Range Status   Specimen Description BLOOD LEFT ANTECUBITAL  Final   Special Requests BOTTLES DRAWN AEROBIC AND ANAEROBIC 5CC  Final   Culture NO GROWTH 4 DAYS  Final   Report Status PENDING  Incomplete  Blood culture (routine x 2)     Status: None (Preliminary result)   Collection Time: 09/23/14  2:15 PM  Result Value Ref Range Status   Specimen Description BLOOD RIGHT HAND  Final   Special Requests BOTTLES DRAWN AEROBIC AND ANAEROBIC 5CC  Final   Culture NO GROWTH 4 DAYS  Final   Report Status PENDING  Incomplete  MRSA PCR Screening  Status: None   Collection Time: 09/23/14  5:59 PM  Result Value Ref Range Status   MRSA by PCR NEGATIVE NEGATIVE Final    Comment:        The GeneXpert MRSA Assay (FDA approved for NASAL specimens only), is one component of a comprehensive MRSA colonization surveillance program. It is not intended to diagnose MRSA infection nor to guide or monitor treatment for MRSA infections.    Medications: I have reviewed the patient's current medications. Scheduled Meds: . aspirin EC  81 mg Oral Daily  . bisacodyl  10 mg Rectal Once  . docusate sodium  100 mg Oral Daily  . enoxaparin (LOVENOX) injection  30 mg Subcutaneous Q24H  . folic acid  1 mg Oral Daily  . pantoprazole  40 mg Oral QAC breakfast  . polyethylene glycol  17 g Oral Daily  . simvastatin  20 mg Oral q1800  . sodium chloride  10-40 mL Intracatheter Q12H  . sodium chloride  3 mL Intravenous Q12H  . vitamin  B-12  1,000 mcg Oral Daily   Continuous Infusions: . sodium chloride 10 mL/hr at 09/27/14 0036   PRN Meds:.acetaminophen, alum & mag hydroxide-simeth, guaiFENesin-dextromethorphan, magnesium sulfate 1 - 4 g bolus IVPB, metoprolol, morphine injection, ondansetron, oxyCODONE-acetaminophen, phenol, potassium chloride, sodium chloride   Assessment/Plan: Ms. Lajean Silviusorma J Yan is a 81 y.o. female w/ PMHx of chronic sCHF (EF 25-30%), CAD, HTN, and HLD, admitted for hypotension.   Hypotension: weak BP this AM at 87/64, patient still mentating appropriately -Continue to HOLD BP medications.  -  Recheck BP this afternoon, if wnl can discharge to SNF today.  Left Great Toe Wound: Has been a chronic issue, related to vascular disease. Now s/p fem-pop bypass.  -Continue to manage on outpatient basis s/p revascularization  PVD: Now s/p left fem-pop bypass.  -Management per VVS  Macrocytic Anemia: Likely 2/2 B12 deficiency. Hb 8.5 this AM. -Continue B12 -F/u CBC in clinic  HTN: As above -Hold home medications. Medications can be slowly reintroduced as needed at SNF  HLD: Stable.  -Continue Zocor  CAD: Significant LCx disease. Follows w/ Dr. SwazilandJordan. Denies chest pain. -Continue ASA + Zocor  DVT/PE PPx: Lovenox [place]  Dispo:  D/C to SNF today if BP recheck normal.  The patient does have a current PCP (Peter M SwazilandJordan, MD) and does [facility] after discharge.  The patient does not have transportation limitations that hinder transportation to clinic appointments.  .Services Needed at time of discharge: Y = Yes, Blank = No PT:   OT:   RN:   Equipment:   Other:     LOS: 5 days   Gust RungErik C Hoffman, DO 09/28/2014, 11:25 AM

## 2014-09-28 NOTE — Care Management Important Message (Signed)
Important Message  Patient Details  Name: Karina Willis MRN: [id number] Date of Birth: 05/17/1932   Medicare Important Message Given:  Yes-third notification given    Kyla BalzarineShealy, Tafari Humiston Abena 09/28/2014, 11:01 AMImportant Message  Patient Details  Name: Karina Willis MRN: [id number] Date of Birth: 02/04/1933   Medicare Important Message Given:  Yes-third notification given    Kyla BalzarineShealy, Indy [place] Abena 09/28/2014, 11:01 AM

## 2014-09-28 NOTE — Discharge Summary (Signed)
Name: Karina Willis MRN: [id number] DOB: 11-26-1932 81 y.o. PCP: Peter M Swaziland, MD  Date of Admission: 09/23/2014 12:29 PM Date of Discharge: 09/28/2014 Attending Physician: Aletta Edouard, MD  Discharge Diagnosis: Principal Problem:   Severe peripheral arterial disease Active Problems:   B12 deficiency anemia   B12 deficiency   Hypotension   Pressure ulcer  Discharge Medications:   Medication List    STOP taking these medications        amLODipine 10 MG tablet  Commonly known as:  NORVASC     aspirin 325 MG tablet  Replaced by:  aspirin 81 MG EC tablet     cephALEXin 500 MG capsule  Commonly known as:  KEFLEX     furosemide 40 MG tablet  Commonly known as:  LASIX     metoprolol 50 MG tablet  Commonly known as:  LOPRESSOR     oxyCODONE 5 MG immediate release tablet  Commonly known as:  Oxy IR/ROXICODONE     ramipril 10 MG capsule  Commonly known as:  ALTACE     spironolactone 25 MG tablet  Commonly known as:  ALDACTONE      TAKE these medications        acetaminophen 325 MG tablet  Commonly known as:  TYLENOL  Take 2 tablets (650 mg total) by mouth every 6 (six) hours as needed for moderate pain.     aspirin 81 MG EC tablet  Take 1 tablet (81 mg total) by mouth daily.     aspirin-acetaminophen-caffeine 250-250-65 MG per tablet  Commonly known as:  EXCEDRIN MIGRAINE  Take 1 tablet by mouth every 6 (six) hours as needed for headache.     cyanocobalamin 1000 MCG tablet  Take 1 tablet (1,000 mcg total) by mouth daily.     docusate sodium 100 MG capsule  Commonly known as:  COLACE  Take 1 capsule (100 mg total) by mouth 2 (two) times daily as needed for mild constipation.     folic acid 1 MG tablet  Commonly known as:  FOLVITE  Take 1 tablet (1 mg total) by mouth daily.     hydrocerin Crea  Apply 1 application topically 2 (two) times daily.     nitroGLYCERIN 0.4 MG SL tablet  Commonly known as:  NITROSTAT  Place 1 tablet (0.4 mg total) under  the tongue every 5 (five) minutes as needed for chest pain. Must keep appointment 10/31/14 with Dr Swaziland     oxyCODONE-acetaminophen 5-325 MG per tablet  Commonly known as:  PERCOCET/ROXICET  Take 1 tablet by mouth every 6 (six) hours as needed for moderate pain.     phenol 1.4 % Liqd  Commonly known as:  CHLORASEPTIC  Use as directed 1 spray in the mouth or throat as needed for throat irritation / pain.     polyethylene glycol packet  Commonly known as:  MIRALAX / GLYCOLAX  Take 17 g by mouth daily as needed.     simvastatin 20 MG tablet  Commonly known as:  ZOCOR  Take 1 tablet (20 mg total) by mouth daily at 6 PM.        Disposition and follow-up:   Ms.Karina Willis was discharged from [facility] in Stable condition.  At the hospital follow up visit please address:  1.  Blood pressure - please check BP and start medications as needed. We have held HTN medications (Lasix, metoprolol, Ramipril, spironolactone, and Norvasc) at discharge due to low pressures. 2.  PAD - please monitor left great toe wound and ensure [name] follows up with Dr. Arbie CookeyEarly with vascular surgery 2wks following discharge 3. Anemia - please check CBC. If she is not taking daily Vit B12 supplement, could consider injections 4. Onchomycosis, chronic - please help facilitate a podiatry appointment      4.  Labs / imaging needed at time of follow-up: CBC  5.  Pending labs/ test needing follow-up: None  Follow-up Appointments: Follow-up Information    Follow up with Evelena PeatWilson, Alex, DO. Go on 10/03/2014.   Specialty:  Internal Medicine   Why:  Internal Medicine Clinic, 2:15PM   Contact information:   [address] [place] [address] [phone]       Follow up with Gretta BeganEarly, Todd, MD In 2 weeks.   Specialties:  Vascular Surgery, Cardiology   Why:  sent message to office   Contact information:   [address] [place] [address] [phone]       Discharge Instructions: Discharge  Instructions    Call MD for:  severe uncontrolled pain    Complete by:  As directed      Diet - low sodium heart healthy    Complete by:  As directed      Increase activity slowly    Complete by:  As directed            Consultations: Treatment Team:  Larina Earthlyodd F Early, MD  Procedures Performed:  1. Procedure:  Ultrasound-guided RIJ central venous catheter placement. Indications:  Need for intravenous access and hemodynamic monitoring. Consent:  Consent obtained from patient.   2. PROCEDURE: Left femoral to above-knee popliteal bypass with translocated non-reversed great saphenous vein PRE-OPERATIVE DIAGNOSIS: Rest pain and early gangrenous changes left foot with severe arterial insufficiency POST-OPERATIVE DIAGNOSIS:  Same SURGEON:  Gretta Beganodd Early, M.D.   Dg Chest Port 1 View  09/23/2014   CLINICAL DATA:  Central line placement.  Shortness of breath  EXAM: PORTABLE CHEST - 1 VIEW  COMPARISON:  09/23/2014  FINDINGS: There is a right IJ catheter with tip in the cavoatrial junction. The heart size appears normal. There is no pleural effusion or edema. No airspace consolidation.  IMPRESSION: 1. No acute cardiopulmonary abnormalities.   Electronically Signed   By: Signa Kellaylor  Stroud M.D.   On: 09/23/2014 21:07   Dg Chest Port 1 View  09/23/2014   CLINICAL DATA:  Shortness of breath. Hypoxia. Congestive heart failure. Coronary artery disease.  EXAM: PORTABLE CHEST - 1 VIEW  COMPARISON:  08/14/2014  FINDINGS: Low lung volumes are noted. Mild cardiomegaly stable. Both lungs are clear. No evidence of pneumothorax or pleural effusion.  IMPRESSION: Mild cardiomegaly and low lung volumes.  No acute findings.   Electronically Signed   By: Myles RosenthalJohn  Stahl M.D.   On: 09/23/2014 15:43     Admission HPI: [name] is a 81 y.o. female w/ PMHx of chronic sCHF (EF 25-30%), CAD, HTN, and HLD, presents to the ED w/ complaints of dizziness. Patient states she was at pre-op appointment (for upcoming left fem-pop  bypass) and has some dizziness and lightheadedness. BP checked at that time found to be in the 60-70's systolic and patient was taken to the ED. Of note, patient was admitted to the hospital in 08/2014 w/ similar issues involving hypotension at which time her BP medications (Norvasc, Spironolactone, Metoprolol, and Ramipril) her held on discharge until she followed up w/ her [facility] PCP [facility]), however, this information was not appropriately  communicated to her (according to the patient) and she did not make it to her follow up appointment and continued to take all of her BP medications.Patient denies fever, chills, cough, worsening SOB, nausea, vomiting, abdominal pain. Patient has a known left great toe infection related to PVD and had arteriogram on 09/21/14 which showed occlusion of the superficial femoral artery. Patient is scheduled for a fem-pop bypass on 09/26/14.   Hospital Course by problem list: Principal Problem:   Severe peripheral arterial disease Active Problems:   B12 deficiency anemia   B12 deficiency   Hypotension   Pressure ulcer  Left great toe wound 2/2 peripheral artery disease, s/p left femoral-popliteal bypass (7/18) Ms. Depaz presented to the ED from vascular surgery pre-op clinic with plans for a left lower extremity femoral to above-knee popliteal bypass for limb salvage on Monday 7/18. Ms. Bluestone said that her toe was very painful and greatly limited her mobility. We initially held her home oxycodone to avoid lowering her BP further and prescribed prn Tylenol with moderate effect. On 7/17, BP was stable in the 100s and her toe continued to be painful so we added her home dose of prn oxycodone. Her hemoglobin trended down to 7.7 from baseline of ~10 and she was transfused with 1u RBC the day prior to surgery. Ms. Schmader was taken to the OR for the fem-pop bypass on 7/18 with Dr. Arbie Cookey. The procedure went well with no complications. PT evaluated the patient and due to limited  mobility from pain and deconditioning recommended SNF placement at discharge. On day of discharge, she was eating solids, had adequate analgesia on po medications, and was maintaining systolics >100.  Hypotension Ms. Canaday presented with hypotension (systolics in 70s) and lightheadedness likely due to overmedication with HTN medications and dehydration. All BP medications were held. Access was difficult to obtain, requiring an ultrasound-guided R IJ central venous catheter. She required several fluid boluses and was placed on IV NS initially at 154mL/hr. BP the following morning improved but continued to be soft with systolics in the 80s-90s. She received a bolus of fluids with an adequate response, and IVF were decreased to 18mL/hr. Her BP remained stable in the low 100s-110s.   B12 deficiency anemia: Has been noncompliant with Oral B12.  We have resumed her on oral B12 replacement.  If remains low may need parenteral injections.   Discharge Vitals:   BP 113/65 mmHg  Pulse 90  Temp(Src) 98.5 F (36.9 C) (Oral)  Resp 18  Ht 4\' 11"  (1.499 m)  Wt 132 lb 7.9 oz (60.1 kg)  BMI 26.75 kg/m2  SpO2 100%  Discharge Labs:  No results found for this or any previous visit (from the past 24 hour(s)).  Signed: Gust Rung, DO 09/28/2014, 4:08 PM    Services Ordered on Discharge: none Equipment Ordered on Discharge: none

## 2014-09-28 NOTE — Progress Notes (Signed)
Subjective: POD#2 s/p left fem-pop bypass. No acute events overnight. She did have a 6 beat run of asymptomatic Vtach with a normal ECG. She is doing well post-op. She had no complaints but did endorse pain of her left leg when touched or moved during exam. She received 1 perocet prn @ 11:16am and 2 morphine prns at 2:36am and 7:36am. Of note, she reports she has not had a BM for ~5 days.  Objective: Vital signs in last 24 hours: Filed Vitals:   09/27/14 2015 09/27/14 2039 09/28/14 0233 09/28/14 1140  BP: 107/50  87/64 113/65  Pulse: 93  103 90  Temp:   98.6 F (37 C) 98.5 F (36.9 C)  TempSrc:   Oral Oral  Resp: Height:      Weight:      SpO2:  92% 100% 100%   Weight change:   Intake/Output Summary (Last 24 hours) at 09/28/14 1200 Last data filed at 09/28/14 0003  Gross per 24 hour  Intake    390 ml  Output    350 ml  Net     40 ml    Physical Exam: General: Alert, cooperative, NAD. HEENT: EOMI. Moist mucus membranes Neck: Full range of motion without pain Lungs: Clear to ascultation bilaterally, normal work of respiration, no wheezes, rales, rhonchi Heart: RRR, no murmurs, gallops, or rubs Abdomen: Soft, non-tender, non-distended, BS + Extremities: No cyanosis, clubbing, or edema. LLE mildly swollen with ulceration of left great toenail bed and all nails with severe onchomycosis. Weak pulses. LLE pain with palpation and movement. Neurologic: Alert & oriented X3, cranial nerves II-XII grossly intact, strength grossly intact, sensation intact to light touch  Lab Results:  CBC:  Recent Labs Lab 09/23/14 1410 09/24/14 0445 09/25/14 0532 09/26/14 0437 09/27/14 0445  WBC 10.7* 7.7 6.7 7.5 8.8  HGB 9.8* 8.5* 7.7* 9.6* 8.5*  HCT 29.8* 25.9* 24.0* 29.7* 25.8*  MCV 102.1* 103.6* 101.7* 99.0 99.2  PLT 228 208 212 191 165    Basic Metabolic Panel:  Recent Labs Lab 09/23/14 1410 09/24/14 0445 09/25/14 0532 09/26/14 0437 09/27/14 0445  NA 139 139 138  138 134*  K 3.9 3.2* 3.8 4.2 4.0  CL 106 107 109 111 107  CO2 GLUCOSE 108* 88 86 87 81  BUN CREATININE 1.59* 1.35* 0.97 0.89 0.77  CALCIUM 8.8* 8.1* 8.0* 8.3* 7.8*  MG  --  1.7 1.9  --   --    Micro Results: Recent Results (from the past 240 hour(s))  Blood culture (routine x 2)     Status: None (Preliminary result)   Collection Time: 09/23/14  2:00 PM  Result Value Ref Range Status   Specimen Description BLOOD LEFT ANTECUBITAL  Final   Special Requests BOTTLES DRAWN AEROBIC AND ANAEROBIC 5CC  Final   Culture NO GROWTH 4 DAYS  Final   Report Status PENDING  Incomplete  Blood culture (routine x 2)     Status: None (Preliminary result)   Collection Time: 09/23/14  2:15 PM  Result Value Ref Range Status   Specimen Description BLOOD RIGHT HAND  Final   Special Requests BOTTLES DRAWN AEROBIC AND ANAEROBIC 5CC  Final   Culture NO GROWTH 4 DAYS  Final   Report Status PENDING  Incomplete  MRSA PCR Screening     Status: None   Collection Time: 09/23/14  5:59 PM  Result Value Ref Range Status  MRSA by PCR NEGATIVE NEGATIVE Final    Comment:        The GeneXpert MRSA Assay (FDA approved for NASAL specimens only), is one component of a comprehensive MRSA colonization surveillance program. It is not intended to diagnose MRSA infection nor to guide or monitor treatment for MRSA infections.    Medications: I have reviewed the patient's current medications. Scheduled Meds: . aspirin EC  81 mg Oral Daily  . bisacodyl  10 mg Rectal Once  . docusate sodium  100 mg Oral Daily  . enoxaparin (LOVENOX) injection  30 mg Subcutaneous Q24H  . folic acid  1 mg Oral Daily  . pantoprazole  40 mg Oral QAC breakfast  . polyethylene glycol  17 g Oral Daily  . simvastatin  20 mg Oral q1800  . sodium chloride  10-40 mL Intracatheter Q12H  . sodium chloride  3 mL Intravenous Q12H  . vitamin B-12  1,000 mcg Oral Daily   Continuous Infusions: . sodium chloride 10  mL/hr at 09/27/14 0036   PRN Meds:.acetaminophen, alum & mag hydroxide-simeth, guaiFENesin-dextromethorphan, magnesium sulfate 1 - 4 g bolus IVPB, metoprolol, morphine injection, ondansetron, oxyCODONE-acetaminophen, phenol, potassium chloride, sodium chloride  Assessment/Plan: Karina Willis is a 81 y.o. yo female with a PMHx of CAD, systolic CHF, HTN, HLD and PAD with left food ischemia who was admitted on 09/23/2014 with hypotension, which was determined to be most likely secondary to overmedication for HTN. Volume depletion may have also contributes. She is now POD2 s/p left femoral above the knee popliteal bypass.  Hypotension- BP 113/65 this AM Explains positional lightheadedness and presyncopal sx likely due to HTN medication overuse.  - Cont to hold home BP meds, consider adding amlodipine 2.5mg  daily if elevated - Cardiac monitoring  PAD w/left great toe wound, s/p femoral-popliteal bypass (7/18) POD2, procedure w/Dr. Arbie CookeyEarly. [facility]VS follow-up in 2 wks.  - PT eval - requires SNF  - Continue prn po tylenol, transition prn IV morphine to percocet 1-2tabs PO prn q4h  - Chloraseptic prn for throat irritation (ET tube) - Robitussin syrup for cough (ET tube)   Constipation  Likely related to   Macrocytic anemia - chronic, baseline Hb ~10 B12 deficiency dx last admission with high MMA, to be [facility]  - Continue Vit B12 1,08500mcg tablet po daily - F/u CBC in clinic  HLD - stable, outpatient Continue home simvastatin 20mg  po daily.  CAD - stable, outpatient.  Follows with Dr. Peter SwazilandJordan (cardiology). Continue aspirin 81mg  daily.  FEN - IV NS @ 14300ml/hr - Replete electrolytes prn - Heart healthy diet  Access: R IJ CVC  DVT ppx: lovenox  Code status: FULL  Disposition: Discharge to SNF today.   This is a Psychologist, occupationalMedical Student Note.  The care of the patient was discussed with Dr. Mikey BussingHoffman and the assessment and plan formulated with their assistance.   Please see their attached note for official documentation of the daily encounter.   LOS: 5 days   Ervin KnackLeah M Dequandre Cordova, Med Student 09/28/2014, 12:00 PM

## 2014-09-28 NOTE — Progress Notes (Addendum)
[facility]  Subjective  - Doing well over all.  Still has complaints of incisional pain.   Objective 87/64 103 98.6 F (37 C) (Oral) 18 100%  Intake/Output Summary (Last 24 hours) at 09/28/14 0831 Last data filed at 09/28/14 0003  Gross per 24 hour  Intake    390 ml  Output    350 ml  Net     40 ml    Doppler DP/peroneal signals Incisions healing well on the thigh, groin incision is moist. Dry clean dressing applied to groin and top of medial thigh incision.  Assessment/Planning: POD #2 Left femoral to above-knee popliteal bypass with translocated non-reversed great saphenous vein  Dry dressing TID to left groin. Wash area with soap and water daily.  The groin must be kept dry to heal the incision.   Mobility encouraged F/U in the office with Dr. Arbie CookeyEarly in 2 weeks after discharge Disposition from vascular point of view is stable.  Clinton GallantCOLLINS, Karina Willis 09/28/2014 8:31 AM --  Laboratory Lab Results:  Recent Labs  09/26/14 0437 09/27/14 0445  WBC 7.5 8.8  HGB 9.6* 8.5*  HCT 29.7* 25.8*  PLT 191 165   BMET  Recent Labs  09/26/14 0437 09/27/14 0445  NA 138 134*  K 4.2 4.0  CL 111 107  CO2 23 23  GLUCOSE 87 81  BUN 7 6  CREATININE 0.89 0.77  CALCIUM 8.3* 7.8*    COAG Lab Results  Component Value Date   INR 1.10 09/26/2014   No results found for: PTT

## 2014-09-28 NOTE — Discharge Instructions (Signed)
We have held all of her prior to admission antihypertensive medications due to hypotension while inpatinet.  Please continue to hold them until she sees her PCP for follow up on 10/03/14. She will follow up with Vascular surgery in 2 weeks.

## 2014-09-28 NOTE — Care Management Note (Signed)
Case Management Note CM started by Gae GallopAngela Cole RNCM  Patient Details  Name: Karina Willis MRN: [id number] Date of Birth: 09/21/1932  Subjective/Objective:                 PTA  from SNF admitted with L foot ischemia / severe arterial insufficiency,  s/p L fem-pop. Bypass graft 09/26/14.   Action/Plan: Return to SNF when medically stable. CM to f/u with d/c needs.  Expected Discharge Date:      09/28/14            Expected Discharge Plan:  Skilled Nursing Facility [facility]  In-House Referral:  Clinical Social Work (CSW (Dysheka) is aware)  Discharge planning Services  CM Consult  Post Acute Care Choice:    Choice offered to:     DME Arranged:    DME Agency:     HH Arranged:    HH Agency:     Status of Service:  Completed, signed off   Additional Comments: Ellenor Kiser Geanie Logan([phone] ,  Pinky PajonalPinky ([phone])  [phone] 09/28/14- [facility]- pt had been active with them PTA- or HH services- plan to discharge to SNF today- [facility] CSW following for d/c needs.   Zenda AlpersWebster, EurekaKristi Hall, RN, Georgiann MohsBSN,CM [phone] 09/28/2014, 4:48 PM

## 2014-09-29 ENCOUNTER — Telehealth: Payer: Self-pay | Admitting: Vascular Surgery

## 2014-09-29 NOTE — Telephone Encounter (Signed)
LM for pt re appt, dpm °

## 2014-09-29 NOTE — Telephone Encounter (Signed)
-----   Message from Sharee Pimple, RN sent at 09/28/2014  9:21 AM EDT ----- Regarding: Schedule   ----- Message -----    From: Lars Mage, PA-C    Sent: 09/28/2014   8:35 AM      To: Vvs Charge Pool  F/U with Dr. Arbie Cookey 2 weeks s/p left fem to above knee popliteal bypass.

## 2014-09-30 NOTE — Telephone Encounter (Signed)
Returned call to [employer] with [facility].Left message on personal voice mail need to call patient's PCP for vital sign parameters.

## 2014-10-03 ENCOUNTER — Ambulatory Visit: Payer: Medicare Other | Admitting: Internal Medicine

## 2014-10-04 DIAGNOSIS — R5381 Other malaise: Secondary | ICD-10-CM | POA: Diagnosis not present

## 2014-10-04 DIAGNOSIS — M79652 Pain in left thigh: Secondary | ICD-10-CM | POA: Diagnosis not present

## 2014-10-04 DIAGNOSIS — M6281 Muscle weakness (generalized): Secondary | ICD-10-CM | POA: Diagnosis not present

## 2014-10-04 DIAGNOSIS — R262 Difficulty in walking, not elsewhere classified: Secondary | ICD-10-CM | POA: Diagnosis not present

## 2014-10-11 DIAGNOSIS — M6281 Muscle weakness (generalized): Secondary | ICD-10-CM | POA: Diagnosis not present

## 2014-10-11 DIAGNOSIS — M79652 Pain in left thigh: Secondary | ICD-10-CM | POA: Diagnosis not present

## 2014-10-17 ENCOUNTER — Encounter: Payer: Self-pay | Admitting: Vascular Surgery

## 2014-10-18 ENCOUNTER — Encounter: Payer: Medicare Other | Admitting: Vascular Surgery

## 2014-10-19 DIAGNOSIS — M79652 Pain in left thigh: Secondary | ICD-10-CM | POA: Diagnosis not present

## 2014-10-19 DIAGNOSIS — M6281 Muscle weakness (generalized): Secondary | ICD-10-CM | POA: Diagnosis not present

## 2014-10-24 ENCOUNTER — Encounter: Payer: Self-pay | Admitting: Vascular Surgery

## 2014-10-24 DIAGNOSIS — M6281 Muscle weakness (generalized): Secondary | ICD-10-CM | POA: Diagnosis not present

## 2014-10-24 DIAGNOSIS — M79652 Pain in left thigh: Secondary | ICD-10-CM | POA: Diagnosis not present

## 2014-10-25 ENCOUNTER — Ambulatory Visit (INDEPENDENT_AMBULATORY_CARE_PROVIDER_SITE_OTHER): Payer: Self-pay | Admitting: Vascular Surgery

## 2014-10-25 ENCOUNTER — Encounter: Payer: Self-pay | Admitting: Vascular Surgery

## 2014-10-25 VITALS — BP 99/61 | HR 88 | Temp 97.2°F | Resp 16 | Ht 59.0 in | Wt 134.0 lb

## 2014-10-25 DIAGNOSIS — I70209 Unspecified atherosclerosis of native arteries of extremities, unspecified extremity: Secondary | ICD-10-CM

## 2014-10-25 DIAGNOSIS — I739 Peripheral vascular disease, unspecified: Secondary | ICD-10-CM

## 2014-10-25 DIAGNOSIS — L98499 Non-pressure chronic ulcer of skin of other sites with unspecified severity: Secondary | ICD-10-CM

## 2014-10-25 NOTE — Progress Notes (Signed)
Here today for follow-up of her recent left femoral to above-knee bypass with saphenous vein on 09/26/2014. She had extensive tissue loss which was a relatively superficial over the toes of her left foot. Arteriogram had shown SFA occlusion with reconstitution of popliteal artery and severe subtotal occlusion of all 3 tibial vessels. She did undergo femoropopliteal bypass to nearly a blind popliteal island. She did well with surgery and is discharged to a nursing facility.  He looks quite good today. She is her usual pleasant animated self. Reports some mild discomfort in her toes and has had resolution of the pain from the bypass surgery.  Her surgical incisions are all healing quite well. She does have healing of her toes. The great toenail is lifting off and she has dry eschars over the other toes which appear to be healing. She has very good peroneal and dorsalis pedis signal at her foot.  Impression and plan successful femoropopliteal bypass with severe tibial disease. Does appear to be having adequate healing of her foot. Explained this will take quite some time. We will see her back in the office in 2 months for continued follow-up

## 2014-10-31 ENCOUNTER — Ambulatory Visit (INDEPENDENT_AMBULATORY_CARE_PROVIDER_SITE_OTHER): Payer: Medicare Other | Admitting: Cardiology

## 2014-10-31 ENCOUNTER — Encounter: Payer: Self-pay | Admitting: Cardiology

## 2014-10-31 VITALS — BP 143/78 | HR 76 | Ht 59.0 in | Wt 134.0 lb

## 2014-10-31 DIAGNOSIS — I059 Rheumatic mitral valve disease, unspecified: Secondary | ICD-10-CM | POA: Diagnosis not present

## 2014-10-31 DIAGNOSIS — E7800 Pure hypercholesterolemia, unspecified: Secondary | ICD-10-CM

## 2014-10-31 DIAGNOSIS — I270 Primary pulmonary hypertension: Secondary | ICD-10-CM

## 2014-10-31 DIAGNOSIS — I272 Pulmonary hypertension, unspecified: Secondary | ICD-10-CM | POA: Insufficient documentation

## 2014-10-31 NOTE — Progress Notes (Signed)
[address], [address] [place], [place]  [address] Phone: [phone] Fax:  [phone]  Date:  10/31/2014   ID:  Deandrea, Vanpelt 1932-04-06, MRN [id number]  PCP:  Peter Swaziland, MD  Cardiologist:  Dr. Peter Swaziland     History of Present Illness: Karina Willis is a 81 y.o. female who returns for f/u CHF and CAD. Hasn't been seen by me in 3 years.     She has a hx of CAD, ischemic cardiomyopathy, chronic systolic CHF, moderate to severe MR, HTN, HL, prior TIA. LHC 3/01: Mid LAD 50%, ostial circumflex 80%, then 99% prior to OM1, then occluded; proximal RCA occluded, EF 35-40% with inferior AK. EF has been reportedly as low as 25-30%.  She has been treated medically.  She presented in May with cellulitis LLE. She developed critical limb ischemia. She was diagnosed with severe PAD and underwent left femoral to above the knee bypass by Dr. Arbie Cookey. Noted to have severe tibial disease as well. Pre op evaluation with Echo showed EF 55-60%, basal septal hypertrophy, diastolic dysfunction, and severe pulmonary HTN with estimated RV pressure of 75 mm Hg. MVP with mild to moderate MR. Since her surgery she has done well. Living at [facility] center. Denies any SOB. Only chest discomfort she has is indigestion at night relieved with Maalox. No foot pain.   Wt Readings from Last 3 Encounters:  10/31/14 60.782 kg (134 lb)  10/25/14 60.782 kg (134 lb)  09/26/14 60.1 kg (132 lb 7.9 oz)     Past Medical History  Diagnosis Date  . CHF (congestive heart failure)   . Mitral insufficiency   . Coronary artery disease 2001    WITH DOCUMENTED TOTAL OCCLUSION OF THE RIGHT CORONARY AND THE LEFT CIRCUMFLEX CORONARY  . SOB (shortness of breath)   . Hypertension   . Hyperlipidemia   . History of TIAs   . Arthritis   . Non compliance w medication regimen   . Neck pain   . PAD (peripheral artery disease)   . Critical lower limb ischemia   . Pulmonary HTN     Current Outpatient Prescriptions    Medication Sig Dispense Refill  . acetaminophen (TYLENOL) 325 MG tablet Take 2 tablets (650 mg total) by mouth every 6 (six) hours as needed for moderate pain.    Marland Kitchen aspirin EC 81 MG EC tablet Take 1 tablet (81 mg total) by mouth daily.    Marland Kitchen aspirin-acetaminophen-caffeine (EXCEDRIN MIGRAINE) 250-250-65 MG per tablet Take 1 tablet by mouth every 6 (six) hours as needed for headache.    Marland Kitchen atorvastatin (LIPITOR) 20 MG tablet Take 20 mg by mouth daily.    Marland Kitchen docusate sodium (COLACE) 100 MG capsule Take 1 capsule (100 mg total) by mouth 2 (two) times daily as needed for mild constipation. 10 capsule 0  . folic acid (FOLVITE) 1 MG tablet Take 1 tablet (1 mg total) by mouth daily.    . furosemide (LASIX) 20 MG tablet Take 20 mg by mouth daily.    . hydrocerin (EUCERIN) CREA Apply 1 application topically 2 (two) times daily. 454 g 3  . nitroGLYCERIN (NITROSTAT) 0.4 MG SL tablet Place 1 tablet (0.4 mg total) under the tongue every 5 (five) minutes as needed for chest pain. Must keep appointment 10/31/14 with Dr Swaziland 25 tablet 5  . oxyCODONE-acetaminophen (PERCOCET/ROXICET) 5-325 MG per tablet Take 1 tablet by mouth every 6 (six) hours as needed for moderate pain. 30 tablet 0  .  phenol (CHLORASEPTIC) 1.4 % LIQD Use as directed 1 spray in the mouth or throat as needed for throat irritation / pain.  0  . polyethylene glycol (MIRALAX / GLYCOLAX) packet Take 17 g by mouth daily as needed. 14 each 0  . Potassium Chloride (KLOR-CON 10 PO) Take 10 mg by mouth daily.    . vitamin B-12 1000 MCG tablet Take 1 tablet (1,000 mcg total) by mouth daily. 30 tablet 3   No current facility-administered medications for this visit.    Allergies:    Allergies  Allergen Reactions  . Penicillins Hives and Itching    Pt reports not being allergic to penicillin Tolerates Ancef    Social History:  The patient  reports that she quit smoking about 31 years ago. Her smoking use included Cigarettes. She has never used  smokeless tobacco. She reports that she does not drink alcohol or use illicit drugs.   ROS:  Please see the history of present illness.   No melena, hematochezia, fevers, cough.   All other systems reviewed and negative.   PHYSICAL EXAM: VS:  BP 143/78 mmHg  Pulse 76  Ht 4\' 11"  (1.499 m)  Wt 60.782 kg (134 lb)  BMI 27.05 kg/m2 Well nourished, well developed, in no acute distress HEENT: normal Neck: no JVD at 90 Cardiac:  normal S1, S2; RRR; 2/6 holosystolic murmur at the LSB Lungs:  clear to auscultation bilaterally, no wheezing, rhonchi or rales Abd: soft, nontender  Ext: 1-2+ edema Skin: warm and dry Neuro:  CNs 2-12 intact, no focal abnormalities noted  EKG:  09/27/14 NSR with old inferior and anterolateral infarcts. I have personally reviewed and interpreted this study.  Echo: 08/15/14:Study Conclusions  - Left ventricle: The cavity size was normal. There was mild focal basal hypertrophy of the septum. Systolic function was normal. The estimated ejection fraction was in the range of 55% to 60%. Doppler parameters are consistent with abnormal left ventricular relaxation (grade 1 diastolic dysfunction). The E/e&' ratio is between 8-15, suggesting indeterminate LV filling pressure. - Ventricular septum: Septal motion showed paradox. The contour showed diastolic flattening. - Aortic valve: Trileaflet. Sclerosis without stenosis. There was no regurgitation. - Mitral valve: Mildly thickened leaflets with borderline, late systolic bileaflet prolapse. There was mild regurgitation. - Right ventricle: The cavity size was moderately dilated. The moderator band was prominent. - Right atrium: The atrium was mildly dilated. - Atrial septum: Mobile IAS - cannot exclude PFO. - Tricuspid valve: There was moderate regurgitation. - Pulmonic valve: Poorly visualized. There was moderate regurgitation. - Pulmonary arteries: PA peak pressure: 75 mm Hg  (S).  Impressions:  - LVEF 55-60%, mild focal basal septal hypertrophy, diastolic dysfunction, indeterminate LV filling pressure, moderately dilated RV with normal systolic function, mildly dilated LA, moderate TR and PR with severe pulmonary hypertension (RVSP 75 mmHG), with an elevated RA pressure of 8 mmHg.  ASSESSMENT AND PLAN:  1. CAD:  Stable. No angina. Continue aspirin and statin. Not a candidate for revascularization based on old cath data.  2. Chronic Systolic CHF:  Recent Echo shows normalization of EF.  Continue current dose of Lasix.   3. Mitral Regurgitation/ mitral valve prolapse:  Degree of MR less than in past.  Volume stable. 4. Hypertension:  Controlled.  5. Hyperlipidemia:  Continue statin. 6. Severe pulmonary HTN- probably related to history of MR and diastolic dysfunction. Minimal right heart failure. Sodium restriction. Continue lasix.  7. PAD with critical limb ischemia s/p left fem-above the knee bypass.  I will follow up in 4 months.  Signed, Peter Swaziland MD, [facility]    10/31/2014 1:13 PM

## 2014-11-01 DIAGNOSIS — I10 Essential (primary) hypertension: Secondary | ICD-10-CM | POA: Diagnosis not present

## 2014-11-01 DIAGNOSIS — I2510 Atherosclerotic heart disease of native coronary artery without angina pectoris: Secondary | ICD-10-CM | POA: Diagnosis not present

## 2014-11-01 DIAGNOSIS — M79652 Pain in left thigh: Secondary | ICD-10-CM | POA: Diagnosis not present

## 2014-11-01 DIAGNOSIS — M810 Age-related osteoporosis without current pathological fracture: Secondary | ICD-10-CM | POA: Diagnosis not present

## 2014-11-01 DIAGNOSIS — I739 Peripheral vascular disease, unspecified: Secondary | ICD-10-CM | POA: Diagnosis not present

## 2014-11-01 DIAGNOSIS — I509 Heart failure, unspecified: Secondary | ICD-10-CM | POA: Diagnosis not present

## 2014-11-01 DIAGNOSIS — M6281 Muscle weakness (generalized): Secondary | ICD-10-CM | POA: Diagnosis not present

## 2014-11-02 DIAGNOSIS — R609 Edema, unspecified: Secondary | ICD-10-CM | POA: Diagnosis not present

## 2014-11-02 DIAGNOSIS — M79605 Pain in left leg: Secondary | ICD-10-CM | POA: Diagnosis not present

## 2014-11-04 DIAGNOSIS — M6281 Muscle weakness (generalized): Secondary | ICD-10-CM | POA: Diagnosis not present

## 2014-11-04 DIAGNOSIS — M79605 Pain in left leg: Secondary | ICD-10-CM | POA: Diagnosis not present

## 2014-11-04 DIAGNOSIS — I82402 Acute embolism and thrombosis of unspecified deep veins of left lower extremity: Secondary | ICD-10-CM | POA: Diagnosis not present

## 2014-11-04 DIAGNOSIS — M7989 Other specified soft tissue disorders: Secondary | ICD-10-CM | POA: Diagnosis not present

## 2014-11-08 DIAGNOSIS — M79652 Pain in left thigh: Secondary | ICD-10-CM | POA: Diagnosis not present

## 2014-11-08 DIAGNOSIS — M6281 Muscle weakness (generalized): Secondary | ICD-10-CM | POA: Diagnosis not present

## 2014-11-15 DIAGNOSIS — I70245 Atherosclerosis of native arteries of left leg with ulceration of other part of foot: Secondary | ICD-10-CM | POA: Diagnosis not present

## 2014-11-17 DIAGNOSIS — Z23 Encounter for immunization: Secondary | ICD-10-CM | POA: Diagnosis not present

## 2014-11-17 DIAGNOSIS — L03119 Cellulitis of unspecified part of limb: Secondary | ICD-10-CM | POA: Diagnosis not present

## 2014-11-22 DIAGNOSIS — I70245 Atherosclerosis of native arteries of left leg with ulceration of other part of foot: Secondary | ICD-10-CM | POA: Diagnosis not present

## 2014-11-29 DIAGNOSIS — I70245 Atherosclerosis of native arteries of left leg with ulceration of other part of foot: Secondary | ICD-10-CM | POA: Diagnosis not present

## 2014-12-03 ENCOUNTER — Other Ambulatory Visit: Payer: Self-pay | Admitting: *Deleted

## 2014-12-03 DIAGNOSIS — I739 Peripheral vascular disease, unspecified: Secondary | ICD-10-CM

## 2014-12-03 DIAGNOSIS — Z48812 Encounter for surgical aftercare following surgery on the circulatory system: Secondary | ICD-10-CM

## 2014-12-06 DIAGNOSIS — I70245 Atherosclerosis of native arteries of left leg with ulceration of other part of foot: Secondary | ICD-10-CM | POA: Diagnosis not present

## 2014-12-13 DIAGNOSIS — I70245 Atherosclerosis of native arteries of left leg with ulceration of other part of foot: Secondary | ICD-10-CM | POA: Diagnosis not present

## 2014-12-19 ENCOUNTER — Inpatient Hospital Stay (INDEPENDENT_AMBULATORY_CARE_PROVIDER_SITE_OTHER)
Admission: RE | Admit: 2014-12-19 | Discharge: 2014-12-19 | Disposition: A | Discharge status: Home / Self Care | Payer: Medicare Other | Source: Ambulatory Visit | Attending: Vascular Surgery | Admitting: Vascular Surgery

## 2014-12-19 ENCOUNTER — Inpatient Hospital Stay (HOSPITAL_COMMUNITY)
Admission: RE | Admit: 2014-12-19 | Discharge: 2014-12-19 | Disposition: A | Discharge status: Home / Self Care | Payer: Medicare Other | Source: Ambulatory Visit | Attending: Vascular Surgery | Admitting: Vascular Surgery

## 2014-12-19 DIAGNOSIS — I739 Peripheral vascular disease, unspecified: Secondary | ICD-10-CM

## 2014-12-19 DIAGNOSIS — Z48812 Encounter for surgical aftercare following surgery on the circulatory system: Secondary | ICD-10-CM

## 2014-12-20 ENCOUNTER — Inpatient Hospital Stay (HOSPITAL_COMMUNITY): Admission: RE | Admit: 2014-12-20 | Payer: Self-pay | Source: Ambulatory Visit

## 2014-12-27 ENCOUNTER — Ambulatory Visit: Payer: Medicare Other | Admitting: Vascular Surgery

## 2014-12-27 DIAGNOSIS — I70245 Atherosclerosis of native arteries of left leg with ulceration of other part of foot: Secondary | ICD-10-CM | POA: Diagnosis not present

## 2015-01-03 DIAGNOSIS — I70245 Atherosclerosis of native arteries of left leg with ulceration of other part of foot: Secondary | ICD-10-CM | POA: Diagnosis not present

## 2015-01-11 DIAGNOSIS — B351 Tinea unguium: Secondary | ICD-10-CM | POA: Diagnosis not present

## 2015-01-17 DIAGNOSIS — M6281 Muscle weakness (generalized): Secondary | ICD-10-CM | POA: Diagnosis not present

## 2015-01-17 DIAGNOSIS — I739 Peripheral vascular disease, unspecified: Secondary | ICD-10-CM | POA: Diagnosis not present

## 2015-01-17 DIAGNOSIS — L03119 Cellulitis of unspecified part of limb: Secondary | ICD-10-CM | POA: Diagnosis not present

## 2015-01-17 DIAGNOSIS — D519 Vitamin B12 deficiency anemia, unspecified: Secondary | ICD-10-CM | POA: Diagnosis not present

## 2015-01-18 DIAGNOSIS — L03119 Cellulitis of unspecified part of limb: Secondary | ICD-10-CM | POA: Diagnosis not present

## 2015-01-18 DIAGNOSIS — I739 Peripheral vascular disease, unspecified: Secondary | ICD-10-CM | POA: Diagnosis not present

## 2015-01-18 DIAGNOSIS — D519 Vitamin B12 deficiency anemia, unspecified: Secondary | ICD-10-CM | POA: Diagnosis not present

## 2015-01-18 DIAGNOSIS — M6281 Muscle weakness (generalized): Secondary | ICD-10-CM | POA: Diagnosis not present

## 2015-01-19 DIAGNOSIS — L03119 Cellulitis of unspecified part of limb: Secondary | ICD-10-CM | POA: Diagnosis not present

## 2015-01-19 DIAGNOSIS — I739 Peripheral vascular disease, unspecified: Secondary | ICD-10-CM | POA: Diagnosis not present

## 2015-01-19 DIAGNOSIS — M6281 Muscle weakness (generalized): Secondary | ICD-10-CM | POA: Diagnosis not present

## 2015-01-19 DIAGNOSIS — D519 Vitamin B12 deficiency anemia, unspecified: Secondary | ICD-10-CM | POA: Diagnosis not present

## 2015-01-20 DIAGNOSIS — D519 Vitamin B12 deficiency anemia, unspecified: Secondary | ICD-10-CM | POA: Diagnosis not present

## 2015-01-20 DIAGNOSIS — M6281 Muscle weakness (generalized): Secondary | ICD-10-CM | POA: Diagnosis not present

## 2015-01-20 DIAGNOSIS — I739 Peripheral vascular disease, unspecified: Secondary | ICD-10-CM | POA: Diagnosis not present

## 2015-01-20 DIAGNOSIS — L03119 Cellulitis of unspecified part of limb: Secondary | ICD-10-CM | POA: Diagnosis not present

## 2015-01-23 DIAGNOSIS — D519 Vitamin B12 deficiency anemia, unspecified: Secondary | ICD-10-CM | POA: Diagnosis not present

## 2015-01-23 DIAGNOSIS — I739 Peripheral vascular disease, unspecified: Secondary | ICD-10-CM | POA: Diagnosis not present

## 2015-01-23 DIAGNOSIS — L03119 Cellulitis of unspecified part of limb: Secondary | ICD-10-CM | POA: Diagnosis not present

## 2015-01-23 DIAGNOSIS — M6281 Muscle weakness (generalized): Secondary | ICD-10-CM | POA: Diagnosis not present

## 2015-01-24 DIAGNOSIS — D519 Vitamin B12 deficiency anemia, unspecified: Secondary | ICD-10-CM | POA: Diagnosis not present

## 2015-01-24 DIAGNOSIS — M6281 Muscle weakness (generalized): Secondary | ICD-10-CM | POA: Diagnosis not present

## 2015-01-24 DIAGNOSIS — L03119 Cellulitis of unspecified part of limb: Secondary | ICD-10-CM | POA: Diagnosis not present

## 2015-01-24 DIAGNOSIS — I739 Peripheral vascular disease, unspecified: Secondary | ICD-10-CM | POA: Diagnosis not present

## 2015-01-26 DIAGNOSIS — D519 Vitamin B12 deficiency anemia, unspecified: Secondary | ICD-10-CM | POA: Diagnosis not present

## 2015-01-26 DIAGNOSIS — L03119 Cellulitis of unspecified part of limb: Secondary | ICD-10-CM | POA: Diagnosis not present

## 2015-01-26 DIAGNOSIS — M6281 Muscle weakness (generalized): Secondary | ICD-10-CM | POA: Diagnosis not present

## 2015-01-26 DIAGNOSIS — I739 Peripheral vascular disease, unspecified: Secondary | ICD-10-CM | POA: Diagnosis not present

## 2015-01-27 DIAGNOSIS — I739 Peripheral vascular disease, unspecified: Secondary | ICD-10-CM | POA: Diagnosis not present

## 2015-01-27 DIAGNOSIS — M6281 Muscle weakness (generalized): Secondary | ICD-10-CM | POA: Diagnosis not present

## 2015-01-27 DIAGNOSIS — L03119 Cellulitis of unspecified part of limb: Secondary | ICD-10-CM | POA: Diagnosis not present

## 2015-01-27 DIAGNOSIS — D519 Vitamin B12 deficiency anemia, unspecified: Secondary | ICD-10-CM | POA: Diagnosis not present

## 2015-01-28 DIAGNOSIS — I739 Peripheral vascular disease, unspecified: Secondary | ICD-10-CM | POA: Diagnosis not present

## 2015-01-28 DIAGNOSIS — D519 Vitamin B12 deficiency anemia, unspecified: Secondary | ICD-10-CM | POA: Diagnosis not present

## 2015-01-28 DIAGNOSIS — L03119 Cellulitis of unspecified part of limb: Secondary | ICD-10-CM | POA: Diagnosis not present

## 2015-01-28 DIAGNOSIS — M6281 Muscle weakness (generalized): Secondary | ICD-10-CM | POA: Diagnosis not present

## 2015-01-29 DIAGNOSIS — M6281 Muscle weakness (generalized): Secondary | ICD-10-CM | POA: Diagnosis not present

## 2015-01-29 DIAGNOSIS — L03119 Cellulitis of unspecified part of limb: Secondary | ICD-10-CM | POA: Diagnosis not present

## 2015-01-29 DIAGNOSIS — D519 Vitamin B12 deficiency anemia, unspecified: Secondary | ICD-10-CM | POA: Diagnosis not present

## 2015-01-29 DIAGNOSIS — I739 Peripheral vascular disease, unspecified: Secondary | ICD-10-CM | POA: Diagnosis not present

## 2015-02-13 ENCOUNTER — Ambulatory Visit: Payer: Medicare Other | Admitting: Cardiology

## 2015-02-13 DIAGNOSIS — R0989 Other specified symptoms and signs involving the circulatory and respiratory systems: Secondary | ICD-10-CM

## 2015-02-14 ENCOUNTER — Other Ambulatory Visit (HOSPITAL_COMMUNITY): Payer: Self-pay

## 2015-02-14 ENCOUNTER — Ambulatory Visit: Payer: Medicare Other | Admitting: Vascular Surgery

## 2015-02-14 ENCOUNTER — Encounter (HOSPITAL_COMMUNITY): Payer: Self-pay

## 2015-02-17 DIAGNOSIS — R5383 Other fatigue: Secondary | ICD-10-CM | POA: Diagnosis not present

## 2015-02-20 DIAGNOSIS — R5381 Other malaise: Secondary | ICD-10-CM | POA: Diagnosis not present

## 2015-02-20 DIAGNOSIS — D649 Anemia, unspecified: Secondary | ICD-10-CM | POA: Diagnosis not present

## 2015-02-20 DIAGNOSIS — D62 Acute posthemorrhagic anemia: Secondary | ICD-10-CM | POA: Diagnosis not present

## 2015-02-20 DIAGNOSIS — R5383 Other fatigue: Secondary | ICD-10-CM | POA: Diagnosis not present

## 2015-02-21 ENCOUNTER — Inpatient Hospital Stay (HOSPITAL_COMMUNITY)
Admission: EM | Admit: 2015-02-21 | Discharge: 2015-02-23 | DRG: 811 | Disposition: A | Discharge status: Skilled Nursing Facility | Payer: Medicare Other | Attending: Family Medicine | Admitting: Family Medicine

## 2015-02-21 ENCOUNTER — Encounter (HOSPITAL_COMMUNITY): Payer: Self-pay

## 2015-02-21 ENCOUNTER — Emergency Department (HOSPITAL_COMMUNITY): Payer: Medicare Other

## 2015-02-21 DIAGNOSIS — M1990 Unspecified osteoarthritis, unspecified site: Secondary | ICD-10-CM | POA: Diagnosis present

## 2015-02-21 DIAGNOSIS — I255 Ischemic cardiomyopathy: Secondary | ICD-10-CM | POA: Diagnosis present

## 2015-02-21 DIAGNOSIS — Z8673 Personal history of transient ischemic attack (TIA), and cerebral infarction without residual deficits: Secondary | ICD-10-CM

## 2015-02-21 DIAGNOSIS — Z8249 Family history of ischemic heart disease and other diseases of the circulatory system: Secondary | ICD-10-CM | POA: Diagnosis not present

## 2015-02-21 DIAGNOSIS — I509 Heart failure, unspecified: Secondary | ICD-10-CM | POA: Insufficient documentation

## 2015-02-21 DIAGNOSIS — I5031 Acute diastolic (congestive) heart failure: Secondary | ICD-10-CM | POA: Diagnosis not present

## 2015-02-21 DIAGNOSIS — R7989 Other specified abnormal findings of blood chemistry: Secondary | ICD-10-CM | POA: Diagnosis not present

## 2015-02-21 DIAGNOSIS — I25110 Atherosclerotic heart disease of native coronary artery with unstable angina pectoris: Secondary | ICD-10-CM | POA: Diagnosis not present

## 2015-02-21 DIAGNOSIS — I130 Hypertensive heart and chronic kidney disease with heart failure and stage 1 through stage 4 chronic kidney disease, or unspecified chronic kidney disease: Secondary | ICD-10-CM | POA: Diagnosis present

## 2015-02-21 DIAGNOSIS — Z7901 Long term (current) use of anticoagulants: Secondary | ICD-10-CM

## 2015-02-21 DIAGNOSIS — N179 Acute kidney failure, unspecified: Secondary | ICD-10-CM | POA: Diagnosis not present

## 2015-02-21 DIAGNOSIS — Z7982 Long term (current) use of aspirin: Secondary | ICD-10-CM

## 2015-02-21 DIAGNOSIS — Z79899 Other long term (current) drug therapy: Secondary | ICD-10-CM

## 2015-02-21 DIAGNOSIS — I270 Primary pulmonary hypertension: Secondary | ICD-10-CM | POA: Diagnosis present

## 2015-02-21 DIAGNOSIS — R079 Chest pain, unspecified: Secondary | ICD-10-CM | POA: Insufficient documentation

## 2015-02-21 DIAGNOSIS — I739 Peripheral vascular disease, unspecified: Secondary | ICD-10-CM | POA: Diagnosis not present

## 2015-02-21 DIAGNOSIS — F419 Anxiety disorder, unspecified: Secondary | ICD-10-CM | POA: Diagnosis present

## 2015-02-21 DIAGNOSIS — E7800 Pure hypercholesterolemia, unspecified: Secondary | ICD-10-CM | POA: Diagnosis not present

## 2015-02-21 DIAGNOSIS — Z87891 Personal history of nicotine dependence: Secondary | ICD-10-CM

## 2015-02-21 DIAGNOSIS — D500 Iron deficiency anemia secondary to blood loss (chronic): Secondary | ICD-10-CM | POA: Diagnosis not present

## 2015-02-21 DIAGNOSIS — I272 Other secondary pulmonary hypertension: Secondary | ICD-10-CM | POA: Diagnosis not present

## 2015-02-21 DIAGNOSIS — E785 Hyperlipidemia, unspecified: Secondary | ICD-10-CM | POA: Diagnosis not present

## 2015-02-21 DIAGNOSIS — Z9114 Patient's other noncompliance with medication regimen: Secondary | ICD-10-CM

## 2015-02-21 DIAGNOSIS — J90 Pleural effusion, not elsewhere classified: Secondary | ICD-10-CM | POA: Diagnosis not present

## 2015-02-21 DIAGNOSIS — D649 Anemia, unspecified: Secondary | ICD-10-CM | POA: Diagnosis not present

## 2015-02-21 DIAGNOSIS — I959 Hypotension, unspecified: Secondary | ICD-10-CM | POA: Diagnosis not present

## 2015-02-21 DIAGNOSIS — N183 Chronic kidney disease, stage 3 (moderate): Secondary | ICD-10-CM | POA: Diagnosis not present

## 2015-02-21 DIAGNOSIS — R0602 Shortness of breath: Secondary | ICD-10-CM | POA: Diagnosis not present

## 2015-02-21 DIAGNOSIS — I2510 Atherosclerotic heart disease of native coronary artery without angina pectoris: Secondary | ICD-10-CM | POA: Diagnosis present

## 2015-02-21 DIAGNOSIS — R778 Other specified abnormalities of plasma proteins: Secondary | ICD-10-CM

## 2015-02-21 DIAGNOSIS — I82409 Acute embolism and thrombosis of unspecified deep veins of unspecified lower extremity: Secondary | ICD-10-CM | POA: Diagnosis not present

## 2015-02-21 LAB — CBC
HCT: 26.6 % — ABNORMAL LOW (ref 36.0–46.0)
HEMOGLOBIN: 8 g/dL — AB (ref 12.0–15.0)
MCH: 25.6 pg — AB (ref 26.0–34.0)
MCHC: 30.1 g/dL (ref 30.0–36.0)
MCV: 85 fL (ref 78.0–100.0)
Platelets: 308 10*3/uL (ref 150–400)
RBC: 3.13 MIL/uL — ABNORMAL LOW (ref 3.87–5.11)
RDW: 17.1 % — ABNORMAL HIGH (ref 11.5–15.5)
WBC: 8.3 10*3/uL (ref 4.0–10.5)

## 2015-02-21 LAB — CBC WITH DIFFERENTIAL/PLATELET
BASOS PCT: 0 %
Basophils Absolute: 0 10*3/uL (ref 0.0–0.1)
EOS ABS: 0.1 10*3/uL (ref 0.0–0.7)
EOS PCT: 1 %
HCT: 20.5 % — ABNORMAL LOW (ref 36.0–46.0)
HEMOGLOBIN: 6.1 g/dL — AB (ref 12.0–15.0)
Lymphocytes Relative: 22 %
Lymphs Abs: 1.5 10*3/uL (ref 0.7–4.0)
MCH: 26.2 pg (ref 26.0–34.0)
MCHC: 29.8 g/dL — AB (ref 30.0–36.0)
MCV: 88 fL (ref 78.0–100.0)
MONOS PCT: 13 %
Monocytes Absolute: 0.9 10*3/uL (ref 0.1–1.0)
NEUTROS PCT: 64 %
Neutro Abs: 4.5 10*3/uL (ref 1.7–7.7)
PLATELETS: 328 10*3/uL (ref 150–400)
RBC: 2.33 MIL/uL — ABNORMAL LOW (ref 3.87–5.11)
RDW: 16.5 % — AB (ref 11.5–15.5)
WBC: 7.1 10*3/uL (ref 4.0–10.5)

## 2015-02-21 LAB — BASIC METABOLIC PANEL
Anion gap: 7 (ref 5–15)
BUN: 23 mg/dL — ABNORMAL HIGH (ref 6–20)
CALCIUM: 8.6 mg/dL — AB (ref 8.9–10.3)
CO2: 25 mmol/L (ref 22–32)
CREATININE: 1.29 mg/dL — AB (ref 0.44–1.00)
Chloride: 106 mmol/L (ref 101–111)
GFR calc non Af Amer: 38 mL/min — ABNORMAL LOW (ref >60)
GFR, EST AFRICAN AMERICAN: 44 mL/min — AB (ref >60)
Glucose, Bld: 122 mg/dL — ABNORMAL HIGH (ref 65–99)
Potassium: 4.8 mmol/L (ref 3.5–5.1)
SODIUM: 138 mmol/L (ref 135–145)

## 2015-02-21 LAB — PREPARE RBC (CROSSMATCH)

## 2015-02-21 LAB — MRSA PCR SCREENING: MRSA BY PCR: NEGATIVE

## 2015-02-21 LAB — PROTIME-INR
INR: 4.07 — AB (ref 0.00–1.49)
PROTHROMBIN TIME: 38.5 s — AB (ref 11.6–15.2)

## 2015-02-21 MED ORDER — SODIUM CHLORIDE 0.9 % IV SOLN: 10.0000 mL/h | Freq: Once | INTRAVENOUS | Status: AC

## 2015-02-21 MED ORDER — FOLIC ACID 1 MG PO TABS
1.0000 mg | ORAL_TABLET | Freq: Every day | ORAL | Status: DC
  Administered 2015-02-22 – 2015-02-23 (×2): 1 mg via ORAL
  Filled 2015-02-21 (×2): qty 1

## 2015-02-21 MED ORDER — SODIUM CHLORIDE 0.9 % IJ SOLN: 3.0000 mL | INTRAMUSCULAR | Status: DC | PRN

## 2015-02-21 MED ORDER — SODIUM CHLORIDE 0.9 % IV SOLN
Freq: Once | INTRAVENOUS | Status: AC
  Administered 2015-02-21: 23:00:00 via INTRAVENOUS

## 2015-02-21 MED ORDER — ATORVASTATIN CALCIUM 20 MG PO TABS
20.0000 mg | ORAL_TABLET | Freq: Every day | ORAL | Status: DC
  Administered 2015-02-21 – 2015-02-22 (×2): 20 mg via ORAL
  Filled 2015-02-21 (×2): qty 1

## 2015-02-21 MED ORDER — SODIUM CHLORIDE 0.9 % IV SOLN: 250.0000 mL | INTRAVENOUS | Status: DC | PRN

## 2015-02-21 MED ORDER — VITAMIN B-12 1000 MCG PO TABS: 1000.0000 ug | ORAL_TABLET | Freq: Every day | ORAL | Status: DC

## 2015-02-21 MED ORDER — SODIUM CHLORIDE 0.9 % IJ SOLN
3.0000 mL | Freq: Two times a day (BID) | INTRAMUSCULAR | Status: DC
  Administered 2015-02-21 – 2015-02-22 (×3): 3 mL via INTRAVENOUS

## 2015-02-21 MED ORDER — ACETAMINOPHEN 325 MG PO TABS
650.0000 mg | ORAL_TABLET | Freq: Four times a day (QID) | ORAL | Status: DC | PRN
  Administered 2015-02-21 – 2015-02-23 (×5): 650 mg via ORAL
  Filled 2015-02-21 (×5): qty 2

## 2015-02-21 MED ORDER — SODIUM CHLORIDE 0.9 % IV SOLN
10.0000 mL/h | Freq: Once | INTRAVENOUS | Status: AC
  Administered 2015-02-21: 10 mL/h via INTRAVENOUS

## 2015-02-21 MED ORDER — VITAMIN B-12 1000 MCG PO TABS
1000.0000 ug | ORAL_TABLET | Freq: Every day | ORAL | Status: DC
  Administered 2015-02-22 – 2015-02-23 (×2): 1000 ug via ORAL
  Filled 2015-02-21 (×2): qty 1

## 2015-02-21 MED ORDER — SENNOSIDES-DOCUSATE SODIUM 8.6-50 MG PO TABS: 1.0000 | ORAL_TABLET | Freq: Every evening | ORAL | Status: DC | PRN

## 2015-02-21 MED ORDER — HYDROCERIN EX CREA
1.0000 "application " | TOPICAL_CREAM | Freq: Two times a day (BID) | CUTANEOUS | Status: DC
  Administered 2015-02-21 – 2015-02-23 (×4): 1 via TOPICAL
  Filled 2015-02-21: qty 113

## 2015-02-21 NOTE — ED Notes (Signed)
Per [facility]. Hgb 6.3. Recently had dental surgery to remove teeth and was bleeding from site a few days ago. Pt pale, a&o x 4, denies pain/shob/n/v.

## 2015-02-21 NOTE — ED Provider Notes (Signed)
CSN: [id number]     Arrival date & time 02/21/15  1316 History   First MD Initiated Contact with Patient 02/21/15 1318     Chief Complaint  Patient presents with  . Abnormal Lab     The history is provided by the patient. No language interpreter was used.   Karina Willis is a 81 y.o. female who presents to the Emergency Department complaining of abnormal lab. Hx is provided by EMS report and the patient.  She is here for abnormal lab value of Hgb 6.3 on blood draw yesterday.  She reports increased SOB for the last several days.  She has increased BLE for an unclear duration of time.  She had a dental extraction several days ago and had bleeding for a "while" after that, now stopped.  She takes aspirin and xarelto.  Sxs are moderate, constant, worsening.    Past Medical History  Diagnosis Date  . CHF (congestive heart failure) (HCC)   . Mitral insufficiency   . Coronary artery disease 2001    WITH DOCUMENTED TOTAL OCCLUSION OF THE RIGHT CORONARY AND THE LEFT CIRCUMFLEX CORONARY  . SOB (shortness of breath)   . Hypertension   . Hyperlipidemia   . History of TIAs   . Arthritis   . Non compliance w medication regimen   . Neck pain   . PAD (peripheral artery disease) (HCC)   . Critical lower limb ischemia   . Pulmonary HTN (HCC)    Past Surgical History  Procedure Laterality Date  . Cardiac catheterization  06/05/1999    ENLARGED LEFT VENTRICULAR SIZE. THERE IS AKINESIA OF THE INFERIOR BASE. LEFT VENTRICULAR  FUNCTIONS APPEAR TO BE MODERATELY REDUCED WITH EF 35-40%  . Appendectomy    . Left arm surgery    . Peripheral vascular catheterization Left 09/21/2014    Procedure: Lower Extremity Angiography;  Surgeon: Larina Earthly, MD;  Location: [facility] INVASIVE CV LAB;  Service: Cardiovascular;  Laterality: Left;  . Femoral-popliteal bypass graft Left 09/26/2014    Procedure: LEFT FEMORAL-ABOVE THE KNEE POPLITEAL ARTERY BYPASS GRAFT USING NON REVERSE LEFT GREATER SAPHENOUS VEIN. ;  Surgeon: Larina Earthly, MD;  Location: [facility] OR;  Service: Vascular;  Laterality: Left;   Family History  Problem Relation Age of Onset  . Heart attack Mother   . Heart attack Father   . Heart disease Sister   . Heart attack Brother   . Heart attack Brother   . Heart attack Brother   . Heart disease Sister    Social History  Substance Use Topics  . Smoking status: Former Smoker    Types: Cigarettes    Quit date: 08/21/1983  . Smokeless tobacco: Never Used  . Alcohol Use: No   OB History    No data available     Review of Systems  All other systems reviewed and are negative.     Allergies  Penicillins  Home Medications   Prior to Admission medications   Medication Sig Start Date End Date Taking? Authorizing Provider  acetaminophen (TYLENOL) 325 MG tablet Take 2 tablets (650 mg total) by mouth every 6 (six) hours as needed for moderate pain. 08/04/13   Nani Ravens, MD  aspirin EC 81 MG EC tablet Take 1 tablet (81 mg total) by mouth daily. 09/28/14   Gust Rung, DO  aspirin-acetaminophen-caffeine (EXCEDRIN MIGRAINE) [phone] MG per tablet Take 1 tablet by mouth every 6 (six) hours as needed for headache.    Historical  Provider, MD  atorvastatin (LIPITOR) 20 MG tablet Take 20 mg by mouth daily.    Historical Provider, MD  docusate sodium (COLACE) 100 MG capsule Take 1 capsule (100 mg total) by mouth 2 (two) times daily as needed for mild constipation. 09/28/14   Gust RungErik C Hoffman, DO  folic acid (FOLVITE) 1 MG tablet Take 1 tablet (1 mg total) by mouth daily. 09/28/14   Gust RungErik C Hoffman, DO  furosemide (LASIX) 20 MG tablet Take 20 mg by mouth daily.    Historical Provider, MD  hydrocerin (EUCERIN) CREA Apply 1 application topically 2 (two) times daily. 08/16/14   Tasrif Ahmed, MD  nitroGLYCERIN (NITROSTAT) 0.4 MG SL tablet Place 1 tablet (0.4 mg total) under the tongue every 5 (five) minutes as needed for chest pain. Must keep appointment 10/31/14 with Dr SwazilandJordan 09/16/14   Peter M SwazilandJordan, MD   oxyCODONE-acetaminophen (PERCOCET/ROXICET) 5-325 MG per tablet Take 1 tablet by mouth every 6 (six) hours as needed for moderate pain. 09/28/14   Gust RungErik C Hoffman, DO  phenol (CHLORASEPTIC) 1.4 % LIQD Use as directed 1 spray in the mouth or throat as needed for throat irritation / pain. 09/28/14   Gust RungErik C Hoffman, DO  polyethylene glycol (MIRALAX / GLYCOLAX) packet Take 17 g by mouth daily as needed. 09/28/14   Gust RungErik C Hoffman, DO  Potassium Chloride (KLOR-CON 10 PO) Take 10 mg by mouth daily.    Historical Provider, MD  vitamin B-12 1000 MCG tablet Take 1 tablet (1,000 mcg total) by mouth daily. 08/16/14   Tasrif Ahmed, MD   BP 95/70 mmHg  Pulse 90  Temp(Src) 97.8 F (36.6 C) (Oral)  Resp 21  SpO2 100% Physical Exam  Constitutional: She is oriented to person, place, and time. She appears well-developed and well-nourished.  HENT:  Head: Normocephalic and atraumatic.  Poor dentition, no active bleeding  Cardiovascular: Normal rate and regular rhythm.   No murmur heard. Pulmonary/Chest: Effort normal and breath sounds normal. No respiratory distress.  Abdominal: Soft. There is no tenderness. There is no rebound and no guarding.  Musculoskeletal: She exhibits no tenderness.  2-3+ BLE edema  Neurological: She is alert and oriented to person, place, and time.  Skin: Skin is warm and dry.  Psychiatric: She has a normal mood and affect. Her behavior is normal.  Nursing note and vitals reviewed.   ED Course  Procedures (including critical care time) Labs Review Labs Reviewed  BASIC METABOLIC PANEL - Abnormal; Notable for the following:    Glucose, Bld 122 (*)    BUN 23 (*)    Creatinine, Ser 1.29 (*)    Calcium 8.6 (*)    GFR calc non Af Amer 38 (*)    GFR calc Af Amer 44 (*)    All other components within normal limits  CBC WITH DIFFERENTIAL/PLATELET - Abnormal; Notable for the following:    RBC 2.33 (*)    Hemoglobin 6.1 (*)    HCT 20.5 (*)    MCHC 29.8 (*)    RDW 16.5 (*)    All  other components within normal limits  PROTIME-INR - Abnormal; Notable for the following:    Prothrombin Time 38.5 (*)    INR 4.07 (*)    All other components within normal limits  TYPE AND SCREEN  PREPARE RBC (CROSSMATCH)  PREPARE RBC (CROSSMATCH)    Imaging Review Dg Chest 2 View  02/21/2015  CLINICAL DATA:  Shortness of breath for 3 days EXAM: CHEST - 2 VIEW  COMPARISON:  09/23/2014 FINDINGS: Cardiac shadow is mildly enlarged. The right jugular line is been removed in the interval. The right lung remains clear. Some slight increased density is noted projecting in the left lung base consistent with small effusion. Small posterior right-sided effusion is noted as well. Chronic degenerative changes of the shoulder joints are noted bilaterally. IMPRESSION: Bilateral pleural effusions left greater than right. Electronically Signed   By: Alcide Clever M.D.   On: 02/21/2015 14:35   I have personally reviewed and evaluated these images and lab results as part of my medical decision-making.   EKG Interpretation   Date/Time:  Tuesday February 21 2015 14:08:39 EST Ventricular Rate:  84 PR Interval:  144 QRS Duration: 60 QT Interval:  429 QTC Calculation: 507 R Axis:   83 Text Interpretation:  Sinus rhythm Borderline right axis deviation Low  voltage, extremity and precordial leads Nonspecific T abnormalities,  lateral leads Prolonged QT interval Confirmed by Lincoln Brigham [phone]) on  02/21/2015 6:28:31 PM      MDM   Final diagnoses:  Symptomatic anemia    Pt referred for evaluation of low hemoglobin on blood draw yesterday.  She is symptomatic with increased SOB, has hx/o CHF with increased BLE edema.  No active bleeding in the ED.  Hgb low today at 6.1, will transfuse. Discussed with family medicine service, will admit for further treatment and management.  Discussed with patient need for transfusion and admission and she is in agreement with plan.      Tilden Fossa, MD 02/21/15  [phone]

## 2015-02-21 NOTE — ED Notes (Signed)
IV team at bedside 

## 2015-02-21 NOTE — H&P (Signed)
Family Medicine Teaching [facility] Admission History and Physical Service Pager: [phone]  Patient name: Karina Willis Medical record number: [id number] Date of birth: [birth year] Age: 81 y.o. Gender: female  Primary Care Provider: Peter Swaziland, MD Consultants: None  Code Status: FULL  Chief Complaint: Symptomatic Anemia   Assessment and Plan: AYVA VEILLEUX is a 81 y.o. female presenting with symptomatic anemia with HgB of 6.1 in setting of a recent dental extraction with subsequent bleeding. PMH is significant for CHF, CAD, HTN, HLD and hx of TIAs.   Symptomatic Anemia: Patient presenting with HgB of 6.1 with  light headness. Had recent dental extraction with bleeding.  Baseline HgB approx 10. Patient has hx of macrocytic anemia with B12 deficieny, for which she takes supplements. Patient is not currently bleeding in her mouth. She denies blood in stools. Patient does take ASA and Xarelto daily. INR 4.07 at admission. She is s/p transfusion with 1u PRBC. Nursing home not able to explain why patient is on Xarelto. Not able to reach niece for information. -Admit to MedSurg; vitals per unit  -Continue B12 and folic acid supplementation  -hold Xarelto and ASA  -FOBT ordered  -will transfuse with additional 1u PRBC  -repeat H&H s/p transfusion   -HgB goal of 8.0 given her CAD  Chronic HFpEF:  Last echo (6/16): EF 55-60%, mild MR with mitral valve prolapse, pulm HTN and Grade I Diastolic Dysfunction. Also with moderate pulmonic and tricuspid regurgitation and elevated PA pressure. Possible PFO also seen on echo. Patient appears to be somewhat volume overloaded on exam; however, she is not in any respiratory distress.  -hold off on diuresis given low BPs and current anemia  -consider repeat echo   Hypotension: BPs 90s/60s. Likely related to anemia.  -transfuse with additional 1u PRBC  -monitor BPs   AKI: Baseline 0.9. Cr 1.29 at admission.  -monitor BMETs   CAD:  Denies chest pain.  Follows with Dr. Swaziland.  -continue Lipitor  -holding ASA in light of recent bleeding & anemia   PVD: S/p left fem-pop bypass.   FEN/GI: SLIV, Soft diet  Prophylaxis: SCDs   Disposition: Admit to FPTS; Attending Jennette Kettle   History of Present Illness:  Karina Willis is a 81 y.o. female presenting with HgB of 6.1. She was sent from  [facility] for low hemoglobin on blood draw yesterday. Patient had a recent dental procedure on the right side of her mouth and after this procedure had bleeding for about 3 days.  Her bleeding stopped two days ago. No history of blood clots. She sees Dr. Peter Swaziland for cardiovascular issues. She is unsure of why she is on Xarelto. She reports no chest pain, nausea, vomiting or abdominal pain. She does report swelling in her legs for the past week and some dyspnea. She does not smoke anymore. She has some lightheadedness with standing. 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   Tish Men, niece [phone]  In the ED, she received 1 u pRBC.   Review Of Systems: Per HPI with the following additions: None  Otherwise the remainder of the systems were negative.  Patient Active Problem List   Diagnosis Date Noted  . Pulmonary HTN (HCC) 10/31/2014  . Severe peripheral arterial disease (HCC) 09/28/2014  . Pressure ulcer 09/23/2014  . B12 deficiency 08/15/2014  . Moderate malnutrition (HCC) 08/15/2014  . Prediabetes 08/15/2014  . Cellulitis of great toe of left foot 08/15/2014  . Hypotension 08/15/2014  .  AKI (acute kidney injury) (HCC) 08/14/2014  . Hypovolemia 08/14/2014  . Anemia 08/03/2013  . Bilateral hip pain 08/01/2013  . Fall at home 08/01/2013  . Chronic diastolic CHF (congestive heart failure) (HCC)   . Mitral insufficiency   . Coronary artery disease   . SOB (shortness of breath)   . History of TIAs   . Arthritis   . B12 deficiency anemia 04/24/2009  . UNSPECIFIED VITAMIN D DEFICIENCY 06/13/2008  . Chronic kidney  disease (CKD), stage III (moderate) 06/13/2008  . POSTMENOPAUSAL STATUS 06/03/2008  . KNEE PAIN, LEFT 12/03/2007  . HYPERCHOLESTEROLEMIA 05/08/2006  . OBESITY, NOS 05/08/2006  . HYPERTENSION, BENIGN SYSTEMIC 05/08/2006  . COR PULMONALE 05/08/2006  . Mitral valve disorder 05/08/2006  . OSTEOARTHRITIS, MULTI SITES 05/08/2006    Past Medical History: Past Medical History  Diagnosis Date  . CHF (congestive heart failure) (HCC)   . Mitral insufficiency   . Coronary artery disease 2001    WITH DOCUMENTED TOTAL OCCLUSION OF THE RIGHT CORONARY AND THE LEFT CIRCUMFLEX CORONARY  . SOB (shortness of breath)   . Hypertension   . Hyperlipidemia   . History of TIAs   . Arthritis   . Non compliance w medication regimen   . Neck pain   . PAD (peripheral artery disease) (HCC)   . Critical lower limb ischemia   . Pulmonary HTN (HCC)     Past Surgical History: Past Surgical History  Procedure Laterality Date  . Cardiac catheterization  06/05/1999    ENLARGED LEFT VENTRICULAR SIZE. THERE IS AKINESIA OF THE INFERIOR BASE. LEFT VENTRICULAR  FUNCTIONS APPEAR TO BE MODERATELY REDUCED WITH EF 35-40%  . Appendectomy    . Left arm surgery    . Peripheral vascular catheterization Left 09/21/2014    Procedure: Lower Extremity Angiography;  Surgeon: Larina Earthlyodd F Early, MD;  [facility];  Service: Cardiovascular;  Laterality: Left;  . Femoral-popliteal bypass graft Left 09/26/2014    Procedure: LEFT FEMORAL-ABOVE THE KNEE POPLITEAL ARTERY BYPASS GRAFT USING NON REVERSE LEFT GREATER SAPHENOUS VEIN. ;  Surgeon: Larina Earthlyodd F Early, MD;  [facility];  Service: Vascular;  Laterality: Left;    Social History: Social History  Substance Use Topics  . Smoking status: Former Smoker    Types: Cigarettes    Quit date: 08/21/1983  . Smokeless tobacco: Never Used  . Alcohol Use: No   Additional social history: None   Please also refer to relevant sections of EMR.  Family History: Family History   Problem Relation Age of Onset  . Heart attack Mother   . Heart attack Father   . Heart disease Sister   . Heart attack Brother   . Heart attack Brother   . Heart attack Brother   . Heart disease Sister     Allergies and Medications: Allergies  Allergen Reactions  . Penicillins Hives and Itching    Pt reports not being allergic to penicillin Tolerates Ancef   No current facility-administered medications on file prior to encounter.   Current Outpatient Prescriptions on File Prior to Encounter  Medication Sig Dispense Refill  . acetaminophen (TYLENOL) 325 MG tablet Take 2 tablets (650 mg total) by mouth every 6 (six) hours as needed for moderate pain.    Marland Kitchen. aspirin EC 81 MG EC tablet Take 1 tablet (81 mg total) by mouth daily.    Marland Kitchen. aspirin-acetaminophen-caffeine (EXCEDRIN MIGRAINE) 250-250-65 MG per tablet Take 1 tablet by mouth every 6 (six) hours as needed for headache.    .Marland Kitchen  atorvastatin (LIPITOR) 20 MG tablet Take 20 mg by mouth daily.    Marland Kitchen docusate sodium (COLACE) 100 MG capsule Take 1 capsule (100 mg total) by mouth 2 (two) times daily as needed for mild constipation. 10 capsule 0  . folic acid (FOLVITE) 1 MG tablet Take 1 tablet (1 mg total) by mouth daily.    . furosemide (LASIX) 20 MG tablet Take 20 mg by mouth daily.    . hydrocerin (EUCERIN) CREA Apply 1 application topically 2 (two) times daily. 454 g 3  . nitroGLYCERIN (NITROSTAT) 0.4 MG SL tablet Place 1 tablet (0.4 mg total) under the tongue every 5 (five) minutes as needed for chest pain. Must keep appointment 10/31/14 with Dr Swaziland 25 tablet 5  . oxyCODONE-acetaminophen (PERCOCET/ROXICET) 5-325 MG per tablet Take 1 tablet by mouth every 6 (six) hours as needed for moderate pain. 30 tablet 0  . phenol (CHLORASEPTIC) 1.4 % LIQD Use as directed 1 spray in the mouth or throat as needed for throat irritation / pain.  0  . polyethylene glycol (MIRALAX / GLYCOLAX) packet Take 17 g by mouth daily as needed. 14 each 0  .  Potassium Chloride (KLOR-CON 10 PO) Take 10 mg by mouth daily.    . vitamin B-12 1000 MCG tablet Take 1 tablet (1,000 mcg total) by mouth daily. 30 tablet 3    Objective: BP 94/69 mmHg  Pulse 82  Temp(Src) 98.5 F (36.9 C) (Oral)  Resp 20  SpO2 100% Exam: General: Elderly female lying in bed in NAD  Eyes: Conjunctival pallor. PERRL. EOMI.  ENTM: Poor dentition. No active bleeding. Oropharynx clear.  Neck: Full ROM.  Cardiovascular: RRR. 3/6 murmur appreciated. 1+ pedal pulses. Respiratory: Crackles in bases L > R. Diminished air movement. No respiratory distress Abdomen: +BS, soft, NTND MSK: Moves all extremities spontaneously. 2-3+ pitting bilateral LE edema to hips.  Skin: Warm and dry. Chronic left great toe wound. No heel ulcers.  Neuro: Alert and oriented to person and place. Not oriented to time.  Psych: At times answers questions appropriately, at other times answers are non-sensical. Patient appears confused at points during history.   Labs and Imaging: CBC BMET   Recent Labs Lab 02/21/15 1353  WBC 7.1  HGB 6.1*  HCT 20.5*  PLT 328    Recent Labs Lab 02/21/15 1353  NA 138  K 4.8  CL 106  CO2 25  BUN 23*  CREATININE 1.29*  GLUCOSE 122*  CALCIUM 8.6*     INR 4.07    Dg Chest 2 View  02/21/2015  CLINICAL DATA:  Shortness of breath for 3 days EXAM: CHEST - 2 VIEW COMPARISON:  09/23/2014 FINDINGS: Cardiac shadow is mildly enlarged. The right jugular line is been removed in the interval. The right lung remains clear. Some slight increased density is noted projecting in the left lung base consistent with small effusion. Small posterior right-sided effusion is noted as well. Chronic degenerative changes of the shoulder joints are noted bilaterally. IMPRESSION: Bilateral pleural effusions left greater than right. Electronically Signed   By: Alcide Clever M.D.   On: 02/21/2015 14:35    Arvilla Market, DO 02/21/2015, 4:41 PM PGY-1, [facility] FPTS Intern pager: [phone], text pages welcome  I have seen and examined the patient. I have read and agree with the above note. My changes are noted in blue.  Jacquelin Hawking, MD PGY-3, [facility] 02/21/2015, 10:47 PM

## 2015-02-21 NOTE — Progress Notes (Signed)
Pt admitted to the unit at 1840. Pt mental status is A&OX4. Pt oriented to room, staff, and call bell. Skin is intact. Full assessment charted in CHL. Call bell within reach. Visitor guidelines reviewed w/ pt and/or family.

## 2015-02-22 ENCOUNTER — Inpatient Hospital Stay (HOSPITAL_COMMUNITY): Payer: Medicare Other

## 2015-02-22 DIAGNOSIS — D649 Anemia, unspecified: Secondary | ICD-10-CM | POA: Insufficient documentation

## 2015-02-22 DIAGNOSIS — I2510 Atherosclerotic heart disease of native coronary artery without angina pectoris: Secondary | ICD-10-CM

## 2015-02-22 DIAGNOSIS — R7989 Other specified abnormal findings of blood chemistry: Secondary | ICD-10-CM

## 2015-02-22 DIAGNOSIS — R778 Other specified abnormalities of plasma proteins: Secondary | ICD-10-CM

## 2015-02-22 DIAGNOSIS — I270 Primary pulmonary hypertension: Secondary | ICD-10-CM

## 2015-02-22 LAB — COMPREHENSIVE METABOLIC PANEL
ALBUMIN: 2.8 g/dL — AB (ref 3.5–5.0)
ALK PHOS: 46 U/L (ref 38–126)
ALT: 13 U/L — ABNORMAL LOW (ref 14–54)
ANION GAP: 7 (ref 5–15)
AST: 21 U/L (ref 15–41)
BUN: 23 mg/dL — ABNORMAL HIGH (ref 6–20)
CALCIUM: 8.8 mg/dL — AB (ref 8.9–10.3)
CO2: 23 mmol/L (ref 22–32)
Chloride: 107 mmol/L (ref 101–111)
Creatinine, Ser: 1.27 mg/dL — ABNORMAL HIGH (ref 0.44–1.00)
GFR calc Af Amer: 45 mL/min — ABNORMAL LOW (ref >60)
GFR calc non Af Amer: 39 mL/min — ABNORMAL LOW (ref >60)
GLUCOSE: 119 mg/dL — AB (ref 65–99)
POTASSIUM: 4.2 mmol/L (ref 3.5–5.1)
SODIUM: 137 mmol/L (ref 135–145)
Total Bilirubin: 1.4 mg/dL — ABNORMAL HIGH (ref 0.3–1.2)
Total Protein: 6.2 g/dL — ABNORMAL LOW (ref 6.5–8.1)

## 2015-02-22 LAB — TYPE AND SCREEN
ABO/RH(D): A POS
ANTIBODY SCREEN: NEGATIVE
Unit division: 0
Unit division: 0

## 2015-02-22 LAB — PROTIME-INR
INR: 2.64 — ABNORMAL HIGH (ref 0.00–1.49)
PROTHROMBIN TIME: 27.8 s — AB (ref 11.6–15.2)

## 2015-02-22 LAB — CBC WITH DIFFERENTIAL/PLATELET
Basophils Absolute: 0 10*3/uL (ref 0.0–0.1)
Basophils Relative: 0 %
Eosinophils Absolute: 0.1 10*3/uL (ref 0.0–0.7)
Eosinophils Relative: 1 %
HEMATOCRIT: 31 % — AB (ref 36.0–46.0)
Hemoglobin: 9.8 g/dL — ABNORMAL LOW (ref 12.0–15.0)
LYMPHS ABS: 1.3 10*3/uL (ref 0.7–4.0)
LYMPHS PCT: 18 %
MCH: 26.8 pg (ref 26.0–34.0)
MCHC: 31.6 g/dL (ref 30.0–36.0)
MCV: 84.9 fL (ref 78.0–100.0)
MONO ABS: 1 10*3/uL (ref 0.1–1.0)
MONOS PCT: 14 %
NEUTROS ABS: 4.7 10*3/uL (ref 1.7–7.7)
Neutrophils Relative %: 67 %
Platelets: 275 10*3/uL (ref 150–400)
RBC: 3.65 MIL/uL — ABNORMAL LOW (ref 3.87–5.11)
RDW: 16.9 % — AB (ref 11.5–15.5)
WBC: 7.2 10*3/uL (ref 4.0–10.5)

## 2015-02-22 LAB — TROPONIN I
TROPONIN I: 1.24 ng/mL — AB (ref <0.031)
TROPONIN I: 1.1 ng/mL — AB (ref <0.031)
Troponin I: 1.07 ng/mL (ref <0.031)

## 2015-02-22 LAB — BRAIN NATRIURETIC PEPTIDE: B NATRIURETIC PEPTIDE 5: 2229.9 pg/mL — AB (ref 0.0–100.0)

## 2015-02-22 MED ORDER — NITROGLYCERIN 0.4 MG SL SUBL
SUBLINGUAL_TABLET | SUBLINGUAL | Status: AC
Start: 2015-02-22 — End: 2015-02-22
  Administered 2015-02-22: 1 mg
  Filled 2015-02-22: qty 1

## 2015-02-22 MED ORDER — NITROGLYCERIN 0.4 MG SL SUBL
SUBLINGUAL_TABLET | SUBLINGUAL | Status: AC
  Administered 2015-02-22: 01:00:00
  Filled 2015-02-22: qty 2

## 2015-02-22 MED ORDER — ISOSORBIDE DINITRATE 10 MG PO TABS
10.0000 mg | ORAL_TABLET | Freq: Three times a day (TID) | ORAL | Status: DC
  Administered 2015-02-22 – 2015-02-23 (×3): 10 mg via ORAL
  Filled 2015-02-22 (×5): qty 1

## 2015-02-22 MED ORDER — FUROSEMIDE 20 MG PO TABS
20.0000 mg | ORAL_TABLET | Freq: Every day | ORAL | Status: DC
  Administered 2015-02-22 – 2015-02-23 (×2): 20 mg via ORAL
  Filled 2015-02-22 (×2): qty 1

## 2015-02-22 MED ORDER — LORAZEPAM 0.5 MG PO TABS
0.5000 mg | ORAL_TABLET | Freq: Once | ORAL | Status: AC
  Administered 2015-02-23: 0.5 mg via ORAL
  Filled 2015-02-22: qty 1

## 2015-02-22 MED ORDER — NITROGLYCERIN 0.4 MG SL SUBL
0.4000 mg | SUBLINGUAL_TABLET | SUBLINGUAL | Status: DC | PRN
  Administered 2015-02-22 (×5): 0.4 mg via SUBLINGUAL
  Filled 2015-02-22 (×4): qty 1

## 2015-02-22 NOTE — Progress Notes (Signed)
Pt still complaining of chest pain. Dr. Natale MilchLancaster paged at 6:02pm. MD called back at 6:03pm. PRN Nitro to be given and Chest xray ordered per MD. Vitals: BP 103/59 HR 73 O2 sats 100% on room air.

## 2015-02-22 NOTE — Progress Notes (Signed)
Patient complaining of mid sternal  chest pain non radiating rates 8/10 blood pressure 120/85.Oxygen at 2 l nasal cannula placed and call placed to Dr.Fitzgerald.

## 2015-02-22 NOTE — Progress Notes (Signed)
Data: Call to Pt room, complaining of substernal chest pain, radiating to back at 8/10. Pt complaining of difficulty getting breath.  Action: 2L O2 applied, 12 lead EKG obtained, 1 nitroglycerin sublingual given, and Dr. Natale MilchLancaster paged. BP 121/68 HR 64 Sats 100% 12 Lead EKG unchanaged from previous.  Response: MD returned page, information given, no orders received.

## 2015-02-22 NOTE — Progress Notes (Signed)
Family Medicine Teaching Service Daily Progress Note Intern Pager: [phone]  Patient name: Karina Willis Medical record number: [id number] Date of birth: 1933/02/05 Age: 81 y.o. Gender: female  Primary Care Provider: Peter Swaziland, MD Consultants: Cardiology Code Status: FULL   Assessment and Plan: Karina Willis is a 81 y.o. female presenting with symptomatic anemia with HgB of 6.1 in setting of a recent dental extraction with subsequent bleeding. PMH is significant for CHF, CAD, HTN, HLD and hx of TIAs.   Symptomatic Anemia: Patient presenting with HgB of 6.1 with light headness. Had recent dental extraction with bleeding. Baseline HgB approx 10. Patient has hx of macrocytic anemia with B12 deficiency, for which she takes supplements. Patient is not currently bleeding in her mouth. She denies blood in stools. Patient does take ASA and Xarelto daily. INR 4.07 at admission. She is s/p transfusion with 2U PRBC. Nursing home not able to explain why patient is on Xarelto. Unable to reach niece for information.Repeat INR improved to 2.64. Hgb this AM 8.8. Pt continuing to deny active bleeding. - Continue B12 and folic acid supplementation  - FOBT ordered  - HgB goal of 8.0 given her CAD - Per cardiology, Xarelto discontinued  Chronic HFpEF: Last echo (6/16): EF 55-60%, mild MR with mitral valve prolapse, pulm HTN and Grade I Diastolic Dysfunction. Also with moderate pulmonic and tricuspid regurgitation and elevated PA pressure. Possible PFO also seen on echo. Patient appears to be somewhat volume overloaded on exam; however, she is not in any respiratory distress. Seen by cardiology yesterday who felt patient was grossly volume overloaded on exam.  - Per cards, add isordil 10 mg TID and resume home Lasix 20 mg PO qd  Chest pain: With associated SOB. Occurred again 3 times yesterday, again with improvement after nitro administration. Troponins 1.24, 1.07, 1.01. Repeat EKG during chest pain was  unchanged from prior. Repeat CXR showed persistent effusion on the L but more prominent LLL consolidation consistent with PNA. Spoke with ID (Dr. Drue Second) regarding new diagnosis of PNA, who felt that since the patient does not seem clinically ill (no cough, no hypoxia, afebrile, as well as no leukocytosis) treatment at this time is not indicated. Pt denies pain this AM.  Per cardiology, angina may be precipitated by transfusions.  - Nitro PRN - Given chest pain with increased troponins, consider resuming ASA despite anemia - Per ID, follow-up with provide in one week to reassess PNA and possible abx  Hypotension: BPs 90s/60s on admission. Likely related to anemia. Now s/p 2U PRBC. Has improved to 110s/60s. -Monitor BPs   AKI: Baseline 0.9. Cr 1.29 at admission. 1.22 today.   -Monitor BMETs   CAD: Denies chest pain. Follows with Dr. Swaziland.  -Continue Lipitor   PVD: S/p left fem-pop bypass. Not currently on pharmacological treatment.   FEN/GI: SLIV, Soft diet  Prophylaxis: SCDs   Disposition: most likely return to ALF today  Subjective:  Patient experienced chest pain again last that didn't respond completely to nitro. She was given her home dose of Ativan 0.5 mg with improvement of pain. She also endorsed SOB, but was saturating at 100% on RA. She was started on supplemental O2 for comfort.  Patient denying chest pain or SOB this AM. Endorses mild HA but has no other complaints.   Objective: Temp:  [97.5 F (36.4 C)-98.4 F (36.9 C)] 97.7 F (36.5 C) (12/15 0610) Pulse Rate:  [73-78] 75 (12/15 0610) Resp:  [16-18] 18 (12/15 0610) BP: (103-120)/(59-71) 119/71  mmHg (12/15 0610) SpO2:  [99 %-100 %] 99 % (12/15 0610) Physical Exam: General: frail elderly female lying in bed Cardiovascular: RRR, no murmurs appreciated Respiratory: limited due to patient's limited ROM, no wheezes noted on anterior auscultation Abdomen: soft, non-tender, non-distended, +BS Extremities: 2+ pitting  edema to knees bilaterally with continued edema to hips, edema present in hands as well  Laboratory:  Recent Labs Lab 02/21/15 2005 02/22/15 1020 02/23/15 0129  WBC 8.3 7.2 8.1  HGB 8.0* 9.8* 8.8*  HCT 26.6* 31.0* 28.0*  PLT 308 275 279    Recent Labs Lab 02/21/15 1353 02/22/15 1020 02/23/15 0129  NA 138 137 136  K 4.8 4.2 3.9  CL 106 107 107  CO2 25 23 23   BUN 23* 23* 24*  CREATININE 1.29* 1.27* 1.22*  CALCIUM 8.6* 8.8* 8.3*  PROT  --  6.2*  --   BILITOT  --  1.4*  --   ALKPHOS  --  46  --   ALT  --  13*  --   AST  --  21  --   GLUCOSE 122* 119* 111*    Imaging/Diagnostic Tests: Dg Chest 2 View  02/21/2015  CLINICAL DATA:  Shortness of breath for 3 days EXAM: CHEST - 2 VIEW COMPARISON:  09/23/2014 FINDINGS: Cardiac shadow is mildly enlarged. The right jugular line is been removed in the interval. The right lung remains clear. Some slight increased density is noted projecting in the left lung base consistent with small effusion. Small posterior right-sided effusion is noted as well. Chronic degenerative changes of the shoulder joints are noted bilaterally. IMPRESSION: Bilateral pleural effusions left greater than right. Electronically Signed   By: Alcide CleverMark  Lukens M.D.   On: 02/21/2015 14:35    Marquette SaaAbigail Joseph Isabella Roemmich, MD 02/23/2015, 12:27 PM PGY-1, [facility] FPTS Intern pager: [phone], text pages welcome

## 2015-02-22 NOTE — Progress Notes (Signed)
Family Medicine Teaching Service Daily Progress Note Intern Pager: [phone]  Patient name: Karina Willis Medical record number: [id number] Date of birth: 11/11/1932 Age: 81 y.o. Gender: female  Primary Care Provider: Peter Swaziland, MD Consultants: None Code Status: FULL   Assessment and Plan: LINDSEY DEMONTE is a 81 y.o. female presenting with symptomatic anemia with HgB of 6.1 in setting of a recent dental extraction with subsequent bleeding. PMH is significant for CHF, CAD, HTN, HLD and hx of TIAs.   Symptomatic Anemia: Patient presenting with HgB of 6.1 with light headness. Had recent dental extraction with bleeding. Baseline HgB approx 10. Patient has hx of macrocytic anemia with B12 deficiency, for which she takes supplements. Patient is not currently bleeding in her mouth. She denies blood in stools. Patient does take ASA and Xarelto daily. INR 4.07 at admission. She is s/p transfusion with 2U PRBC. Nursing home not able to explain why patient is on Xarelto. Unable to reach niece for information.Hgb this AM 9.8.  -Continue B12 and folic acid supplementation  -Hold Xarelto and ASA  -FOBT ordered  - F/u repeat INR -HgB goal of 8.0 given her CAD  Chronic HFpEF: Last echo (6/16): EF 55-60%, mild MR with mitral valve prolapse, pulm HTN and Grade I Diastolic Dysfunction. Also with moderate pulmonic and tricuspid regurgitation and elevated PA pressure. Possible PFO also seen on echo. Patient appears to be somewhat volume overloaded on exam; however, she is not in any respiratory distress.  -Will not diuresis given low BPs and current anemia  -Consider repeat echo   Chest pain: With associated SOB. Improved after nitroglycerin. Pt denies pain this AM.  - F/u troponins, EKG  Hypotension: BPs 90s/60s on admission. Likely related to anemia. Has improved to 120s/80s overnight after receiving additional unit PRBC yesterday.  -Monitor BPs   AKI: Baseline 0.9. Cr 1.29 at admission. 1.27 to  1.29 today.   -Monitor BMETs   CAD: Denies chest pain. Follows with Dr. Swaziland.  -Continue Lipitor  -Hold ASA in light of recent bleeding & anemia   PVD: S/p left fem-pop bypass. Not currently on pharmacological treatment.   FEN/GI: SLIV, Soft diet  Prophylaxis: SCDs   Disposition: most likely return to ALF pending medical improvement   Subjective:  Pt had episode of chest pain overnight. She received nitroglycerin, and her pain resolved. Denying chest pain or SOB this AM. She was on 2L  when I entered the room this morning, but said she didn't know why because she is not having difficulty breathing anymore (since resolution of chest pain).   Objective: Temp:  [97.7 F (36.5 C)-98.6 F (37 C)] 98.4 F (36.9 C) (12/14 0511) Pulse Rate:  [75-92] 78 (12/14 1204) Resp:  [17-23] 18 (12/14 0511) BP: (84-127)/(55-83) 111/74 mmHg (12/14 1208) SpO2:  [93 %-100 %] 100 % (12/14 1208) Weight:  [182 lb 12.2 oz (82.9 kg)-185 lb 10 oz (84.2 kg)] 182 lb 12.2 oz (82.9 kg) (12/14 0511) Physical Exam: General: frail elderly female lying in bed Cardiovascular: RRR, no murmurs appreciated Respiratory: limited due to patient's refusal to cooperate with exam, no wheezes noted on anterior auscultation Abdomen: soft, non-tender, non-distended, +BS Extremities: 2+ pitting edema to knees bilaterally with continued edema to hips  Laboratory:  Recent Labs Lab 02/21/15 1353 02/21/15 2005 02/22/15 1020  WBC 7.1 8.3 7.2  HGB 6.1* 8.0* 9.8*  HCT 20.5* 26.6* 31.0*  PLT 328 308 275    Recent Labs Lab 02/21/15 1353 02/22/15 1020  NA 138  137  K 4.8 4.2  CL 106 107  CO2 25 23  BUN 23* 23*  CREATININE 1.29* 1.27*  CALCIUM 8.6* 8.8*  PROT  --  6.2*  BILITOT  --  1.4*  ALKPHOS  --  46  ALT  --  13*  AST  --  21  GLUCOSE 122* 119*    Imaging/Diagnostic Tests: Dg Chest 2 View  02/21/2015  CLINICAL DATA:  Shortness of breath for 3 days EXAM: CHEST - 2 VIEW COMPARISON:  09/23/2014  FINDINGS: Cardiac shadow is mildly enlarged. The right jugular line is been removed in the interval. The right lung remains clear. Some slight increased density is noted projecting in the left lung base consistent with small effusion. Small posterior right-sided effusion is noted as well. Chronic degenerative changes of the shoulder joints are noted bilaterally. IMPRESSION: Bilateral pleural effusions left greater than right. Electronically Signed   By: Alcide CleverMark  Lukens M.D.   On: 02/21/2015 14:35    Marquette SaaAbigail Joseph Elner Seifert, MD 02/22/2015, 12:13 PM PGY-1, [facility] FPTS Intern pager: [phone], text pages welcome

## 2015-02-22 NOTE — Progress Notes (Signed)
Informed that patient was having 8/10 chest pain around 1230 am. Improved to 2/10 with SL nitro 0.4 mg x 3. EKG showed no acute changes. Ordered troponin.   Upon bedside evaluation, patient indicated central chest pain. Pain was reproducible on exam. Still with SOB, which improved with raising the head of the bed a little. Nasal cannula in place with 4L O2. RRR, 3/6 systolic murmur. Diminished breath sounds. 2+ pitting edema on LLE, 3+ of RLE.   Prn order for nitro placed, using downtime form due to Epic maintenance.

## 2015-02-22 NOTE — Progress Notes (Signed)
1243 Continues to complain mid sternal chest pain  8/10 blood pressure 121/82 HR 84  . One  sl nitroglycerin given .1248 Mid sternal chest pain continues  rates 8/10 blood pressure 119/73 HR 81 second sl nitroglycerin given and 12 lead EKG obtained.1253 Pain decreased 6/10 blood pressure 115/83 HR 80 third sl nitroglycerin given.1258 Pain decreased  2/10 blood pressure 106/70 HR 81 Dr. Sampson GoonFitzgerald at bedside.Will continue to monitor.

## 2015-02-22 NOTE — Consult Note (Signed)
CARDIOLOGY CONSULT NOTE   Patient ID: Karina Willis MRN: [id number] DOB/AGE: Jul 23, 1932 81 y.o.  Admit date: 02/21/2015   Primary Cardiologist   Dr. Swaziland Reason for Consultation   Elevated troponin  HPI: Karina Willis is a 81 y.o. female with a history of CAD, ischemic CM--> now normalized EF, severe pulmonary HTN, moderate to severe MR, PAD with critical limb ischemia, HTN, HLD and history of TIAs who was admitted to [facility] on 02/22/15 with symptomatic anemia after dental extractions and subsequent bleeding (Hg 6.1) in the setting of Xarelto use.    She has been taking xarelto for unclear reasons. She has no history of PAF, DVT/PE. She lives at [facility] and they are unable to explain why she is on Xarelto. Patient states she just takes what the "dr gives her."  LHC 3/01: Mid LAD 50%, ostial circumflex 80%, then 99% prior to OM1, then occluded; proximal RCA occluded, EF 35-40% with inferior AK. EF has been reportedly as low as 25-30%. She has refused follow up echos in the past.   She was admitted in 07/2014 with cellulitis LLE. She developed critical limb ischemia. She was diagnosed with severe PAD and underwent left femoral to above the knee bypass by Dr. Arbie Cookey. Noted to have severe tibial disease as well. Pre op evaluation with Echo showed EF 55-60%, basal septal hypertrophy, diastolic dysfunction, and severe pulmonary HTN with estimated RV pressure of 75 mm Hg. MVP with mild to moderate MR. Since her surgery she has done well. Living at [facility] center.   She was last seen by Dr. Swaziland in 10/2014. Not a candidate for revascularization based on old cath data.    She presented to [facility] for an "abnormal lab" of Hg ~6.3 from [facility] and SOB. She is a poor historian but feels weak and mildly SOB. She has some mild chest discomfort but it is difficult to understand what she is trying to say. She has LE edema and some orthopnea. Wants to eat her  lunch. No other complaints.     Past Medical History  Diagnosis Date  . CHF (congestive heart failure) (HCC)   . Mitral insufficiency   . Coronary artery disease 2001    WITH DOCUMENTED TOTAL OCCLUSION OF THE RIGHT CORONARY AND THE LEFT CIRCUMFLEX CORONARY  . SOB (shortness of breath)   . Hypertension   . Hyperlipidemia   . History of TIAs   . Arthritis   . Non compliance w medication regimen   . Neck pain   . PAD (peripheral artery disease) (HCC)   . Critical lower limb ischemia   . Pulmonary HTN (HCC)      Past Surgical History  Procedure Laterality Date  . Cardiac catheterization  06/05/1999    ENLARGED LEFT VENTRICULAR SIZE. THERE IS AKINESIA OF THE INFERIOR BASE. LEFT VENTRICULAR  FUNCTIONS APPEAR TO BE MODERATELY REDUCED WITH EF 35-40%  . Appendectomy    . Left arm surgery    . Peripheral vascular catheterization Left 09/21/2014    Procedure: Lower Extremity Angiography;  Surgeon: Larina Earthly, MD;  Location: [facility] INVASIVE CV LAB;  Service: Cardiovascular;  Laterality: Left;  . Femoral-popliteal bypass graft Left 09/26/2014    Procedure: LEFT FEMORAL-ABOVE THE KNEE POPLITEAL ARTERY BYPASS GRAFT USING NON REVERSE LEFT GREATER SAPHENOUS VEIN. ;  Surgeon: Larina Earthly, MD;  Location: [facility] OR;  Service: Vascular;  Laterality: Left;    Allergies  Allergen Reactions  .  Penicillins Hives and Itching    Pt reports not being allergic to penicillin Tolerates Ancef    I have reviewed the patient's current medications . atorvastatin  20 mg Oral q1800  . folic acid  1 mg Oral Daily  . hydrocerin  1 application Topical BID  . sodium chloride  3 mL Intravenous Q12H  . vitamin B-12  1,000 mcg Oral Daily     sodium chloride, acetaminophen, nitroGLYCERIN, senna-docusate, sodium chloride  Prior to Admission medications   Medication Sig Start Date End Date Taking? Authorizing Provider  acetaminophen (TYLENOL) 325 MG tablet Take 2 tablets (650 mg total) by mouth every 6 (six) hours as  needed for moderate pain. 08/04/13  Yes Nani RavensAndrew M Wight, MD  aspirin EC 81 MG EC tablet Take 1 tablet (81 mg total) by mouth daily. 09/28/14  Yes Gust RungErik C Hoffman, DO  aspirin-acetaminophen-caffeine (EXCEDRIN MIGRAINE) [phone] MG per tablet Take 1 tablet by mouth every 6 (six) hours as needed for headache.   Yes Historical Provider, MD  atorvastatin (LIPITOR) 20 MG tablet Take 20 mg by mouth daily.   Yes Historical Provider, MD  docusate sodium (COLACE) 100 MG capsule Take 1 capsule (100 mg total) by mouth 2 (two) times daily as needed for mild constipation. 09/28/14  Yes Gust RungErik C Hoffman, DO  folic acid (FOLVITE) 1 MG tablet Take 1 tablet (1 mg total) by mouth daily. 09/28/14  Yes Gust RungErik C Hoffman, DO  furosemide (LASIX) 20 MG tablet Take 20 mg by mouth daily.   Yes Historical Provider, MD  hydrocerin (EUCERIN) CREA Apply 1 application topically 2 (two) times daily. 08/16/14  Yes Tasrif Ahmed, MD  LORazepam (ATIVAN) 0.5 MG tablet Take 0.5 mg by mouth every 12 (twelve) hours as needed for anxiety.   Yes Historical Provider, MD  LORazepam (ATIVAN) 2 MG/ML concentrated solution Place 0.5 mg into feeding tube once. Dental appt   Yes Historical Provider, MD  nitroGLYCERIN (NITROSTAT) 0.4 MG SL tablet Place 1 tablet (0.4 mg total) under the tongue every 5 (five) minutes as needed for chest pain. Must keep appointment 10/31/14 with Dr SwazilandJordan 09/16/14  Yes Jaydalyn Demattia M SwazilandJordan, MD  oxycodone (OXY-IR) 5 MG capsule Take 5 mg by mouth every 4 (four) hours as needed for pain.   Yes Historical Provider, MD  oxyCODONE-acetaminophen (PERCOCET/ROXICET) 5-325 MG per tablet Take 1 tablet by mouth every 6 (six) hours as needed for moderate pain. 09/28/14  Yes Gust RungErik C Hoffman, DO  phenol (CHLORASEPTIC) 1.4 % LIQD Use as directed 1 spray in the mouth or throat as needed for throat irritation / pain. 09/28/14  Yes Gust RungErik C Hoffman, DO  polyethylene glycol (MIRALAX / GLYCOLAX) packet Take 17 g by mouth daily as needed. 09/28/14  Yes Gust RungErik C Hoffman,  DO  Potassium Chloride (KLOR-CON 10 PO) Take 10 mg by mouth daily.   Yes Historical Provider, MD  Rivaroxaban (XARELTO) 15 MG TABS tablet Take 15 mg by mouth daily.   Yes Historical Provider, MD  vitamin B-12 1000 MCG tablet Take 1 tablet (1,000 mcg total) by mouth daily. 08/16/14  Yes Hyacinth Meekerasrif Ahmed, MD     Social History   Social History  . Marital Status: Divorced    Spouse Name: N/A  . Number of Children: 0  . Years of Education: N/A   Occupational History  . waitress     retired   Social History Main Topics  . Smoking status: Former Smoker    Types: Cigarettes    Quit  date: 08/21/1983  . Smokeless tobacco: Never Used  . Alcohol Use: No  . Drug Use: No  . Sexual Activity: Not on file   Other Topics Concern  . Not on file   Social History Narrative    Family Status  Relation Status Death Age  . Mother Deceased 82  . Father Deceased 30  . Sister Deceased   . Brother Deceased   . Brother Deceased   . Brother Deceased   . Sister Deceased    Family History  Problem Relation Age of Onset  . Heart attack Mother   . Heart attack Father   . Heart disease Sister   . Heart attack Brother   . Heart attack Brother   . Heart attack Brother   . Heart disease Sister      ROS:  Full 14 point review of systems complete and found to be negative unless listed above.  Physical Exam: Blood pressure 111/74, pulse 78, temperature 98.4 F (36.9 C), temperature source Oral, resp. rate 18, height  (1.448 m), weight 182 lb 12.2 oz (82.9 kg), SpO2 100 %.  General: Well developed, well nourished, female in no acute distress eldery chornically ill appearing Head: Eyes PERRLA, No xanthomas.   Normocephalic and atraumatic, oropharynx without edema or exudate.  Lungs: crackles at bases Heart: HRRR S1 S2, no rub/gallop, Heart irregular rate and rhythm with S1, S2  murmur. pulses are 2+ extrem.   Neck: No carotid bruits. No lymphadenopathy.  +JVD. Abdomen: Bowel sounds present,  abdomen soft and non-tender without masses or hernias noted. Msk:  No spine or cva tenderness. No weakness, no joint deformities or effusions. Extremities: No clubbing or cyanosis.  + anasarca .  Neuro: Alert and oriented X 3. No focal deficits noted. Psych:  Good affect, responds appropriately Skin: No rashes or lesions noted.  Labs:  Lab Results  Component Value Date   WBC 7.2 02/22/2015   HGB 9.8* 02/22/2015   HCT 31.0* 02/22/2015   MCV 84.9 02/22/2015   PLT 275 02/22/2015    Recent Labs  02/22/15 1024  INR 2.64*    Recent Labs Lab 02/22/15 1020  NA 137  K 4.2  CL 107  CO2 23  BUN 23*  CREATININE 1.27*  CALCIUM 8.8*  PROT 6.2*  BILITOT 1.4*  ALKPHOS 46  ALT 13*  AST 21  GLUCOSE 119*  ALBUMIN 2.8*   MAGNESIUM  Date Value Ref Range Status  09/25/2014 1.9 1.7 - 2.4 mg/dL Final    Recent Labs  16/10/96 1020  TROPONINI 1.24*   No results for input(s): TROPIPOC in the last 72 hours. PRO B NATRIURETIC PEPTIDE (BNP)  Date/Time Value Ref Range Status  08/21/2010 10:52 AM 373.0* 0.0 - 100.0 pg/mL Final   Lab Results  Component Value Date   CHOL 170 11/18/2012   HDL 58.40 11/18/2012   LDLCALC 99 11/18/2012   TRIG 62.0 11/18/2012   No results found for: DDIMER LIPASE  Date/Time Value Ref Range Status  08/16/2014 04:00 AM 39 22 - 51 U/L Final   No results found for: TSH, T4TOTAL, T3FREE, THYROIDAB  Echo: Echo: 08/15/14:Study Conclusions - Left ventricle: The cavity size was normal. There was mild focal basal hypertrophy of the septum. Systolic function was normal. The estimated ejection fraction was in the range of 55% to 60%. Doppler parameters are consistent with abnormal left ventricular relaxation (grade 1 diastolic dysfunction). The E/e&' ratio is between 8-15, suggesting indeterminate LV filling pressure. - Ventricular septum:  Septal motion showed paradox. The contour showed diastolic flattening. - Aortic valve: Trileaflet. Sclerosis  without stenosis. There was no regurgitation. - Mitral valve: Mildly thickened leaflets with borderline, late systolic bileaflet prolapse. There was mild regurgitation. - Right ventricle: The cavity size was moderately dilated. The moderator band was prominent. - Right atrium: The atrium was mildly dilated. - Atrial septum: Mobile IAS - cannot exclude PFO. - Tricuspid valve: There was moderate regurgitation. - Pulmonic valve: Poorly visualized. There was moderate regurgitation. - Pulmonary arteries: PA peak pressure: 75 mm Hg (S). Impressions: - LVEF 55-60%, mild focal basal septal hypertrophy, diastolic dysfunction, indeterminate LV filling pressure, moderately dilated RV with normal systolic function, mildly dilated LA, moderate TR and PR with severe pulmonary hypertension (RVSP 75 mmHG), with an elevated RA pressure of 8 mmHg.   Proc. Date: 06/05/99                       Cardiac Catheterization   PROCEDURES:  Right and left heart catheterization, coronary angiography. INDICATION FOR PROCEDURE:  The patient is a 81 year old black female who presents with evidence of congestive heart failure with symptoms of dyspnea and chest pain on exertion. ANGIOGRAPHIC DATA: A left ventricular angiography was performed in the RAO view.  This demonstrates enlarged left ventricular chamber size.  There is akinesia of the inferior base.  Otherwise, global hypokinesia.  Exact ejection fraction was difficult to estimate given significant ventricular ectopy; however, left ventricular function appears to be moderately reduced with ejection fraction estimated at 35-40%.  There appears to be moderate to severe mitral insufficiency, graded at 3+.  Again, this appears worse with ventricular ectopy.  The aortic valve appears normal. 1. The left coronary demonstrates a shared ostium with the LAD and left    circumflex. 2. The LAD is a very large vessel which wraps around the apex and  supplies    part of the inferior wall.  This vessel has a 50% stenosis in the mid    vessel; otherwise, diffuse wall irregularities. 3. The left circumflex coronary has an 80% stenosis at its ostium.  It has    a 99% stenosis prior to the first obtuse marginal vessel and then is    occluded. 4. The right coronary artery arises normally and is occluded proximally.   FINAL INTERPRETATION: 1. Severe two-vessel occlusive atherosclerotic coronary artery disease. 2. Borderline stenosis in the mid left anterior descending artery. 3. Moderate left ventricular dysfunction. 4. Moderate to severe mitral insufficiency. 5. Severe pulmonary hypertension with elevated left ventricular filling    pressures.   ECG: NSR with low voltage QRS and TW flattening and inversions diffusely ( 02/12/15) new from previous.   Radiology:  Dg Chest 2 View  02/21/2015  CLINICAL DATA:  Shortness of breath for 3 days EXAM: CHEST - 2 VIEW COMPARISON:  09/23/2014 FINDINGS: Cardiac shadow is mildly enlarged. The right jugular line is been removed in the interval. The right lung remains clear. Some slight increased density is noted projecting in the left lung base consistent with small effusion. Small posterior right-sided effusion is noted as well. Chronic degenerative changes of the shoulder joints are noted bilaterally. IMPRESSION: Bilateral pleural effusions left greater than right. Electronically Signed   By: Alcide Clever M.D.   On: 02/21/2015 14:35    ASSESSMENT AND PLAN:    Active Problems:   Anemia  Karina Willis is a 81 y.o. female with a history of CAD, ischemic CM--> now normalized EF,  severe pulmonary HTN, moderate to severe MR, PAD with critical limb ischemia, HTN, HLD and history of TIAs who was admitted to [facility] on 02/22/15 with symptomatic anemia after dental extractions and subsequent bleeding (Hg 6.1) in the setting of Xarelto use.    Anemia- she presented to [facility] with Hg 6.1 after dental surgery in the  setting of Xarelto use ( unclear reasons why she is on Xarelto. She has no history of PAF, DVT/PE.) She has now received transfusions and her HG is up to 9.8. Agree with stopping Xarelto  CAD/Elevated tropnin- she does have some mild chest discomfort. Trop elevated to 1.24. Per old notes she is is not a candidate for revascularization based old cath data. This is likely demand ischemia in the setting of known CAD, acute anemia and acute CHF  Acute on chronic diastolic CHF:  Recent Echo showed normalization of EF. Echo (6/16): EF 55-60%, mild MR with mitral valve prolapse, pulm HTN, G1DD, mod PR/TR and elevated PA pressure. BNP elevated to 2229 and CXR with bilateral pleural effusions. Grossly volume overloaded on exam.  -- Will add isordil 10mg  TID and home dose of lasix 20mg  poqd.  AKI- creat historically normal. Creat 1.29--> 1.27.   Mitral Regurgitation/ mitral valve prolapse/Severe pulmonary HTN:  Most recent ECHO with mild MR. A degree of MR less than in past. Severe pulmonary HTN probably related to history of MR and diastolic dysfunction. Minimal right heart failure. Will add isordil 10mg  TID for severe pulmonary HTN  Hypertension: pressures have been soft  Hyperlipidemia: Continue statin.  PAD with critical limb ischemia s/p left fem-above the knee bypass. Stable     SignedAllena Katz 02/22/2015 12:29 PM  Pager [phone]  Co-Sign MD   Patient examined chart reviewed. Chronically ill elderly female admitted with anemia.  Indication for xarelto not clear.   Appears volume overloaded on exam edema in legs and hands. Recent left fempop with poor distal pulses.  JVP elevated with pulmonary HTN.  CAD known not revascularizable.  Minimal elevation in troponin with no ECG changes and pain free now after one nitro.  Will resume home dose diuretic.  Add isordil for angina/CAD and pulmonary HTN.  No indication for invasive evaluation given age co-morbidities, and marked  anemia requiring transfusion which may have ppt her angina.    Charlton Haws

## 2015-02-22 NOTE — Discharge Summary (Signed)
[facility]  Patient name: Karina Willis Medical record number: [id number] Date of birth: 10/10/1932 Age: 81 y.o. Gender: female Date of Admission: 02/21/2015  Date of Discharge: 02/23/15 Admitting Physician: Nestor RampSara L Neal, MD  Primary Care Provider: Peter SwazilandJordan, MD Consultants: cardiology  Indication for Hospitalization: symptomatic anemia   Discharge Diagnoses/Problem List:  Patient Active Problem List   Diagnosis Date Noted  . Elevated troponin 02/22/2015  . Acute diastolic heart failure (HCC) 02/22/2015  . Symptomatic anemia   . Pulmonary HTN (HCC) 10/31/2014  . Severe peripheral arterial disease (HCC) 09/28/2014  . Pressure ulcer 09/23/2014  . B12 deficiency 08/15/2014  . Moderate malnutrition (HCC) 08/15/2014  . Prediabetes 08/15/2014  . Cellulitis of great toe of left foot 08/15/2014  . Hypotension 08/15/2014  . AKI (acute kidney injury) (HCC) 08/14/2014  . Hypovolemia 08/14/2014  . Anemia 08/03/2013  . Bilateral hip pain 08/01/2013  . Fall at home 08/01/2013  . Chronic diastolic CHF (congestive heart failure) (HCC)   . Mitral insufficiency   . Coronary artery disease   . SOB (shortness of breath)   . History of TIAs   . Arthritis   . B12 deficiency anemia 04/24/2009  . UNSPECIFIED VITAMIN D DEFICIENCY 06/13/2008  . Chronic kidney disease (CKD), stage III (moderate) 06/13/2008  . POSTMENOPAUSAL STATUS 06/03/2008  . KNEE PAIN, LEFT 12/03/2007  . HYPERCHOLESTEROLEMIA 05/08/2006  . OBESITY, NOS 05/08/2006  . HYPERTENSION, BENIGN SYSTEMIC 05/08/2006  . COR PULMONALE 05/08/2006  . Mitral valve disorder 05/08/2006  . OSTEOARTHRITIS, MULTI SITES 05/08/2006    Disposition: back to SNF  Discharge Condition: stable  Discharge Exam:  General: frail elderly female lying in bed Cardiovascular: RRR, no murmurs appreciated Respiratory: limited due to patient's limited ROM, no wheezes noted on anterior  auscultation Abdomen: soft, non-tender, non-distended, +BS Extremities: 2+ pitting edema to knees bilaterally with continued edema to hips, edema present in hands as well  Brief Hospital Course:  Patient presented from SNF with symptomatic anemia after a recent dental extraction and subsequent bleeding. She was experiencing lightheadedness, and was found to have Hgb 6.1 at her SNF. She was subsequently brought to the ED.   Symptomatic anemia Patient received 1U PRBC in ED, but remained hypotensive, with BP 90s/60s. She then received another unit, with improvement in BP. Hgb increased from 6.1 to 9.8 after two units. The patient was on Xarelto, however the reason for this was unknown. Xarelto was discontinued on admission, and per cardiology, not resumed at discharge. After patient maintained stable Hgb ~9, she was deemed safe for discharge back to SNF.   Chronic HFpEF Patient grossly fluid overloaded on admission, with pitting edema to her hips. She was evaluated by cardiology, who recommended continuing home Lasix, and adding isordil.   Chest pain Patient had central chest pain at rest with associated SOB on first night of admission. Pain was reproducible on exam, and improved with nitroglycerin and elevation of the head of the bed. She was also placed on 4L [place]. Troponins were elevated x3 (1.24, 1.07, 1.01), however EKG was unchanged.   The patient endorsed chest pain again on the second day of admission, which also improved with nitro. She was then seen by cardiology, who suggested patient's chest pain may have been precipitated by transfusions. Isordil was added to patient's medication regimen both for CAD and angina.   She then had a third and final episode of chest pain, that did not resolve entirely with nitroglycerin. She had  not been receiving the 0.5 mg Ativan dose at night that she receives at her SNF, and thought that might improve her pain. After Ativan administration, her pain resolved.    CXR obtained on admission showed bilateral pleural effusions more prominent on the L, and repeat CXR obtained day before admission during episode of chest pain showed persistent effusion on the L but now with left lower lobe consolidation consistent with PNA. ID was consulted, who felt that, as the patient did not have clinical signs of PNA (no tachycardia, hypoxia, fever, leukocytosis, cough), antibiotic treatment is not indicated at this time.   Issues for Follow Up:  1. As there was no clear indication, Xarelto was discontinued.  2. Patient was started on isordil 10 mg TID for angina/CAD as well as pulmonary HTN.  3. Despite anemia, ASA 81 mg was resumed given chest pain with elevated troponins. 4. Per ID, patient should follow-up with PCP in one week to reassess PNA and potential need for abx.   Significant Procedures: none  Significant Labs and Imaging:   Recent Labs Lab 02/21/15 2005 02/22/15 1020 02/23/15 0129  WBC 8.3 7.2 8.1  HGB 8.0* 9.8* 8.8*  HCT 26.6* 31.0* 28.0*  PLT 308 275 279    Recent Labs Lab 02/21/15 1353 02/22/15 1020 02/23/15 0129  NA 138 137 136  K 4.8 4.2 3.9  CL 106 107 107  CO2 GLUCOSE 122* 119* 111*  BUN 23* 23* 24*  CREATININE 1.29* 1.27* 1.22*  CALCIUM 8.6* 8.8* 8.3*  ALKPHOS  --  46  --   AST  --  21  --   ALT  --  13*  --   ALBUMIN  --  2.8*  --    Dg Chest 2 View  02/22/2015  CLINICAL DATA:  Left-sided chest pain EXAM: CHEST  2 VIEW COMPARISON:  February 21, 2015 FINDINGS: There is consolidation throughout the left lower lobe with left effusion. Right lung is clear. There is cardiomegaly with pulmonary vascularity within normal limits. No adenopathy. There is advanced arthropathy in both shoulders with extensive remodeling of the left humeral head. There is a Hill-Sachs defect on the right. Bones are osteoporotic. IMPRESSION: Left lower lobe consolidation with left effusion. Stable cardiomegaly. Advanced arthropathy in both  shoulders. Electronically Signed   By: Bretta Bang III M.D.   On: 02/22/2015 20:23   Dg Chest 2 View  02/21/2015  CLINICAL DATA:  Shortness of breath for 3 days EXAM: CHEST - 2 VIEW COMPARISON:  09/23/2014 FINDINGS: Cardiac shadow is mildly enlarged. The right jugular line is been removed in the interval. The right lung remains clear. Some slight increased density is noted projecting in the left lung base consistent with small effusion. Small posterior right-sided effusion is noted as well. Chronic degenerative changes of the shoulder joints are noted bilaterally. IMPRESSION: Bilateral pleural effusions left greater than right. Electronically Signed   By: Alcide Clever M.D.   On: 02/21/2015 14:35    Results/Tests Pending at Time of Discharge: none  Discharge Medications:    Medication List    STOP taking these medications        Rivaroxaban 15 MG Tabs tablet  Commonly known as:  XARELTO      TAKE these medications        acetaminophen 325 MG tablet  Commonly known as:  TYLENOL  Take 2 tablets (650 mg total) by mouth every 6 (six) hours as needed for moderate pain.  aspirin 81 MG EC tablet  Take 1 tablet (81 mg total) by mouth daily.     aspirin-acetaminophen-caffeine 250-250-65 MG tablet  Commonly known as:  EXCEDRIN MIGRAINE  Take 1 tablet by mouth every 6 (six) hours as needed for headache.     atorvastatin 20 MG tablet  Commonly known as:  LIPITOR  Take 20 mg by mouth daily.     cyanocobalamin 1000 MCG tablet  Take 1 tablet (1,000 mcg total) by mouth daily.     docusate sodium 100 MG capsule  Commonly known as:  COLACE  Take 1 capsule (100 mg total) by mouth 2 (two) times daily as needed for mild constipation.     folic acid 1 MG tablet  Commonly known as:  FOLVITE  Take 1 tablet (1 mg total) by mouth daily.     hydrocerin Crea  Apply 1 application topically 2 (two) times daily.     isosorbide dinitrate 10 MG tablet  Commonly known as:  ISORDIL  Take 1  tablet (10 mg total) by mouth 3 (three) times daily.     KLOR-CON 10 PO  Take 10 mg by mouth daily.     LASIX 20 MG tablet  Generic drug:  furosemide  Take 20 mg by mouth daily.     LORazepam 0.5 MG tablet  Commonly known as:  ATIVAN  Take 0.5 mg by mouth every 12 (twelve) hours as needed for anxiety.     LORazepam 2 MG/ML concentrated solution  Commonly known as:  ATIVAN  Place 0.5 mg into feeding tube once. Dental appt     nitroGLYCERIN 0.4 MG SL tablet  Commonly known as:  NITROSTAT  Place 1 tablet (0.4 mg total) under the tongue every 5 (five) minutes as needed for chest pain. Must keep appointment 10/31/14 with Dr Swaziland     oxycodone 5 MG capsule  Commonly known as:  OXY-IR  Take 5 mg by mouth every 4 (four) hours as needed for pain.     oxyCODONE-acetaminophen 5-325 MG tablet  Commonly known as:  PERCOCET/ROXICET  Take 1 tablet by mouth every 6 (six) hours as needed for moderate pain.     phenol 1.4 % Liqd  Commonly known as:  CHLORASEPTIC  Use as directed 1 spray in the mouth or throat as needed for throat irritation / pain.     polyethylene glycol packet  Commonly known as:  MIRALAX / GLYCOLAX  Take 17 g by mouth daily as needed.        Discharge Instructions: Please refer to Patient Instructions section of EMR for full details.  Patient was counseled important signs and symptoms that should prompt return to medical care, changes in medications, dietary instructions, activity restrictions, and follow up appointments.   Follow-Up Appointments: Follow-up Information    Follow up with [facility].   Specialty:  Skilled Nursing Facility   Contact information:   [address] [place] [address] [phone]      Follow up with Peter Swaziland, MD. Schedule an appointment as soon as possible for a visit in 1 week.   Specialty:  Cardiology   Why:  For hospital follow-up   Contact information:   [address] [place]  [address] [place] [address] [phone]       Marquette Saa, MD 02/23/2015, 12:58 PM PGY-1, [facility]

## 2015-02-22 NOTE — Care Management Note (Signed)
Case Management Note  Patient Details  Name: Karina Willis MRN: [id number] Date of Birth: 06/07/1932  Subjective/Objective:   [facility] she states she plans to go back there at dc via ambulance, CSW following.                  Action/Plan:   Expected Discharge Date:                  Expected Discharge Plan:  Skilled Nursing Facility  In-House Referral:  Clinical Social Work  Discharge planning Services     Post Acute Care Choice:    Choice offered to:     DME Arranged:    DME Agency:     HH Arranged:    HH Agency:     Status of Service:  Completed, signed off  Medicare Important Message Given:    Date Medicare IM Given:    Medicare IM give by:    Date Additional Medicare IM Given:    Additional Medicare Important Message give by:     If discussed at Long Length of Stay Meetings, dates discussed:    Additional Comments:  Leone Havenaylor, Jameica Couts [place], RN 02/22/2015, 4:03 PM

## 2015-02-22 NOTE — Progress Notes (Signed)
Patient with continued chest pain since this evening not relieved by nitroglycerin. Reproducible on exam. Also complains of SOB but was saturating 100% on RA, 2L O2 by [place] was provided for comfort. Lungs CTAB. Home medication includes 0.5 mg ativan q12h prn for anxiety. Scheduled repeat troponin to be drawn at midnight. Patient concerned about changes to her normal routine while hospitalized, like how often to change position in bed. One time dose of 0.5 mg ativan ordered.

## 2015-02-22 NOTE — Evaluation (Addendum)
Physical Therapy Evaluation Patient Details Name: Karina Willis Spagnuolo MRN: [id number] DOB: 02/07/1933 Today's Date: 02/22/2015   History of Present Illness  Karina Willis Asfaw is a 81 y.o. female presenting with symptomatic anemia with HgB of 6.1 in setting of a recent dental extraction with subsequent bleeding. PMH is significant for CHF, CAD, HTN, HLD and hx of TIAs  Clinical Impression  Pt admitted with the above complications. Pt currently with functional limitations due to the deficits listed below (see PT Problem List). Complains of BIL LE pain with notable edema. Unable to tolerate weight-bearing through LEs due to this pain. States [facility] Tolerated exercises and bed mobility training fairly well. SpO2 99% on room air with 3/4 dyspnea throughout therapy session, HR in 80s. Pt will benefit from skilled PT to increase their independence and safety with mobility to allow discharge to the venue listed below.       Follow Up Recommendations SNF    Equipment Recommendations  None recommended by PT    Recommendations for Other Services       Precautions / Restrictions Precautions Precautions: Fall Restrictions Weight Bearing Restrictions: No      Mobility  Bed Mobility Overal bed mobility: Needs Assistance Bed Mobility: Supine to Sit;Sit to Supine     Supine to sit: Mod assist;HOB elevated Sit to supine: Mod assist   General bed mobility comments: Mod assist for LE support and to scoot with bed pad to EOB. Demonstrates fair UE strength with pushing through elbow to assume seated position. VC for technique and use of rail with HOB elevated.  Transfers                 General transfer comment: Unable to tolerate WB 2/2 pain  Ambulation/Gait                Stairs            Wheelchair Mobility    Modified Rankin (Stroke Patients Only)       Balance Overall balance assessment: Needs assistance Sitting-balance  support: No upper extremity supported;Feet unsupported Sitting balance-Leahy Scale: Fair Sitting balance - Comments: Tolerated sitting EOB x 10 minutes with intermittent unsupported sitting without use of UEs but majority of time required UE support.                                      Pertinent Vitals/Pain Pain Assessment: Faces Faces Pain Scale: Hurts even more Pain Location: BIL LEs Pain Descriptors / Indicators: Sore Pain Intervention(s): Monitored during session;Repositioned    Home Living Family/patient expects to be discharged to:: Skilled nursing facility Living Arrangements: Other relatives Available Help at Discharge: Family;Available PRN/intermittently Type of Home: Apartment Home Access: Level entry     Home Layout: Two level;Able to live on main level with bedroom/bathroom Home Equipment: Dan HumphreysWalker - 2 wheels;Bedside commode Additional Comments: Lives with nephew who have converted the living room into a bedroom.  Uses BSC.  Niece assists her intermittently with ADLs     Prior Function Level of Independence: Needs assistance   Gait / Transfers Assistance Needed: Transfers to/from w/c using rails to pull up at SNF. Has not ambulated at SNF in about 2 months due to foot pain.  ADL's / Homemaking Assistance Needed: Assist with ADLS as SNF        Hand Dominance   Dominant  Hand: Right    Extremity/Trunk Assessment   Upper Extremity Assessment: Defer to OT evaluation           Lower Extremity Assessment: Generalized weakness (Edema BIL)         Communication   Communication: No difficulties  Cognition Arousal/Alertness: Awake/alert Behavior During Therapy: WFL for tasks assessed/performed Overall Cognitive Status: Within Functional Limits for tasks assessed                      General Comments General comments (skin integrity, edema, etc.): SpO2 99% on room air, HR in 80s. Pt with chest pain after returning to bed, RN notified.  Pt unwilling to place feet on floor due to BIL LE pain. Dyspnea with upright sitting, cues for pursed lip breathing.    Exercises General Exercises - Lower Extremity Ankle Circles/Pumps: AROM;Both;10 reps;Supine Quad Sets: Strengthening;Both;10 reps;Supine Long Arc Quad: Strengthening;Both;10 reps;Seated      Assessment/Plan    PT Assessment Patient needs continued PT services  PT Diagnosis Difficulty walking;Generalized weakness;Acute pain   PT Problem List Decreased strength;Decreased range of motion;Decreased activity tolerance;Decreased balance;Decreased mobility;Decreased knowledge of use of DME;Cardiopulmonary status limiting activity;Obesity;Pain  PT Treatment Interventions DME instruction;Gait training;Functional mobility training;Therapeutic activities;Therapeutic exercise;Balance training;Neuromuscular re-education;Patient/family education   PT Goals (Current goals can be found in the Care Plan section) Acute Rehab PT Goals Patient Stated Goal: Get back to walking again PT Goal Formulation: With patient Time For Goal Achievement: 03/08/15 Potential to Achieve Goals: Good    Frequency Min 2X/week   Barriers to discharge        Co-evaluation               End of Session   Activity Tolerance: Patient limited by pain Patient left: in bed;with call bell/phone within reach;with bed alarm set;with SCD's reapplied Nurse Communication: Mobility status;Need for lift equipment;Other (comment) (chest pain)         Time: 1610-9604 PT Time Calculation (min) (ACUTE ONLY): 26 min   Charges:   PT Evaluation $Initial PT Evaluation Tier I: 1 Procedure PT Treatments $Therapeutic Activity: 8-22 mins   PT G Codes:        Berton Mount 02/22/2015, 6:07 PM  [place], [place] [phone]

## 2015-02-22 NOTE — Progress Notes (Signed)
CRITICAL VALUE ALERT  Critical value received:  Troponin 1.24 Date of notification:  02/22/15  Time of notification:11:46 am  Critical value read back:Yes.    Nurse who received alert:  Erick AlleySarah Trevon Strothers, RN  MD notified (1st page):  Natale MilchLancaster  Time of first page:  11:50am   Responding MD:  Dr. Natale MilchLancaster  Time MD responded:  12:10pm

## 2015-02-23 DIAGNOSIS — I5031 Acute diastolic (congestive) heart failure: Secondary | ICD-10-CM

## 2015-02-23 DIAGNOSIS — R0602 Shortness of breath: Secondary | ICD-10-CM | POA: Diagnosis not present

## 2015-02-23 DIAGNOSIS — R079 Chest pain, unspecified: Secondary | ICD-10-CM

## 2015-02-23 DIAGNOSIS — I25110 Atherosclerotic heart disease of native coronary artery with unstable angina pectoris: Secondary | ICD-10-CM

## 2015-02-23 DIAGNOSIS — D500 Iron deficiency anemia secondary to blood loss (chronic): Secondary | ICD-10-CM | POA: Diagnosis not present

## 2015-02-23 DIAGNOSIS — I82409 Acute embolism and thrombosis of unspecified deep veins of unspecified lower extremity: Secondary | ICD-10-CM | POA: Diagnosis not present

## 2015-02-23 DIAGNOSIS — I509 Heart failure, unspecified: Secondary | ICD-10-CM

## 2015-02-23 LAB — CBC
HCT: 28 % — ABNORMAL LOW (ref 36.0–46.0)
HEMOGLOBIN: 8.8 g/dL — AB (ref 12.0–15.0)
MCH: 26.2 pg (ref 26.0–34.0)
MCHC: 31.4 g/dL (ref 30.0–36.0)
MCV: 83.3 fL (ref 78.0–100.0)
Platelets: 279 10*3/uL (ref 150–400)
RBC: 3.36 MIL/uL — AB (ref 3.87–5.11)
RDW: 16.9 % — ABNORMAL HIGH (ref 11.5–15.5)
WBC: 8.1 10*3/uL (ref 4.0–10.5)

## 2015-02-23 LAB — BASIC METABOLIC PANEL
Anion gap: 6 (ref 5–15)
BUN: 24 mg/dL — ABNORMAL HIGH (ref 6–20)
CHLORIDE: 107 mmol/L (ref 101–111)
CO2: 23 mmol/L (ref 22–32)
CREATININE: 1.22 mg/dL — AB (ref 0.44–1.00)
Calcium: 8.3 mg/dL — ABNORMAL LOW (ref 8.9–10.3)
GFR calc non Af Amer: 41 mL/min — ABNORMAL LOW (ref >60)
GFR, EST AFRICAN AMERICAN: 47 mL/min — AB (ref >60)
Glucose, Bld: 111 mg/dL — ABNORMAL HIGH (ref 65–99)
POTASSIUM: 3.9 mmol/L (ref 3.5–5.1)
SODIUM: 136 mmol/L (ref 135–145)

## 2015-02-23 LAB — TROPONIN I: TROPONIN I: 1.11 ng/mL — AB (ref <0.031)

## 2015-02-23 MED ORDER — ASPIRIN EC 81 MG PO TBEC
81.0000 mg | DELAYED_RELEASE_TABLET | Freq: Every day | ORAL | Status: DC
  Administered 2015-02-23: 81 mg via ORAL
  Filled 2015-02-23: qty 1

## 2015-02-23 MED ORDER — ISOSORBIDE DINITRATE 10 MG PO TABS: 10.0000 mg | ORAL_TABLET | Freq: Three times a day (TID) | ORAL | Status: DC

## 2015-02-23 NOTE — Progress Notes (Signed)
Patient ID: Karina Willis, female   DOB: 02/19/1933, 81 y.o.   MRN: [id number]    Subjective:  Denies SSCP, palpitations or Dyspnea Not needing oxygen  Objective:  Filed Vitals:   02/22/15 1725 02/22/15 2135 02/22/15 2333 02/23/15 0610  BP: 103/59 111/60  119/71  Pulse: 73 78 76 75  Temp:  98.4 F (36.9 C)  97.7 F (36.5 C)  TempSrc:  Oral  Oral  Resp:  18  18  Height:      Weight:      SpO2: 100% 100% 100% 99%    Intake/Output from previous day:  Intake/Output Summary (Last 24 hours) at 02/23/15 0816 Last data filed at 02/22/15 1333  Gross per 24 hour  Intake    360 ml  Output      0 ml  Net    360 ml    Physical Exam: Affect appropriate Chronically ill black female  HEENT: normal Neck supple with no adenopathy JVP normal no bruits no thyromegaly Lungs poor inspiratory effort crackles at base  Heart:  S1/S2 no murmur, no rub, gallop or click PMI normal Abdomen: benighn, BS positve, no tenderness, no AAA no bruit.  No HSM or HJR Distal pulses hard to palpate s/p left fem pop bypass  Plus one edema Neuro non-focal Skin warm and dry No muscular weakness   Lab Results: Basic Metabolic Panel:  Recent Labs  40/98/1112/14/16 1020 02/23/15 0129  NA 137 136  K 4.2 3.9  CL 107 107  CO2 23 23  GLUCOSE 119* 111*  BUN 23* 24*  CREATININE 1.27* 1.22*  CALCIUM 8.8* 8.3*   Liver Function Tests:  Recent Labs  02/22/15 1020  AST 21  ALT 13*  ALKPHOS 46  BILITOT 1.4*  PROT 6.2*  ALBUMIN 2.8*   No results for input(s): LIPASE, AMYLASE in the last 72 hours. CBC:  Recent Labs  02/21/15 1353  02/22/15 1020 02/23/15 0129  WBC 7.1  < > 7.2 8.1  NEUTROABS 4.5  --  4.7  --   HGB 6.1*  < > 9.8* 8.8*  HCT 20.5*  < > 31.0* 28.0*  MCV 88.0  < > 84.9 83.3  PLT 328  < > 275 279  < > = values in this interval not displayed. Cardiac Enzymes:  Recent Labs  02/22/15 1330 02/22/15 1816 02/23/15 0129  TROPONINI 1.07* 1.10* 1.11*    Imaging: Dg Chest 2  View  02/22/2015  CLINICAL DATA:  Left-sided chest pain EXAM: CHEST  2 VIEW COMPARISON:  February 21, 2015 FINDINGS: There is consolidation throughout the left lower lobe with left effusion. Right lung is clear. There is cardiomegaly with pulmonary vascularity within normal limits. No adenopathy. There is advanced arthropathy in both shoulders with extensive remodeling of the left humeral head. There is a Hill-Sachs defect on the right. Bones are osteoporotic. IMPRESSION: Left lower lobe consolidation with left effusion. Stable cardiomegaly. Advanced arthropathy in both shoulders. Electronically Signed   By: Bretta BangWilliam  Woodruff III M.D.   On: 02/22/2015 20:23   Dg Chest 2 View  02/21/2015  CLINICAL DATA:  Shortness of breath for 3 days EXAM: CHEST - 2 VIEW COMPARISON:  09/23/2014 FINDINGS: Cardiac shadow is mildly enlarged. The right jugular line is been removed in the interval. The right lung remains clear. Some slight increased density is noted projecting in the left lung base consistent with small effusion. Small posterior right-sided effusion is noted as well. Chronic degenerative changes of the shoulder joints are noted  bilaterally. IMPRESSION: Bilateral pleural effusions left greater than right. Electronically Signed   By: Alcide Clever M.D.   On: 02/21/2015 14:35    Cardiac Studies:  ECG:  Orders placed or performed during the hospital encounter of 02/21/15  . ED EKG  . ED EKG  . EKG 12-Lead  . EKG 12-Lead  . EKG 12-Lead  . EKG 12-Lead  . EKG 12-Lead  . EKG 12-Lead  . EKG     Telemetry:  NSR  02/23/2015   Echo:  08/15/14  Reviewed  - LVEF 55-60%, mild focal basal septal hypertrophy, diastolic dysfunction, indeterminate LV filling pressure, moderately dilated RV with normal systolic function, mildly dilated LA, moderate TR and PR with severe pulmonary hypertension (RVSP 75 mmHG), with an elevated RA pressure of 8 mmHg.  Medications:   . atorvastatin  20 mg Oral q1800  .  folic acid  1 mg Oral Daily  . furosemide  20 mg Oral Daily  . hydrocerin  1 application Topical BID  . isosorbide dinitrate  10 mg Oral TID  . sodium chloride  3 mL Intravenous Q12H  . vitamin B-12  1,000 mcg Oral Daily       Assessment/Plan:  CAD:  No revascularization per Dr Swaziland cath 05/2014  Continue medical Rx flat troponin curve with no acute ECG changes Diastolic CHF:  Continue lasix  Nitrates added for preload reduction and pulmonary HTN 3rd spacing some from low albumin    Charlton Haws 02/23/2015, 8:16 AM

## 2015-02-23 NOTE — Progress Notes (Signed)
Patient will DC [facility]: [facility] Anticipated DC date: 02/23/15 Family notified: Niece, Publishing rights managerllenor Transport by: PTAR  CSW signing off.  Karina GoldmannNadia Janeisha Willis, [place] Clinical Social Worker [phone]

## 2015-02-23 NOTE — Progress Notes (Signed)
Pt prepared for d/c to SNF. IV d/c'd. Skin intact except as charted in most recent assessments. Vitals are stable. Report called to receiving facility. Pt to be transported by ambulance service. 

## 2015-02-23 NOTE — [place] FL2 (Signed)
[place] MEDICAID FL2 LEVEL OF CARE SCREENING TOOL     IDENTIFICATION  Patient Name: Karina Willis Birthdate: 13-Feb-1933 Sex: female Admission Date (Current Location): 02/21/2015  [facility] and [place] Number: Producer, television/film/video and Address:  [facility], [address], [place], [place] [address]      Provider Number: [id number]  Attending Physician Name and Address:  Nestor Ramp, MD  Relative Name and Phone Number:  Ellenor, Niece,[phone]    Current Level of Care: Hospital Recommended Level of Care: Skilled Nursing Facility Prior Approval Number:    Date Approved/Denied:   PASRR Number:    Discharge Plan: SNF    Current Diagnoses: Patient Active Problem List   Diagnosis Date Noted  . Elevated troponin 02/22/2015  . Acute diastolic heart failure (HCC) 02/22/2015  . Symptomatic anemia   . Pulmonary HTN (HCC) 10/31/2014  . Severe peripheral arterial disease (HCC) 09/28/2014  . Pressure ulcer 09/23/2014  . B12 deficiency 08/15/2014  . Moderate malnutrition (HCC) 08/15/2014  . Prediabetes 08/15/2014  . Cellulitis of great toe of left foot 08/15/2014  . Hypotension 08/15/2014  . AKI (acute kidney injury) (HCC) 08/14/2014  . Hypovolemia 08/14/2014  . Anemia 08/03/2013  . Bilateral hip pain 08/01/2013  . Fall at home 08/01/2013  . Chronic diastolic CHF (congestive heart failure) (HCC)   . Mitral insufficiency   . Coronary artery disease   . SOB (shortness of breath)   . History of TIAs   . Arthritis   . B12 deficiency anemia 04/24/2009  . UNSPECIFIED VITAMIN D DEFICIENCY 06/13/2008  . Chronic kidney disease (CKD), stage III (moderate) 06/13/2008  . POSTMENOPAUSAL STATUS 06/03/2008  . KNEE PAIN, LEFT 12/03/2007  . HYPERCHOLESTEROLEMIA 05/08/2006  . OBESITY, NOS 05/08/2006  . HYPERTENSION, BENIGN SYSTEMIC 05/08/2006  . COR PULMONALE 05/08/2006  . Mitral valve disorder 05/08/2006  . OSTEOARTHRITIS, MULTI SITES 05/08/2006     Orientation RESPIRATION BLADDER Height & Weight    Self, Time, Situation, Place  Normal Incontinent   182 lbs.  BEHAVIORAL SYMPTOMS/MOOD NEUROLOGICAL BOWEL NUTRITION STATUS      Continent  (See DC Summary)  AMBULATORY STATUS COMMUNICATION OF NEEDS Skin   Limited Assist Verbally Normal                       Personal Care Assistance Level of Assistance  Bathing, Feeding, Dressing Bathing Assistance: Maximum assistance Feeding assistance: Limited assistance Dressing Assistance: Maximum assistance     Functional Limitations Info             SPECIAL CARE FACTORS FREQUENCY  PT (By licensed PT)     PT Frequency: 3x/week              Contractures      Additional Factors Info  Code Status, Allergies Code Status Info: Full Allergies Info: Penicillins           Current Medications (02/23/2015):  This is the current hospital active medication list Current Facility-Administered Medications  Medication Dose Route Frequency Provider Last Rate Last Dose  . 0.9 %  sodium chloride infusion  250 mL Intravenous PRN Narda Bonds, MD      . acetaminophen (TYLENOL) tablet 650 mg  650 mg Oral Q6H PRN Narda Bonds, MD   650 mg at 02/23/15 1209  . aspirin EC tablet 81 mg  81 mg Oral Daily Marquette Saa, MD   81 mg at 02/23/15 1208  . atorvastatin (LIPITOR)  tablet 20 mg  20 mg Oral q1800 Narda Bondsalph A Nettey, MD   20 mg at 02/22/15 1815  . folic acid (FOLVITE) tablet 1 mg  1 mg Oral Daily Narda Bondsalph A Nettey, MD   1 mg at 02/23/15 [id number]  . furosemide (LASIX) tablet 20 mg  20 mg Oral Daily Janetta HoraKathryn R Thompson, PA-C   20 mg at 02/23/15 0950  . hydrocerin (EUCERIN) cream 1 application  1 application Topical BID Narda Bondsalph A Nettey, MD   1 application at 02/23/15 [phone] [id number]  . isosorbide dinitrate (ISORDIL) tablet 10 mg  10 mg Oral TID Janetta HoraKathryn R Thompson, PA-C   10 mg at 02/23/15 0950  . nitroGLYCERIN (NITROSTAT) SL tablet 0.4 mg  0.4 mg Sublingual Q5 min PRN Casey BurkittHillary Moen Fitzgerald, MD    0.4 mg at 02/22/15 2151  . senna-docusate (Senokot-S) tablet 1 tablet  1 tablet Oral QHS PRN Narda Bondsalph A Nettey, MD      . sodium chloride 0.9 % injection 3 mL  3 mL Intravenous Q12H Narda Bondsalph A Nettey, MD   3 mL at 02/22/15 2122  . sodium chloride 0.9 % injection 3 mL  3 mL Intravenous PRN Narda Bondsalph A Nettey, MD      . vitamin B-12 (CYANOCOBALAMIN) tablet 1,000 mcg  1,000 mcg Oral Daily Nestor RampSara L Neal, MD   1,000 mcg at 02/23/15 [id number]     Discharge Medications: Please see discharge summary for a list of discharge medications.  Relevant Imaging Results:  Relevant Lab Results:   Additional Information    Mearl LatinNadia S Ashtyn Meland, LCSWA

## 2015-02-23 NOTE — Clinical Social Work Note (Signed)
Clinical Social Work Assessment  Patient Details  Name: Karina Willis MRN: [id number] Date of Birth: 06-08-1932  Date of referral:  02/23/15               Reason for consult:  Facility Placement                Permission sought to share information with:  Facility Art therapist granted to share information::  Yes, Verbal Permission Granted  Name::     Scientist, product/process development::  [place]  Relationship::  Niece  Contact Information:  [phone]  Housing/Transportation Living arrangements for the past 2 months:  [place] of Information:  Patient Patient Interpreter Needed:  None Criminal Activity/Legal Involvement Pertinent to Current Situation/Hospitalization:  No - Comment as needed Significant Relationships:  Other Family Members Lives with:  Self Do you feel safe going back to the place where you live?  No Need for family participation in patient care:  No (Coment)  Care giving concerns:  CSW received referral for possible SNF placement at time of discharge. CSW met with patient regarding PT recommendation of SNF placement at time of discharge. Pt would like to return to [facility]. Patient expressed understanding of PT recommendation and is agreeable to SNF placement at time of discharge. CSW to continue to follow and assist with discharge planning needs.   Social Worker assessment / plan:  CSW spoke with patient concerning possibility of rehab at [facility] before returning home.  Employment status:  Retired Nurse, adult PT Recommendations:  [place] / Referral to community resources:  [place]  Patient/Family's Response to care:  Patient  recognizes need for rehab before returning home and is agreeable to returning to [facility].  Patient/Family's Understanding of and Emotional Response to Diagnosis, Current Treatment, and Prognosis:  Patient is  realistic regarding therapy needs. No questions/concerns about plan or treatment.    Emotional Assessment Appearance:  Appears stated age Attitude/Demeanor/Rapport:    Affect (typically observed):  Accepting, Appropriate Orientation:  Oriented to Self, Oriented to Place, Oriented to  Time, Oriented to Situation Alcohol / Substance use:  Not Applicable Psych involvement (Current and /or in the community):  No (Comment)  Discharge Needs  Concerns to be addressed:  No discharge needs identified Readmission within the last 30 days:  No Current discharge risk:  None Barriers to Discharge:  No Barriers Identified   Benard Halsted, [place] 02/23/2015, 12:43 PM

## 2015-02-27 DIAGNOSIS — R109 Unspecified abdominal pain: Secondary | ICD-10-CM | POA: Diagnosis not present

## 2015-03-02 ENCOUNTER — Encounter: Payer: Medicare Other | Admitting: Physician Assistant

## 2015-03-02 DIAGNOSIS — R0989 Other specified symptoms and signs involving the circulatory and respiratory systems: Secondary | ICD-10-CM

## 2015-03-02 DIAGNOSIS — I2510 Atherosclerotic heart disease of native coronary artery without angina pectoris: Secondary | ICD-10-CM | POA: Diagnosis not present

## 2015-03-02 DIAGNOSIS — D500 Iron deficiency anemia secondary to blood loss (chronic): Secondary | ICD-10-CM | POA: Diagnosis not present

## 2015-03-02 DIAGNOSIS — I5030 Unspecified diastolic (congestive) heart failure: Secondary | ICD-10-CM | POA: Diagnosis not present

## 2015-03-02 DIAGNOSIS — I272 Other secondary pulmonary hypertension: Secondary | ICD-10-CM | POA: Diagnosis not present

## 2015-03-02 NOTE — Progress Notes (Signed)
    Cardiology Office Note   Date:  03/02/2015   ID:  Karina Willis, DOB 09/26/1932, MRN [id number]  PCP:  Peter SwazilandJordan, MD  Cardiologist:  Dr SwazilandJordan  [name], [name], PA-C   No chief complaint on file.   History of Present Illness: Karina Willis is a 81 y.o. female with a history of ICM>EF normalized, severe pulmonary HTN, moderate to severe MR, D-CHF, RCA & CFX 100%, PAD w/ L fem-pop, HTN, HL, TIA.  Admitted 12/14-12/15 w/ symptomatic anemia after dental extractions, and was on Xarelto. Seen by cards for elevated troponin, felt demand ischemia, need for anticoag and volume overload. Taken off Xarelto as no clear reason to be on it. Weight 182 lbs.

## 2015-03-06 DIAGNOSIS — D649 Anemia, unspecified: Secondary | ICD-10-CM | POA: Diagnosis not present

## 2015-03-10 DIAGNOSIS — D649 Anemia, unspecified: Secondary | ICD-10-CM | POA: Diagnosis not present

## 2015-03-15 DIAGNOSIS — D649 Anemia, unspecified: Secondary | ICD-10-CM | POA: Diagnosis not present

## 2015-03-20 DIAGNOSIS — D649 Anemia, unspecified: Secondary | ICD-10-CM | POA: Diagnosis not present

## 2015-03-22 DIAGNOSIS — R079 Chest pain, unspecified: Secondary | ICD-10-CM | POA: Diagnosis not present

## 2015-03-22 DIAGNOSIS — R062 Wheezing: Secondary | ICD-10-CM | POA: Diagnosis not present

## 2015-03-22 DIAGNOSIS — R600 Localized edema: Secondary | ICD-10-CM | POA: Diagnosis not present

## 2015-03-22 DIAGNOSIS — R0602 Shortness of breath: Secondary | ICD-10-CM | POA: Diagnosis not present

## 2015-03-22 DIAGNOSIS — R0789 Other chest pain: Secondary | ICD-10-CM | POA: Diagnosis not present

## 2015-03-23 DIAGNOSIS — L02413 Cutaneous abscess of right upper limb: Secondary | ICD-10-CM | POA: Diagnosis not present

## 2015-03-23 DIAGNOSIS — R600 Localized edema: Secondary | ICD-10-CM | POA: Diagnosis not present

## 2015-03-24 ENCOUNTER — Encounter: Payer: Self-pay | Admitting: Vascular Surgery

## 2015-03-27 DIAGNOSIS — D649 Anemia, unspecified: Secondary | ICD-10-CM | POA: Diagnosis not present

## 2015-03-28 ENCOUNTER — Other Ambulatory Visit: Payer: Self-pay | Admitting: Vascular Surgery

## 2015-03-28 ENCOUNTER — Ambulatory Visit (INDEPENDENT_AMBULATORY_CARE_PROVIDER_SITE_OTHER): Payer: Medicare Other | Admitting: Vascular Surgery

## 2015-03-28 ENCOUNTER — Ambulatory Visit (HOSPITAL_COMMUNITY): Admission: RE | Admit: 2015-03-28 | Payer: Medicare Other | Source: Ambulatory Visit

## 2015-03-28 ENCOUNTER — Encounter: Payer: Self-pay | Admitting: Vascular Surgery

## 2015-03-28 ENCOUNTER — Ambulatory Visit (HOSPITAL_COMMUNITY)
Admission: RE | Admit: 2015-03-28 | Discharge: 2015-03-28 | Disposition: A | Discharge status: Home / Self Care | Payer: Medicare Other | Source: Ambulatory Visit | Attending: Vascular Surgery | Admitting: Vascular Surgery

## 2015-03-28 VITALS — BP 123/76 | HR 101 | Temp 98.9°F | Resp 32

## 2015-03-28 DIAGNOSIS — Z9981 Dependence on supplemental oxygen: Secondary | ICD-10-CM | POA: Diagnosis not present

## 2015-03-28 DIAGNOSIS — Z8673 Personal history of transient ischemic attack (TIA), and cerebral infarction without residual deficits: Secondary | ICD-10-CM | POA: Diagnosis not present

## 2015-03-28 DIAGNOSIS — S40021A Contusion of right upper arm, initial encounter: Secondary | ICD-10-CM | POA: Diagnosis not present

## 2015-03-28 DIAGNOSIS — Z79899 Other long term (current) drug therapy: Secondary | ICD-10-CM | POA: Diagnosis not present

## 2015-03-28 DIAGNOSIS — I472 Ventricular tachycardia: Secondary | ICD-10-CM | POA: Diagnosis not present

## 2015-03-28 DIAGNOSIS — N183 Chronic kidney disease, stage 3 (moderate): Secondary | ICD-10-CM | POA: Diagnosis not present

## 2015-03-28 DIAGNOSIS — Z87891 Personal history of nicotine dependence: Secondary | ICD-10-CM | POA: Diagnosis not present

## 2015-03-28 DIAGNOSIS — I10 Essential (primary) hypertension: Secondary | ICD-10-CM | POA: Diagnosis not present

## 2015-03-28 DIAGNOSIS — I509 Heart failure, unspecified: Secondary | ICD-10-CM | POA: Diagnosis not present

## 2015-03-28 DIAGNOSIS — E785 Hyperlipidemia, unspecified: Secondary | ICD-10-CM | POA: Diagnosis not present

## 2015-03-28 DIAGNOSIS — M7989 Other specified soft tissue disorders: Secondary | ICD-10-CM

## 2015-03-28 DIAGNOSIS — Z95828 Presence of other vascular implants and grafts: Secondary | ICD-10-CM | POA: Insufficient documentation

## 2015-03-28 DIAGNOSIS — Z7901 Long term (current) use of anticoagulants: Secondary | ICD-10-CM | POA: Diagnosis not present

## 2015-03-28 DIAGNOSIS — I248 Other forms of acute ischemic heart disease: Secondary | ICD-10-CM | POA: Diagnosis not present

## 2015-03-28 DIAGNOSIS — Z7982 Long term (current) use of aspirin: Secondary | ICD-10-CM | POA: Diagnosis not present

## 2015-03-28 DIAGNOSIS — Z95820 Peripheral vascular angioplasty status with implants and grafts: Secondary | ICD-10-CM | POA: Diagnosis not present

## 2015-03-28 DIAGNOSIS — E875 Hyperkalemia: Secondary | ICD-10-CM | POA: Diagnosis not present

## 2015-03-28 DIAGNOSIS — I739 Peripheral vascular disease, unspecified: Secondary | ICD-10-CM

## 2015-03-28 DIAGNOSIS — R1084 Generalized abdominal pain: Secondary | ICD-10-CM | POA: Diagnosis not present

## 2015-03-28 DIAGNOSIS — Z48812 Encounter for surgical aftercare following surgery on the circulatory system: Secondary | ICD-10-CM

## 2015-03-28 DIAGNOSIS — Z8249 Family history of ischemic heart disease and other diseases of the circulatory system: Secondary | ICD-10-CM | POA: Diagnosis not present

## 2015-03-28 DIAGNOSIS — D649 Anemia, unspecified: Secondary | ICD-10-CM | POA: Diagnosis not present

## 2015-03-28 DIAGNOSIS — J9610 Chronic respiratory failure, unspecified whether with hypoxia or hypercapnia: Secondary | ICD-10-CM | POA: Diagnosis not present

## 2015-03-28 DIAGNOSIS — I959 Hypotension, unspecified: Secondary | ICD-10-CM | POA: Diagnosis not present

## 2015-03-28 DIAGNOSIS — I5043 Acute on chronic combined systolic (congestive) and diastolic (congestive) heart failure: Secondary | ICD-10-CM | POA: Diagnosis not present

## 2015-03-28 DIAGNOSIS — I5022 Chronic systolic (congestive) heart failure: Secondary | ICD-10-CM | POA: Diagnosis not present

## 2015-03-28 DIAGNOSIS — N390 Urinary tract infection, site not specified: Secondary | ICD-10-CM | POA: Diagnosis not present

## 2015-03-28 DIAGNOSIS — D519 Vitamin B12 deficiency anemia, unspecified: Secondary | ICD-10-CM | POA: Diagnosis not present

## 2015-03-28 DIAGNOSIS — R600 Localized edema: Secondary | ICD-10-CM | POA: Diagnosis not present

## 2015-03-28 DIAGNOSIS — I255 Ischemic cardiomyopathy: Secondary | ICD-10-CM | POA: Diagnosis not present

## 2015-03-28 DIAGNOSIS — Z79891 Long term (current) use of opiate analgesic: Secondary | ICD-10-CM | POA: Diagnosis not present

## 2015-03-28 DIAGNOSIS — Z7401 Bed confinement status: Secondary | ICD-10-CM | POA: Diagnosis not present

## 2015-03-28 DIAGNOSIS — R062 Wheezing: Secondary | ICD-10-CM | POA: Diagnosis not present

## 2015-03-28 DIAGNOSIS — I130 Hypertensive heart and chronic kidney disease with heart failure and stage 1 through stage 4 chronic kidney disease, or unspecified chronic kidney disease: Secondary | ICD-10-CM | POA: Diagnosis not present

## 2015-03-28 DIAGNOSIS — R05 Cough: Secondary | ICD-10-CM | POA: Diagnosis not present

## 2015-03-28 DIAGNOSIS — I110 Hypertensive heart disease with heart failure: Secondary | ICD-10-CM | POA: Diagnosis not present

## 2015-03-28 DIAGNOSIS — R0602 Shortness of breath: Secondary | ICD-10-CM | POA: Diagnosis not present

## 2015-03-28 DIAGNOSIS — J189 Pneumonia, unspecified organism: Secondary | ICD-10-CM | POA: Diagnosis not present

## 2015-03-28 DIAGNOSIS — I2510 Atherosclerotic heart disease of native coronary artery without angina pectoris: Secondary | ICD-10-CM | POA: Diagnosis not present

## 2015-03-28 NOTE — Progress Notes (Signed)
Right arm swollen from fingers to elbow, with ecchymosis, inner aspect of right arm.  C/o tenderness of right inner forearm.  Pt. reported the nursing home drew lab work yesterday, prior to the swelling.

## 2015-03-28 NOTE — Progress Notes (Signed)
[facility] [place]  Patient name: Karina Willis MRN: [id number] DOB: Apr 03, 1932 Sex: female  REASON FOR VISIT:  Follow-up left femoropopliteal bypass  HPI: Karina Willis is a 82 y.o. female  HEENT today for follow-up of her left femoral to above-knee popliteal bypass with saphenous vein from July 2016. She had the severe rest pain and tissue loss in her left foot. She has resolved this. She continues to be in a progressively debilitated state. She is a nursing facility and reports that she is a total lift from bed to chair with minimal ability to even stand to transfer. She does have a marked lower extremity edema with severe chronic congestive heart failure. She also notes pain in her right forearm. She has extensive bruising and hematoma associated with the recent blood draw.  Past Medical History  Diagnosis Date  . CHF (congestive heart failure) (HCC)   . Mitral insufficiency   . Coronary artery disease 2001    WITH DOCUMENTED TOTAL OCCLUSION OF THE RIGHT CORONARY AND THE LEFT CIRCUMFLEX CORONARY  . SOB (shortness of breath)   . Hypertension   . Hyperlipidemia   . History of TIAs   . Arthritis   . Non compliance w medication regimen   . Neck pain   . PAD (peripheral artery disease) (HCC)   . Critical lower limb ischemia   . Pulmonary HTN (HCC)     Family History  Problem Relation Age of Onset  . Heart attack Mother   . Heart attack Father   . Heart disease Sister   . Heart attack Brother   . Heart attack Brother   . Heart attack Brother   . Heart disease Sister     SOCIAL HISTORY: Social History  Substance Use Topics  . Smoking status: Former Smoker    Types: Cigarettes    Quit date: 08/21/1983  . Smokeless tobacco: Never Used  . Alcohol Use: No    Allergies  Allergen Reactions  . Penicillins Hives and Itching    Pt reports not being allergic to penicillin Tolerates Ancef    Current Outpatient Prescriptions  Medication Sig Dispense  Refill  . acetaminophen (TYLENOL) 325 MG tablet Take 2 tablets (650 mg total) by mouth every 6 (six) hours as needed for moderate pain.    Marland Kitchen albuterol (PROVENTIL) (2.5 MG/3ML) 0.083% nebulizer solution Take 2.5 mg by nebulization every 6 (six) hours as needed for wheezing or shortness of breath.    Marland Kitchen aspirin EC 81 MG EC tablet Take 1 tablet (81 mg total) by mouth daily.    Marland Kitchen aspirin-acetaminophen-caffeine (EXCEDRIN MIGRAINE) 250-250-65 MG per tablet Take 1 tablet by mouth every 6 (six) hours as needed for headache.    Marland Kitchen atorvastatin (LIPITOR) 20 MG tablet Take 20 mg by mouth daily.    Marland Kitchen docusate sodium (COLACE) 100 MG capsule Take 1 capsule (100 mg total) by mouth 2 (two) times daily as needed for mild constipation. 10 capsule 0  . folic acid (FOLVITE) 1 MG tablet Take 1 tablet (1 mg total) by mouth daily.    . furosemide (LASIX) 20 MG tablet Take 20 mg by mouth daily.    . hydrocerin (EUCERIN) CREA Apply 1 application topically 2 (two) times daily. 454 g 3  . isosorbide dinitrate (ISORDIL) 10 MG tablet Take 1 tablet (10 mg total) by mouth 3 (three) times daily. 90 tablet 1  . LORazepam (ATIVAN) 0.5 MG tablet Take 0.5 mg by mouth every 12 (twelve)  hours as needed for anxiety.    . nitroGLYCERIN (NITROSTAT) 0.4 MG SL tablet Place 1 tablet (0.4 mg total) under the tongue every 5 (five) minutes as needed for chest pain. Must keep appointment 10/31/14 with Dr Swaziland 25 tablet 5  . oxycodone (OXY-IR) 5 MG capsule Take 5 mg by mouth every 4 (four) hours as needed for pain.    Marland Kitchen oxyCODONE-acetaminophen (PERCOCET/ROXICET) 5-325 MG per tablet Take 1 tablet by mouth every 6 (six) hours as needed for moderate pain. 30 tablet 0  . phenol (CHLORASEPTIC) 1.4 % LIQD Use as directed 1 spray in the mouth or throat as needed for throat irritation / pain.  0  . polyethylene glycol (MIRALAX / GLYCOLAX) packet Take 17 g by mouth daily as needed. 14 each 0  . Potassium Chloride (KLOR-CON 10 PO) Take 10 mg by mouth daily.     . vitamin B-12 1000 MCG tablet Take 1 tablet (1,000 mcg total) by mouth daily. 30 tablet 3  . LORazepam (ATIVAN) 2 MG/ML concentrated solution Place 0.5 mg into feeding tube once. Reported on 03/28/2015     No current facility-administered medications for this visit.    REVIEW OF SYSTEMS:   denotes positive finding,  denotes negative finding Cardiac  Comments:  Chest pain or chest pressure:    Shortness of breath upon exertion: x   Short of breath when lying flat:    Irregular heart rhythm:        Vascular    Pain in calf, thigh, or hip brought on by ambulation:    Pain in feet at night that wakes you up from your sleep:     Blood clot in your veins:    Leg swelling:         Pulmonary    Oxygen at home:    Productive cough:     Wheezing:         Neurologic    Sudden weakness in arms or legs:     Sudden numbness in arms or legs:     Sudden onset of difficulty speaking or slurred speech:    Temporary loss of vision in one eye:     Problems with dizziness:         Gastrointestinal    Blood in stool:     Vomited blood:         Genitourinary    Burning when urinating:     Blood in urine:        Psychiatric    Major depression:         Hematologic    Bleeding problems:    Problems with blood clotting too easily:        Skin    Rashes or ulcers:        Constitutional    Fever or chills:      PHYSICAL EXAM: Filed Vitals:   03/28/15 1332  BP: 123/76  Pulse: 101  Temp: 98.9 F (37.2 C)  TempSrc: Oral  Resp: 32    GENERAL: The patient is a well-nourished female, in no acute distress. The vital signs are documented above. Lying on a stretcher. VASCULAR:  Palpable radial pulses bilaterally. Do not palpate pedal pulses bilaterally. PULMONARY: There is good air exchange  ABDOMEN: Soft and non-tender MUSCULOSKELETAL: There are no major deformities or cyanosis. NEUROLOGIC: No focal weakness or paresthesias are detected. SKIN: There are no ulcers or rashes  noted. Does have removal of her left great toenail bed with no  open wound and no infection PSYCHIATRIC: The patient has a normal affect.  marked bilateral lower extremity pitting edema DATA:  Noninvasive studies today reveal patency of her left femoropopliteal bypass. She is unable to tolerate the blood pressure cuff her ankle arm indices. Graft scan shows triphasic flow throughout her bypass and tibial vessels.  MEDICAL ISSUES: Stable overall status post left femoropopliteal bypass for limb salvage in July 2016. She was reassured with the studies. She has had marked clinical deterioration does not stand and is total assist from bed to chair. Would not recommend any further noninvasive follow-up unless new difficulty. We'll see her again on an as-needed basis No Follow-up on file.   Gretta Began [facility]  [place]: [phone]

## 2015-03-29 DIAGNOSIS — I5022 Chronic systolic (congestive) heart failure: Secondary | ICD-10-CM | POA: Diagnosis not present

## 2015-03-29 DIAGNOSIS — R05 Cough: Secondary | ICD-10-CM | POA: Diagnosis not present

## 2015-03-29 DIAGNOSIS — R0602 Shortness of breath: Secondary | ICD-10-CM | POA: Diagnosis not present

## 2015-03-29 DIAGNOSIS — J189 Pneumonia, unspecified organism: Secondary | ICD-10-CM | POA: Diagnosis not present

## 2015-03-29 DIAGNOSIS — R600 Localized edema: Secondary | ICD-10-CM | POA: Diagnosis not present

## 2015-03-30 ENCOUNTER — Inpatient Hospital Stay (HOSPITAL_COMMUNITY)
Admission: EM | Admit: 2015-03-30 | Discharge: 2015-04-07 | DRG: 291 | Disposition: A | Discharge status: Skilled Nursing Facility | Payer: Medicare Other | Attending: Internal Medicine | Admitting: Internal Medicine

## 2015-03-30 ENCOUNTER — Emergency Department (HOSPITAL_COMMUNITY): Payer: Medicare Other

## 2015-03-30 ENCOUNTER — Encounter (HOSPITAL_COMMUNITY): Payer: Self-pay | Admitting: Internal Medicine

## 2015-03-30 DIAGNOSIS — Z9981 Dependence on supplemental oxygen: Secondary | ICD-10-CM | POA: Diagnosis not present

## 2015-03-30 DIAGNOSIS — Y95 Nosocomial condition: Secondary | ICD-10-CM | POA: Diagnosis present

## 2015-03-30 DIAGNOSIS — J9610 Chronic respiratory failure, unspecified whether with hypoxia or hypercapnia: Secondary | ICD-10-CM | POA: Diagnosis present

## 2015-03-30 DIAGNOSIS — I130 Hypertensive heart and chronic kidney disease with heart failure and stage 1 through stage 4 chronic kidney disease, or unspecified chronic kidney disease: Principal | ICD-10-CM | POA: Diagnosis present

## 2015-03-30 DIAGNOSIS — Z79891 Long term (current) use of opiate analgesic: Secondary | ICD-10-CM | POA: Diagnosis not present

## 2015-03-30 DIAGNOSIS — S40021A Contusion of right upper arm, initial encounter: Secondary | ICD-10-CM | POA: Diagnosis present

## 2015-03-30 DIAGNOSIS — Z515 Encounter for palliative care: Secondary | ICD-10-CM | POA: Insufficient documentation

## 2015-03-30 DIAGNOSIS — I82409 Acute embolism and thrombosis of unspecified deep veins of unspecified lower extremity: Secondary | ICD-10-CM | POA: Diagnosis present

## 2015-03-30 DIAGNOSIS — I959 Hypotension, unspecified: Secondary | ICD-10-CM | POA: Diagnosis present

## 2015-03-30 DIAGNOSIS — Z7401 Bed confinement status: Secondary | ICD-10-CM | POA: Diagnosis not present

## 2015-03-30 DIAGNOSIS — I509 Heart failure, unspecified: Secondary | ICD-10-CM

## 2015-03-30 DIAGNOSIS — Z87891 Personal history of nicotine dependence: Secondary | ICD-10-CM

## 2015-03-30 DIAGNOSIS — N390 Urinary tract infection, site not specified: Secondary | ICD-10-CM | POA: Diagnosis present

## 2015-03-30 DIAGNOSIS — Z8249 Family history of ischemic heart disease and other diseases of the circulatory system: Secondary | ICD-10-CM | POA: Diagnosis not present

## 2015-03-30 DIAGNOSIS — I5031 Acute diastolic (congestive) heart failure: Secondary | ICD-10-CM | POA: Diagnosis not present

## 2015-03-30 DIAGNOSIS — Z7901 Long term (current) use of anticoagulants: Secondary | ICD-10-CM | POA: Diagnosis not present

## 2015-03-30 DIAGNOSIS — R05 Cough: Secondary | ICD-10-CM | POA: Diagnosis not present

## 2015-03-30 DIAGNOSIS — I2510 Atherosclerotic heart disease of native coronary artery without angina pectoris: Secondary | ICD-10-CM | POA: Diagnosis present

## 2015-03-30 DIAGNOSIS — I5043 Acute on chronic combined systolic (congestive) and diastolic (congestive) heart failure: Secondary | ICD-10-CM | POA: Diagnosis present

## 2015-03-30 DIAGNOSIS — E875 Hyperkalemia: Secondary | ICD-10-CM | POA: Diagnosis not present

## 2015-03-30 DIAGNOSIS — Z7982 Long term (current) use of aspirin: Secondary | ICD-10-CM | POA: Diagnosis not present

## 2015-03-30 DIAGNOSIS — Z8673 Personal history of transient ischemic attack (TIA), and cerebral infarction without residual deficits: Secondary | ICD-10-CM

## 2015-03-30 DIAGNOSIS — Z7189 Other specified counseling: Secondary | ICD-10-CM | POA: Insufficient documentation

## 2015-03-30 DIAGNOSIS — E669 Obesity, unspecified: Secondary | ICD-10-CM | POA: Diagnosis present

## 2015-03-30 DIAGNOSIS — I10 Essential (primary) hypertension: Secondary | ICD-10-CM | POA: Diagnosis present

## 2015-03-30 DIAGNOSIS — Z66 Do not resuscitate: Secondary | ICD-10-CM | POA: Diagnosis not present

## 2015-03-30 DIAGNOSIS — N183 Chronic kidney disease, stage 3 unspecified: Secondary | ICD-10-CM | POA: Diagnosis present

## 2015-03-30 DIAGNOSIS — R7989 Other specified abnormal findings of blood chemistry: Secondary | ICD-10-CM | POA: Diagnosis present

## 2015-03-30 DIAGNOSIS — I739 Peripheral vascular disease, unspecified: Secondary | ICD-10-CM | POA: Diagnosis present

## 2015-03-30 DIAGNOSIS — Z86718 Personal history of other venous thrombosis and embolism: Secondary | ICD-10-CM

## 2015-03-30 DIAGNOSIS — D649 Anemia, unspecified: Secondary | ICD-10-CM | POA: Diagnosis not present

## 2015-03-30 DIAGNOSIS — X58XXXA Exposure to other specified factors, initial encounter: Secondary | ICD-10-CM | POA: Diagnosis present

## 2015-03-30 DIAGNOSIS — I248 Other forms of acute ischemic heart disease: Secondary | ICD-10-CM | POA: Diagnosis present

## 2015-03-30 DIAGNOSIS — Z6838 Body mass index (BMI) 38.0-38.9, adult: Secondary | ICD-10-CM

## 2015-03-30 DIAGNOSIS — M7989 Other specified soft tissue disorders: Secondary | ICD-10-CM | POA: Diagnosis present

## 2015-03-30 DIAGNOSIS — R52 Pain, unspecified: Secondary | ICD-10-CM | POA: Diagnosis not present

## 2015-03-30 DIAGNOSIS — I110 Hypertensive heart disease with heart failure: Secondary | ICD-10-CM | POA: Diagnosis not present

## 2015-03-30 DIAGNOSIS — Z79899 Other long term (current) drug therapy: Secondary | ICD-10-CM | POA: Diagnosis not present

## 2015-03-30 DIAGNOSIS — I5020 Unspecified systolic (congestive) heart failure: Secondary | ICD-10-CM | POA: Diagnosis not present

## 2015-03-30 DIAGNOSIS — R51 Headache: Secondary | ICD-10-CM | POA: Diagnosis not present

## 2015-03-30 DIAGNOSIS — I519 Heart disease, unspecified: Secondary | ICD-10-CM

## 2015-03-30 DIAGNOSIS — I255 Ischemic cardiomyopathy: Secondary | ICD-10-CM | POA: Diagnosis present

## 2015-03-30 DIAGNOSIS — E785 Hyperlipidemia, unspecified: Secondary | ICD-10-CM | POA: Diagnosis present

## 2015-03-30 DIAGNOSIS — I252 Old myocardial infarction: Secondary | ICD-10-CM

## 2015-03-30 DIAGNOSIS — E876 Hypokalemia: Secondary | ICD-10-CM | POA: Diagnosis present

## 2015-03-30 DIAGNOSIS — M79601 Pain in right arm: Secondary | ICD-10-CM | POA: Diagnosis not present

## 2015-03-30 DIAGNOSIS — T502X5A Adverse effect of carbonic-anhydrase inhibitors, benzothiadiazides and other diuretics, initial encounter: Secondary | ICD-10-CM | POA: Diagnosis not present

## 2015-03-30 DIAGNOSIS — R0602 Shortness of breath: Secondary | ICD-10-CM | POA: Diagnosis not present

## 2015-03-30 DIAGNOSIS — J189 Pneumonia, unspecified organism: Secondary | ICD-10-CM | POA: Diagnosis present

## 2015-03-30 DIAGNOSIS — R778 Other specified abnormalities of plasma proteins: Secondary | ICD-10-CM | POA: Diagnosis present

## 2015-03-30 DIAGNOSIS — I472 Ventricular tachycardia: Secondary | ICD-10-CM | POA: Diagnosis not present

## 2015-03-30 DIAGNOSIS — I825Z3 Chronic embolism and thrombosis of unspecified deep veins of distal lower extremity, bilateral: Secondary | ICD-10-CM

## 2015-03-30 DIAGNOSIS — I340 Nonrheumatic mitral (valve) insufficiency: Secondary | ICD-10-CM | POA: Diagnosis present

## 2015-03-30 DIAGNOSIS — Z452 Encounter for adjustment and management of vascular access device: Secondary | ICD-10-CM | POA: Diagnosis not present

## 2015-03-30 LAB — CBC WITH DIFFERENTIAL/PLATELET
Basophils Absolute: 0 10*3/uL (ref 0.0–0.1)
Basophils Relative: 0 %
EOS ABS: 0.1 10*3/uL (ref 0.0–0.7)
EOS PCT: 1 %
HCT: 31.3 % — ABNORMAL LOW (ref 36.0–46.0)
Hemoglobin: 9 g/dL — ABNORMAL LOW (ref 12.0–15.0)
LYMPHS ABS: 1 10*3/uL (ref 0.7–4.0)
Lymphocytes Relative: 9 %
MCH: 25.2 pg — AB (ref 26.0–34.0)
MCHC: 28.8 g/dL — AB (ref 30.0–36.0)
MCV: 87.7 fL (ref 78.0–100.0)
Monocytes Absolute: 1.5 10*3/uL — ABNORMAL HIGH (ref 0.1–1.0)
Monocytes Relative: 13 %
Neutro Abs: 8.6 10*3/uL — ABNORMAL HIGH (ref 1.7–7.7)
Neutrophils Relative %: 77 %
PLATELETS: 216 10*3/uL (ref 150–400)
RBC: 3.57 MIL/uL — AB (ref 3.87–5.11)
RDW: 19.1 % — AB (ref 11.5–15.5)
WBC: 11.1 10*3/uL — AB (ref 4.0–10.5)

## 2015-03-30 LAB — BASIC METABOLIC PANEL
Anion gap: 7 (ref 5–15)
BUN: 21 mg/dL — AB (ref 6–20)
CHLORIDE: 108 mmol/L (ref 101–111)
CO2: 30 mmol/L (ref 22–32)
CREATININE: 0.99 mg/dL (ref 0.44–1.00)
Calcium: 8.9 mg/dL (ref 8.9–10.3)
GFR calc Af Amer: >60 mL/min (ref >60)
GFR calc non Af Amer: 52 mL/min — ABNORMAL LOW (ref >60)
Glucose, Bld: 140 mg/dL — ABNORMAL HIGH (ref 65–99)
Potassium: 4.2 mmol/L (ref 3.5–5.1)
SODIUM: 145 mmol/L (ref 135–145)

## 2015-03-30 LAB — I-STAT TROPONIN, ED: TROPONIN I, POC: 0.13 ng/mL — AB (ref 0.00–0.08)

## 2015-03-30 LAB — BRAIN NATRIURETIC PEPTIDE: B NATRIURETIC PEPTIDE 5: 1747.2 pg/mL — AB (ref 0.0–100.0)

## 2015-03-30 MED ORDER — POTASSIUM CHLORIDE CRYS ER 10 MEQ PO TBCR
10.0000 meq | EXTENDED_RELEASE_TABLET | Freq: Every day | ORAL | Status: DC
  Administered 2015-03-31: 10 meq via ORAL
  Filled 2015-03-30 (×2): qty 1

## 2015-03-30 MED ORDER — SODIUM CHLORIDE 0.9 % IV SOLN
250.0000 mL | INTRAVENOUS | Status: DC | PRN
  Administered 2015-04-04: 250 mL via INTRAVENOUS

## 2015-03-30 MED ORDER — VANCOMYCIN HCL 500 MG IV SOLR: 500.0000 mg | Freq: Two times a day (BID) | INTRAVENOUS | Status: DC

## 2015-03-30 MED ORDER — SODIUM CHLORIDE 0.9 % IJ SOLN
3.0000 mL | INTRAMUSCULAR | Status: DC | PRN
Start: 2015-03-30 — End: 2015-04-07

## 2015-03-30 MED ORDER — FUROSEMIDE 10 MG/ML IJ SOLN
40.0000 mg | Freq: Two times a day (BID) | INTRAMUSCULAR | Status: DC
  Administered 2015-03-31 – 2015-04-02 (×5): 40 mg via INTRAVENOUS
  Filled 2015-03-30 (×4): qty 4

## 2015-03-30 MED ORDER — DEXTROSE 5 % IV SOLN
1.0000 g | Freq: Three times a day (TID) | INTRAVENOUS | Status: DC
  Administered 2015-03-31: 1 g via INTRAVENOUS
  Filled 2015-03-30 (×3): qty 1

## 2015-03-30 MED ORDER — ISOSORBIDE DINITRATE 10 MG PO TABS
10.0000 mg | ORAL_TABLET | Freq: Three times a day (TID) | ORAL | Status: DC
  Administered 2015-03-31 – 2015-04-03 (×10): 10 mg via ORAL
  Filled 2015-03-30 (×8): qty 1

## 2015-03-30 MED ORDER — DEXTROSE 5 % IV SOLN
1.0000 g | Freq: Once | INTRAVENOUS | Status: AC
  Administered 2015-03-30: 1 g via INTRAVENOUS
  Filled 2015-03-30: qty 1

## 2015-03-30 MED ORDER — ALBUTEROL SULFATE (2.5 MG/3ML) 0.083% IN NEBU
2.5000 mg | INHALATION_SOLUTION | Freq: Four times a day (QID) | RESPIRATORY_TRACT | Status: DC | PRN
  Administered 2015-04-03: 2.5 mg via RESPIRATORY_TRACT
  Filled 2015-03-30: qty 3

## 2015-03-30 MED ORDER — OXYCODONE-ACETAMINOPHEN 5-325 MG PO TABS
1.0000 | ORAL_TABLET | Freq: Four times a day (QID) | ORAL | Status: DC | PRN
  Administered 2015-03-31 – 2015-04-06 (×11): 1 via ORAL
  Filled 2015-03-30 (×12): qty 1

## 2015-03-30 MED ORDER — VANCOMYCIN HCL 10 G IV SOLR
1750.0000 mg | Freq: Once | INTRAVENOUS | Status: AC
  Administered 2015-03-30: 1750 mg via INTRAVENOUS
  Filled 2015-03-30: qty 1750

## 2015-03-30 MED ORDER — SODIUM CHLORIDE 0.9 % IJ SOLN
3.0000 mL | Freq: Two times a day (BID) | INTRAMUSCULAR | Status: DC
  Administered 2015-03-31 – 2015-04-07 (×8): 3 mL via INTRAVENOUS

## 2015-03-30 MED ORDER — ATORVASTATIN CALCIUM 20 MG PO TABS
20.0000 mg | ORAL_TABLET | Freq: Every day | ORAL | Status: DC
  Administered 2015-03-31 – 2015-04-06 (×7): 20 mg via ORAL
  Filled 2015-03-30 (×7): qty 1

## 2015-03-30 MED ORDER — ASPIRIN EC 81 MG PO TBEC
81.0000 mg | DELAYED_RELEASE_TABLET | Freq: Every day | ORAL | Status: DC
  Administered 2015-03-31 – 2015-04-07 (×8): 81 mg via ORAL
  Filled 2015-03-30 (×8): qty 1

## 2015-03-30 MED ORDER — LORAZEPAM 0.5 MG PO TABS
0.5000 mg | ORAL_TABLET | Freq: Two times a day (BID) | ORAL | Status: DC | PRN
  Administered 2015-04-01 – 2015-04-06 (×8): 0.5 mg via ORAL
  Filled 2015-03-30 (×10): qty 1

## 2015-03-30 MED ORDER — POLYETHYLENE GLYCOL 3350 17 G PO PACK
17.0000 g | PACK | Freq: Every day | ORAL | Status: DC | PRN
  Administered 2015-04-04: 17 g via ORAL
  Filled 2015-03-30: qty 1

## 2015-03-30 MED ORDER — DOCUSATE SODIUM 100 MG PO CAPS
100.0000 mg | ORAL_CAPSULE | Freq: Two times a day (BID) | ORAL | Status: DC | PRN
  Administered 2015-04-04: 100 mg via ORAL
  Filled 2015-03-30: qty 1

## 2015-03-30 MED ORDER — SODIUM CHLORIDE 0.9 % IV SOLN
500.0000 mg | Freq: Two times a day (BID) | INTRAVENOUS | Status: DC
  Administered 2015-03-31: 500 mg via INTRAVENOUS
  Filled 2015-03-30 (×2): qty 500

## 2015-03-30 MED ORDER — FUROSEMIDE 10 MG/ML IJ SOLN
40.0000 mg | INTRAMUSCULAR | Status: AC
  Administered 2015-03-30: 40 mg via INTRAVENOUS
  Filled 2015-03-30: qty 4

## 2015-03-30 NOTE — H&P (Signed)
PCP: Peter Swaziland, MD  Cardiology Peter [place] Vasc Surgery Tawanna Cooler Early   Referring provider  Denis   Chief Complaint:  Shortness of breath  HPI: Karina Willis is a 82 y.o. female   has a past medical history of CHF (congestive heart failure) (HCC); Mitral insufficiency; Coronary artery disease (2001); SOB (shortness of breath); Hypertension; Hyperlipidemia; History of TIAs; Arthritis; Non compliance w medication regimen; Neck pain; PAD (peripheral artery disease) (HCC); Critical lower limb ischemia; and Pulmonary HTN (HCC).   Presented from nursing home facility with diagnosis of pneumonia 3 days ago as well as lower extremity edema for the past 2 weeks she was treated with Levaquin but did not improve. She was treated with Lasix at the nursing facility but unclear if she was able to diuresis. Patient is on  Oxygen for the past 1 week but unsure how many litters.  Patient reports severe arm pain in the right forearm perivascular surgery stated they will concern for possible hematoma secondary to recent blood draw. She is on chronic anticoagulation Patient currently resides at nursing facility she requires total assist from bed to chair unable to transfer.  In emergency department: chest x-ray showing moderate-sized right pleural effusion interval right basal atelectasis or pneumonia as well as mild increased left basal atelectasis and pneumonia. Patient received cefepime and vancomycin She was afebrile what blood cell elevated 11.1 no evidence of tachycardia. Hemoglobin 9.0 which is her baseline. He was noted to have elevated BNP up to 1747 and elevated troponin up to 0.13  Patient is not endorse any chest pain.  Consultant case discussed with Dr. Carolanne Grumbling cardiology who feels that elevated troponin currently likely second to demand ischemia due to CHF and pneumonia. Recommends continuing to cycle and today will see patient in consult in the morning  Regarding past  history: Patient states she had a  In her legs 6 months ago and was started on xarelto denies any bleeding but 2 days ago she had a blood drwa from right ARM nd since then developed hematoma and swelling with some pain.     Patient has known history of coronary disease and ischemic cardiomyopathy history of chronic systolic heart failure as well as diastolic heart failure and moderate to severe MR. Last cardiac cath was in 2001 show on mild LAD 50% in or still a circumflex 80% then distal occlusion 9 Naprosyn similar RCA been occluded tear at that time was 45-40 percent with inferior akinesis. She has been treated medically for this as per cardiology notes. Repeat echogram June 2016 showed EF 55-60 percent Patient has history of severe peripheral arterial disease with critical limb ischemia in the past status post left femoral to above-the-knee bypass done by Dr. Arbie Cookey in July 2016. She was seen in the office on 17 of January.  Patient has history of severe pulmonary hypertension with RV pressures of 75 mmHg  Hospitalist was called for admission for acute on chronic diastolic and systolic CHF exacerbation as well as healthcare associated pneumonia and elevated troponin  Review of Systems:    Pertinent positives include: shortness of breath at rest, dyspnea on exertion, Bilateral lower extremity swelling   Constitutional:  No weight loss, night sweats, Fevers, chills, fatigue, weight loss  HEENT:  No headaches, Difficulty swallowing,Tooth/dental problems,Sore throat,  No sneezing, itching, ear ache, nasal congestion, post nasal drip,  Cardio-vascular:  No chest pain, Orthopnea, PND, anasarca, dizziness, palpitations.no  GI:  No heartburn, indigestion, abdominal pain, nausea, vomiting, diarrhea, change  in bowel habits, loss of appetite, melena, blood in stool, hematemesis Resp:  no . No No excess mucus, no productive cough, No non-productive cough, No coughing up of blood.No change in color  of mucus.No wheezing. Skin:  no rash or lesions. No jaundice GU:  no dysuria, change in color of urine, no urgency or frequency. No straining to urinate.  No flank pain.  Musculoskeletal:  No joint pain or no joint swelling. No decreased range of motion. No back pain.  Psych:  No change in mood or affect. No depression or anxiety. No memory loss.  Neuro: no localizing neurological complaints, no tingling, no weakness, no double vision, no gait abnormality, no slurred speech, no confusion  Otherwise ROS are negative except for above, 10 systems were reviewed  Past Medical History: Past Medical History  Diagnosis Date  . CHF (congestive heart failure) (HCC)   . Mitral insufficiency   . Coronary artery disease 2001    WITH DOCUMENTED TOTAL OCCLUSION OF THE RIGHT CORONARY AND THE LEFT CIRCUMFLEX CORONARY  . SOB (shortness of breath)   . Hypertension   . Hyperlipidemia   . History of TIAs   . Arthritis   . Non compliance w medication regimen   . Neck pain   . PAD (peripheral artery disease) (HCC)   . Critical lower limb ischemia   . Pulmonary HTN (HCC)    Past Surgical History  Procedure Laterality Date  . Cardiac catheterization  06/05/1999    ENLARGED LEFT VENTRICULAR SIZE. THERE IS AKINESIA OF THE INFERIOR BASE. LEFT VENTRICULAR  FUNCTIONS APPEAR TO BE MODERATELY REDUCED WITH EF 35-40%  . Appendectomy    . Left arm surgery    . Peripheral vascular catheterization Left 09/21/2014    Procedure: Lower Extremity Angiography;  Surgeon: Larina Earthly, MD;  Location: [facility] INVASIVE CV LAB;  Service: Cardiovascular;  Laterality: Left;  . Femoral-popliteal bypass graft Left 09/26/2014    Procedure: LEFT FEMORAL-ABOVE THE KNEE POPLITEAL ARTERY BYPASS GRAFT USING NON REVERSE LEFT GREATER SAPHENOUS VEIN. ;  Surgeon: Larina Earthly, MD;  Location[facility];  Service: Vascular;  Laterality: Left;     Medications: Prior to Admission medications   Medication Sig Start Date End Date Taking?  Authorizing Provider  acetaminophen (TYLENOL) 325 MG tablet Take 2 tablets (650 mg total) by mouth every 6 (six) hours as needed for moderate pain. 08/04/13   Nani Ravens, MD  albuterol (PROVENTIL) (2.5 MG/3ML) 0.083% nebulizer solution Take 2.5 mg by nebulization every 6 (six) hours as needed for wheezing or shortness of breath.    Historical Provider, MD  aspirin EC 81 MG EC tablet Take 1 tablet (81 mg total) by mouth daily. 09/28/14   Gust Rung, DO  aspirin-acetaminophen-caffeine (EXCEDRIN MIGRAINE) [phone] MG per tablet Take 1 tablet by mouth every 6 (six) hours as needed for headache.    Historical Provider, MD  atorvastatin (LIPITOR) 20 MG tablet Take 20 mg by mouth daily.    Historical Provider, MD  docusate sodium (COLACE) 100 MG capsule Take 1 capsule (100 mg total) by mouth 2 (two) times daily as needed for mild constipation. 09/28/14   Gust Rung, DO  folic acid (FOLVITE) 1 MG tablet Take 1 tablet (1 mg total) by mouth daily. 09/28/14   Gust Rung, DO  furosemide (LASIX) 20 MG tablet Take 20 mg by mouth daily.    Historical Provider, MD  hydrocerin (EUCERIN) CREA Apply 1 application topically 2 (two) times daily. 08/16/14  Hyacinth Meeker, MD  isosorbide dinitrate (ISORDIL) 10 MG tablet Take 1 tablet (10 mg total) by mouth 3 (three) times daily. 02/23/15   Marquette Saa, MD  LORazepam (ATIVAN) 0.5 MG tablet Take 0.5 mg by mouth every 12 (twelve) hours as needed for anxiety.    Historical Provider, MD  LORazepam (ATIVAN) 2 MG/ML concentrated solution Place 0.5 mg into feeding tube once. Reported on 03/28/2015    Historical Provider, MD  nitroGLYCERIN (NITROSTAT) 0.4 MG SL tablet Place 1 tablet (0.4 mg total) under the tongue every 5 (five) minutes as needed for chest pain. Must keep appointment 10/31/14 with Dr Swaziland 09/16/14   Peter M Swaziland, MD  oxycodone (OXY-IR) 5 MG capsule Take 5 mg by mouth every 4 (four) hours as needed for pain.    Historical Provider, MD   oxyCODONE-acetaminophen (PERCOCET/ROXICET) 5-325 MG per tablet Take 1 tablet by mouth every 6 (six) hours as needed for moderate pain. 09/28/14   Gust Rung, DO  phenol (CHLORASEPTIC) 1.4 % LIQD Use as directed 1 spray in the mouth or throat as needed for throat irritation / pain. 09/28/14   Gust Rung, DO  polyethylene glycol (MIRALAX / GLYCOLAX) packet Take 17 g by mouth daily as needed. 09/28/14   Gust Rung, DO  Potassium Chloride (KLOR-CON 10 PO) Take 10 mg by mouth daily.    Historical Provider, MD  vitamin B-12 1000 MCG tablet Take 1 tablet (1,000 mcg total) by mouth daily. 08/16/14   Hyacinth Meeker, MD    Allergies:   Allergies  Allergen Reactions  . Penicillins Hives and Itching    Pt reports not being allergic to penicillin Tolerates Ancef    Social History:    bed bound   From facility [facility]   reports that she quit smoking about 31 years ago. Her smoking use included Cigarettes. She has never used smokeless tobacco. She reports that she does not drink alcohol or use illicit drugs.     Family History: family history includes Heart attack in her brother, brother, brother, father, and mother; Heart disease in her sister and sister.    Physical Exam: Patient Vitals for the past 24 hrs:  BP Temp Temp src Pulse Resp SpO2  03/30/15 1800 134/73 mmHg - - - (!) 0 -  03/30/15 1745 - - - - (!) 36 -  03/30/15 1730 124/71 mmHg - - - 26 -  03/30/15 1517 113/57 mmHg 98.4 F (36.9 C) Oral 80 22 92 %    1. General:  in No Acute distress 2. Psychological: Alert and   Oriented 3. Head/ENT:   Moist Mucous Membranes                          Head Non traumatic, neck supple                            Poor Dentition 4. SKIN: normal  Skin turgor,  Skin clean Dry and intact no rash 5. Heart: Regular rate and rhythm no Murmur, Rub or gallop 6. Lungs:  Mild occasional wheezes some crackles   7. Abdomen: Soft, non-tender, Non distended 8. Lower extremities: no  clubbing, cyanosis, 2+ edema 9. Neurologically Grossly intact, moving all 4 extremities equally 10. MSK: Normal range of motion  body mass index is unknown because there is no weight on file.   Labs on Admission:   Results  for orders placed or performed during the hospital encounter of 03/30/15 (from the past 24 hour(s))  I-stat troponin, ED     Status: Abnormal   Collection Time: 03/30/15  4:14 PM  Result Value Ref Range   Troponin i, poc 0.13 (HH) 0.00 - 0.08 ng/mL   Comment NOTIFIED PHYSICIAN    Comment 3          Brain natriuretic peptide     Status: Abnormal   Collection Time: 03/30/15  4:15 PM  Result Value Ref Range   B Natriuretic Peptide 1747.2 (H) 0.0 - 100.0 pg/mL  Basic metabolic panel     Status: Abnormal   Collection Time: 03/30/15  4:16 PM  Result Value Ref Range   Sodium 145 135 - 145 mmol/L   Potassium 4.2 3.5 - 5.1 mmol/L   Chloride 108 101 - 111 mmol/L   CO2 30 22 - 32 mmol/L   Glucose, Bld 140 (H) 65 - 99 mg/dL   BUN 21 (H) 6 - 20 mg/dL   Creatinine, Ser 1.61 0.44 - 1.00 mg/dL   Calcium 8.9 8.9 - 09.6 mg/dL   GFR calc non Af Amer 52 (L) >60 mL/min   GFR calc Af Amer >60 >60 mL/min   Anion gap 7 5 - 15  CBC with Differential     Status: Abnormal   Collection Time: 03/30/15  4:16 PM  Result Value Ref Range   WBC 11.1 (H) 4.0 - 10.5 K/uL   RBC 3.57 (L) 3.87 - 5.11 MIL/uL   Hemoglobin 9.0 (L) 12.0 - 15.0 g/dL   HCT 04.5 (L) 40.9 - 81.1 %   MCV 87.7 78.0 - 100.0 fL   MCH 25.2 (L) 26.0 - 34.0 pg   MCHC 28.8 (L) 30.0 - 36.0 g/dL   RDW 91.4 (H) 78.2 - 95.6 %   Platelets 216 150 - 400 K/uL   Neutrophils Relative % 77 %   Neutro Abs 8.6 (H) 1.7 - 7.7 K/uL   Lymphocytes Relative 9 %   Lymphs Abs 1.0 0.7 - 4.0 K/uL   Monocytes Relative 13 %   Monocytes Absolute 1.5 (H) 0.1 - 1.0 K/uL   Eosinophils Relative 1 %   Eosinophils Absolute 0.1 0.0 - 0.7 K/uL   Basophils Relative 0 %   Basophils Absolute 0.0 0.0 - 0.1 K/uL    UA not obtained  Lab Results    Component Value Date   HGBA1C 5.9* 08/14/2014    CrCl cannot be calculated (Unknown ideal weight.).  BNP (last 3 results) No results for input(s): PROBNP in the last 8760 hours.  Other results:  I have pearsonaly reviewed this: ECG REPORT  Rate: 100  Rhythm: Normal sinus rhythm ST&T Change: No ischemic change QTC within normal limits  There were no vitals filed for this visit.   Cultures:    Component Value Date/Time   SDES BLOOD RIGHT HAND 09/23/2014 1415   SPECREQUEST BOTTLES DRAWN AEROBIC AND ANAEROBIC 5CC 09/23/2014 1415   CULT NO GROWTH 5 DAYS 09/23/2014 1415   REPTSTATUS 09/28/2014 FINAL 09/23/2014 1415     Radiological Exams on Admission: Dg Chest 2 View  03/30/2015  CLINICAL DATA:  Shortness of breath. Diffuse chest pain. Cough. Recently diagnosed pneumonia. EXAM: CHEST  2 VIEW COMPARISON:  02/22/2015. FINDINGS: Stable enlarged cardiac silhouette. Interval moderate-sized left pleural effusion. A moderate-sized left pleural effusion is again demonstrated. Interval increased density in the right lower lung zone and mildly increased density in the left lower lung zone.  Thoracic spine and bilateral shoulder degenerative changes. IMPRESSION: 1. Interval moderate-sized right pleural effusion without significant change in a moderate-sized left pleural effusion. 2. Interval right basilar atelectasis or pneumonia. 3. Mildly increased left basilar atelectasis or pneumonia. 4. Stable cardiomegaly. Electronically Signed   By: Beckie Salts M.D.   On: 03/30/2015 16:39    Chart has been reviewed  Family Friend at  Bedside    Assessment/Plan  82 year old female with known history of coronary disease would by Peter Sw[place]and as well as chronic systolic and diastolic heart failure before last known EF being 55-60 percent presents for worsening edema and shortness of breath was found to have evidence of acute on chronic heart failure and pneumonia associated with pleural effusions as  well as elevated troponin likely secondary to demand ischemia.   Present on Admission:  . Acute diastolic heart failure (HCC)  - - admit on telemetry, cycle cardiac enzymes, obtain serial ECG, to evaluate for ischemia as a cause of heart failure  monitor daily weight  diurese with IV lasix and monitor creatinine to avoid over diuresis.  Order echogram to evaluate EF and valves Did not start on ACE/ARBi history of chronic kidney disease and improved EF  cardiology consult has been requested  . Anemia - will check anemia panel in the morning Hemoccult stool, chronic unchanged from base  . Chronic kidney disease (CKD), stage III (moderate) - improved creatinine in a setting of CHF exacerbation. Expect creatinine to go back up to baseline after patient's been diuresis will need to make sure no overdiuresis happens  . Coronary artery disease - we will cycle cardiac enzymes continue aspirin and statin cardiology is aware  . Elevated troponin - continue to cycle cardiac enzymes likely secondary to demand ischemia patient does not report any chest pain she has been having some shortness of breath in the setting of pleural effusion or evidence of CHF as well as Healthcare associated pneumonia  . HYPERTENSION, BENIGN SYSTEMIC continue home medications  . Arm swelling - likely secondary to small hematoma in the setting of IV draw. We will monitor need to keep arm elevated  . Acute on chronic combined systolic (congestive) and diastolic (congestive) heart failure (HCC) . HCAP (healthcare-associated pneumonia) -  - will admit for treatment of HCAP will start on appropriate antibiotic coverage.   Obtain sputum cultures, blood cultures if febrile or if decompensates.  Provide oxygen as needed.  Marland Kitchen DVT (deep venous thrombosis) (HCC) - for now continue Xarelto - the evaluate for any persistent clot burden given persistent lower extremity edema. This will also help to determine if patient needs to stay on lifelong  Xarelto unfortunately she is at high risk of repeat DVT given being bedbound   Prophylaxis: xarelto  CODE STATUS:  FULL CODE  as per patient    Disposition:                           Back to current facility when stable            Other plan as per orders.  I have spent a total of on this admission  Extra time spent discussing case with the family, discussing with cardiology and reviewing prior records  Kaeson Kleinert 03/30/2015, 7:58 PM    [facility]  Pager [phone]   after 2 AM please page floor coverage PA If 7AM-7PM, please contact the day team taking care of the patient  [email or web address]  Password [email or web address]

## 2015-03-30 NOTE — ED Notes (Signed)
Admitting Dr. Bedside  

## 2015-03-30 NOTE — ED Notes (Signed)
PA-C Dansie bedside.

## 2015-03-30 NOTE — Progress Notes (Signed)
ANTIBIOTIC CONSULT NOTE - INITIAL  Pharmacy Consult for Vancomycin Indication: pneumonia  Allergies  Allergen Reactions  . Penicillins Hives and Itching    Pt reports not being allergic to penicillin Tolerates Ancef    Patient Measurements:  Total Body Weight: 82.9kg Ideal Body Weight: 45.5kg  Adjusted Body Weight: 60.46kg  Vital Signs: Temp: 98.4 F (36.9 C) (01/19 1517) Temp Source: Oral (01/19 1517) BP: 113/57 mmHg (01/19 1517) Pulse Rate: 80 (01/19 1517) Intake/Output from previous day:   Intake/Output from this shift:    Labs:  Recent Labs  03/30/15 1616  WBC 11.1*  HGB 9.0*  PLT 216  CREATININE 0.99   CrCl cannot be calculated (Unknown ideal weight.). No results for input(s): VANCOTROUGH, VANCOPEAK, VANCORANDOM, GENTTROUGH, GENTPEAK, GENTRANDOM, TOBRATROUGH, TOBRAPEAK, TOBRARND, AMIKACINPEAK, AMIKACINTROU, AMIKACIN in the last 72 hours.   Microbiology: No results found for this or any previous visit (from the past 720 hour(s)).  Medical History: Past Medical History  Diagnosis Date  . CHF (congestive heart failure) (HCC)   . Mitral insufficiency   . Coronary artery disease 2001    WITH DOCUMENTED TOTAL OCCLUSION OF THE RIGHT CORONARY AND THE LEFT CIRCUMFLEX CORONARY  . SOB (shortness of breath)   . Hypertension   . Hyperlipidemia   . History of TIAs   . Arthritis   . Non compliance w medication regimen   . Neck pain   . PAD (peripheral artery disease) (HCC)   . Critical lower limb ischemia   . Pulmonary HTN (HCC)     Medications:   (Not in a hospital admission) Scheduled:  . furosemide  40 mg Intravenous STAT   Infusions:  . ceFEPime (MAXIPIME) IV    . vancomycin     Assessment: 82yo F w/ hx of L femoral to above-knee popliteal bypass w/ saphenous vein from July 2016, marked lower extremity edema w/ severe chronic congestive HF, pain in right forearm, extensive bruising and hematoma associated w/ recent blood draw. Presented for follow  up of L femoral AK bypass and was dx w/ PNA. Pharmacy consulted to dose Vancomycin for PNA. Afebrile, WBC 11.1, sCr 0.99, NCrCl ~51, no cx results  Pt received one dose of Cefepime IV 1g in ED  1/19>>Cefepime(x1) 1/19>>Vancomycin>>  Goal of Therapy:  Vancomycin trough level 15-20 mcg/ml  Eradication of infection  Plan:  Vancomycin  IV once, then  IV q12h Measure antibiotic drug levels at steady state Follow up culture results, clinical course, abx tx (cefepime), renal fxn  Charolette Child, PharmD Student   I agree with the assessment and plan.   Agapito Games, PharmD, BCPS Clinical Pharmacist Pager: [phone] 03/30/2015 6:43 PM

## 2015-03-30 NOTE — ED Notes (Signed)
Attempted to call report x 1  

## 2015-03-30 NOTE — ED Notes (Signed)
Phlebotomy at the bedside, pt complains that it is too painful to do blood cultures. Explained to pt importance of drawing blood cultures at this time. Pt refused, stated, "try again later".

## 2015-03-30 NOTE — ED Notes (Signed)
Per EMS - pt dx pneumonia w/ excess fluid on extremities x 2 weeks. Pt started abx after dx for pneumonia, given lasix for fluid on lower extremities. LPN and MD at [facility] believes lasix is helping; however, family and CNA do not think lasix is helping. Pt requesting pt be seen at ED for reevaluation. VSS.

## 2015-03-30 NOTE — ED Provider Notes (Signed)
CSN: [id number]     Arrival date & time 03/30/15  1510 History   First MD Initiated Contact with Patient 03/30/15 1514     Chief Complaint  Patient presents with  . Edema   Karina Willis is a 82 y.o. female with a history of CHF, PAD, CAD, and pulmonary HTN who presents to the ED from SNF complaining of increasing SOB and lower extremity edema over the past several days. The patient was recently seen by her PCP and diagnosed with pneumonia and started on Levaquin. She has chronic lower extremity edema and reports this has worsened after her visit to vascular surgery two days ago. She reports she was sitting upright for a long period and this made her lower extremity edema worse. She is currently taking lasix. She also reports right UE edema for the past several days. She is currently on Xarelto for previous DVTs to her LE. Patient reports coughing and increasing SOB. She reports pain with movement of her legs. She denies chest discomfort. She denies fevers, productive cough, abdominal pain, nausea, vomiting, diarrhea.   (Consider location/radiation/quality/duration/timing/severity/associated sxs/prior Treatment) HPI  Past Medical History  Diagnosis Date  . CHF (congestive heart failure) (HCC)   . Mitral insufficiency   . Coronary artery disease 2001    WITH DOCUMENTED TOTAL OCCLUSION OF THE RIGHT CORONARY AND THE LEFT CIRCUMFLEX CORONARY  . SOB (shortness of breath)   . Hypertension   . Hyperlipidemia   . History of TIAs   . Arthritis   . Non compliance w medication regimen   . Neck pain   . PAD (peripheral artery disease) (HCC)   . Critical lower limb ischemia   . Pulmonary HTN (HCC)    Past Surgical History  Procedure Laterality Date  . Cardiac catheterization  06/05/1999    ENLARGED LEFT VENTRICULAR SIZE. THERE IS AKINESIA OF THE INFERIOR BASE. LEFT VENTRICULAR  FUNCTIONS APPEAR TO BE MODERATELY REDUCED WITH EF 35-40%  . Appendectomy    . Left arm surgery    . Peripheral  vascular catheterization Left 09/21/2014    Procedure: Lower Extremity Angiography;  Surgeon: Larina Earthly, MD;  Location: [facility] INVASIVE CV LAB;  Service: Cardiovascular;  Laterality: Left;  . Femoral-popliteal bypass graft Left 09/26/2014    Procedure: LEFT FEMORAL-ABOVE THE KNEE POPLITEAL ARTERY BYPASS GRAFT USING NON REVERSE LEFT GREATER SAPHENOUS VEIN. ;  Surgeon: Larina Earthly, MD;  Location: [facility] OR;  Service: Vascular;  Laterality: Left;   Family History  Problem Relation Age of Onset  . Heart attack Mother   . Heart attack Father   . Heart disease Sister   . Heart attack Brother   . Heart attack Brother   . Heart attack Brother   . Heart disease Sister    Social History  Substance Use Topics  . Smoking status: Former Smoker    Types: Cigarettes    Quit date: 08/21/1983  . Smokeless tobacco: Never Used  . Alcohol Use: No   OB History    No data available     Review of Systems  Constitutional: Negative for fever and chills.  HENT: Negative for congestion and sore throat.   Eyes: Negative for visual disturbance.  Respiratory: Positive for cough and shortness of breath. Negative for chest tightness and wheezing.   Cardiovascular: Positive for leg swelling. Negative for chest pain and palpitations.  Gastrointestinal: Negative for nausea, vomiting, abdominal pain and diarrhea.  Genitourinary: Negative for dysuria.  Musculoskeletal: Negative for back pain and  neck pain.  Skin: Negative for rash and wound.  Neurological: Negative for headaches.      Allergies  Penicillins  Home Medications   Prior to Admission medications   Medication Sig Start Date End Date Taking? Authorizing Provider  acetaminophen (TYLENOL) 325 MG tablet Take 2 tablets (650 mg total) by mouth every 6 (six) hours as needed for moderate pain. 08/04/13  Yes Nani Ravens, MD  albuterol (PROVENTIL) (2.5 MG/3ML) 0.083% nebulizer solution Take 2.5 mg by nebulization every 6 (six) hours as needed for wheezing  or shortness of breath.   Yes Historical Provider, MD  aspirin EC 81 MG EC tablet Take 1 tablet (81 mg total) by mouth daily. 09/28/14  Yes Gust Rung, DO  aspirin-acetaminophen-caffeine (EXCEDRIN MIGRAINE) [id number] MG per tablet Take 1 tablet by mouth every 6 (six) hours as needed for headache.   Yes Historical Provider, MD  atorvastatin (LIPITOR) 20 MG tablet Take 20 mg by mouth daily.   Yes Historical Provider, MD  diphenhydrAMINE (BENADRYL) 25 MG tablet Take 25 mg by mouth every 6 (six) hours as needed for itching.   Yes Historical Provider, MD  docusate sodium (COLACE) 100 MG capsule Take 1 capsule (100 mg total) by mouth 2 (two) times daily as needed for mild constipation. 09/28/14  Yes Gust Rung, DO  folic acid (FOLVITE) 1 MG tablet Take 1 tablet (1 mg total) by mouth daily. 09/28/14  Yes Gust Rung, DO  furosemide (LASIX) 20 MG tablet Take 40 mg by mouth daily.    Yes Historical Provider, MD  guaiFENesin (MUCINEX) 600 MG 12 hr tablet Take 600 mg by mouth 2 (two) times daily. x7 days   Yes Historical Provider, MD  isosorbide dinitrate (ISORDIL) 10 MG tablet Take 1 tablet (10 mg total) by mouth 3 (three) times daily. 02/23/15  Yes Marquette Saa, MD  LORazepam (ATIVAN) 0.5 MG tablet Take 0.5 mg by mouth every 12 (twelve) hours as needed for anxiety.   Yes Historical Provider, MD  nitroGLYCERIN (NITROSTAT) 0.4 MG SL tablet Place 1 tablet (0.4 mg total) under the tongue every 5 (five) minutes as needed for chest pain. Must keep appointment 10/31/14 with Dr Swaziland 09/16/14  Yes Peter M Sw[place]iland, MD  oxycodone (OXY-IR) 5 MG capsule Take 5 mg by mouth every 4 (four) hours as needed for pain.   Yes Historical Provider, MD  oxyCODONE-acetaminophen (PERCOCET/ROXICET) 5-325 MG per tablet Take 1 tablet by mouth every 6 (six) hours as needed for moderate pain. 09/28/14  Yes Gust Rung, DO  phenol (CHLORASEPTIC) 1.4 % LIQD Use as directed 1 spray in the mouth or throat as needed for  throat irritation / pain. 09/28/14  Yes Gust Rung, DO  polyethylene glycol (MIRALAX / GLYCOLAX) packet Take 17 g by mouth daily as needed. 09/28/14  Yes Gust Rung, DO  Potassium Chloride (KLOR-CON 10 PO) Take 10 mg by mouth daily.   Yes Historical Provider, MD  Rivaroxaban (XARELTO) 15 MG TABS tablet Take 15 mg by mouth daily with supper.   Yes Historical Provider, MD  sacubitril-valsartan (ENTRESTO) 24-26 MG Take 1 tablet by mouth 2 (two) times daily.   Yes Historical Provider, MD  vitamin B-12 1000 MCG tablet Take 1 tablet (1,000 mcg total) by mouth daily. 08/16/14  Yes Tasrif Ahmed, MD  hydrocerin (EUCERIN) CREA Apply 1 application topically 2 (two) times daily. 08/16/14   Tasrif Ahmed, MD   BP 106/68 mmHg  Pulse 108  Temp(Src) 98.4  F (36.9 C) (Oral)  Resp 16  SpO2 96% Physical Exam  Constitutional: She is oriented to person, place, and time. She appears well-developed and well-nourished. No distress.  Nontoxic appearing.  HENT:  Head: Normocephalic and atraumatic.  Mouth/Throat: Oropharynx is clear and moist.  Eyes: Conjunctivae are normal. Pupils are equal, round, and reactive to light. Right eye exhibits no discharge. Left eye exhibits no discharge.  Neck: Normal range of motion. Neck supple. No JVD present.  Cardiovascular: Normal rate, regular rhythm, normal heart sounds and intact distal pulses.  Exam reveals no gallop and no friction rub.   No murmur heard. Bilateral radial pulses are intact. I cannot palpate bilateral pedal pulses. Good capillary refill. toes are warm.   Pulmonary/Chest: Effort normal and breath sounds normal. No respiratory distress. She has no wheezes. She has no rales.  tachypneia with RR of 30. No rales or rhonchi.   Abdominal: Soft. Bowel sounds are normal. There is no tenderness. There is no guarding.  Musculoskeletal: She exhibits edema and tenderness.  Bilateral pitting LE edema. Right UE edema.   Lymphadenopathy:    She has no cervical  adenopathy.  Neurological: She is alert and oriented to person, place, and time. Coordination normal.  Skin: Skin is warm and dry. No rash noted. She is not diaphoretic. No erythema. No pallor.  Psychiatric: She has a normal mood and affect. Her behavior is normal.  Nursing note and vitals reviewed.   ED Course  Procedures (including critical care time) Labs Review Labs Reviewed  BRAIN NATRIURETIC PEPTIDE - Abnormal; Notable for the following:    B Natriuretic Peptide 1747.2 (*)    All other components within normal limits  BASIC METABOLIC PANEL - Abnormal; Notable for the following:    Glucose, Bld 140 (*)    BUN 21 (*)    GFR calc non Af Amer 52 (*)    All other components within normal limits  CBC WITH DIFFERENTIAL/PLATELET - Abnormal; Notable for the following:    WBC 11.1 (*)    RBC 3.57 (*)    Hemoglobin 9.0 (*)    HCT 31.3 (*)    MCH 25.2 (*)    MCHC 28.8 (*)    RDW 19.1 (*)    Neutro Abs 8.6 (*)    Monocytes Absolute 1.5 (*)    All other components within normal limits  I-STAT TROPOININ, ED - Abnormal; Notable for the following:    Troponin i, poc 0.13 (*)    All other components within normal limits  CULTURE, BLOOD (ROUTINE X 2)  CULTURE, BLOOD (ROUTINE X 2)  CULTURE, EXPECTORATED SPUTUM-ASSESSMENT  GRAM STAIN  VITAMIN B12  FOLATE  IRON AND TIBC  FERRITIN  RETICULOCYTES  STREP PNEUMONIAE URINARY ANTIGEN  COMPREHENSIVE METABOLIC PANEL  CBC WITH DIFFERENTIAL/PLATELET  BRAIN NATRIURETIC PEPTIDE  TROPONIN I  TROPONIN I  TROPONIN I  LEGIONELLA ANTIGEN, URINE  LEGIONELLA PNEUMOPHILA SEROGP 1 UR AG  I-STAT CG4 LACTIC ACID, ED  I-STAT CG4 LACTIC ACID, ED    Imaging Review Dg Chest 2 View  03/30/2015  CLINICAL DATA:  Shortness of breath. Diffuse chest pain. Cough. Recently diagnosed pneumonia. EXAM: CHEST  2 VIEW COMPARISON:  02/22/2015. FINDINGS: Stable enlarged cardiac silhouette. Interval moderate-sized left pleural effusion. A moderate-sized left pleural  effusion is again demonstrated. Interval increased density in the right lower lung zone and mildly increased density in the left lower lung zone. Thoracic spine and bilateral shoulder degenerative changes. IMPRESSION: 1. Interval moderate-sized right pleural effusion without  significant change in a moderate-sized left pleural effusion. 2. Interval right basilar atelectasis or pneumonia. 3. Mildly increased left basilar atelectasis or pneumonia. 4. Stable cardiomegaly. Electronically Signed   By: Beckie Salts M.D.   On: 03/30/2015 16:39   I have personally reviewed and evaluated these images and lab results as part of my medical decision-making.   EKG Interpretation   Date/Time:  Thursday March 30 2015 16:05:43 EST Ventricular Rate:  100 PR Interval:  154 QRS Duration: 70 QT Interval:  308 QTC Calculation: 397 R Axis:   55 Text Interpretation:  Normal sinus rhythm Possible Left atrial enlargement  Low voltage QRS Cannot rule out Anterior infarct , age undetermined  Abnormal ECG When compared with ECG of 02/22/2015, Premature ventricular  complexes are no longer Present Confirmed by [facility]  MD, DAVID ([id number]) on  03/30/2015 8:26:48 PM      Filed Vitals:   03/30/15 2000 03/30/15 2130 03/30/15 2200 03/30/15 2230  BP: 140/76 116/70 121/58 106/68  Pulse: 111 111 108 108  Temp:      TempSrc:      Resp:  15 16   SpO2: 93% 95% 95% 96%     MDM   Meds given in ED:  Medications  vancomycin (VANCOCIN) 500 mg in sodium chloride 0.9 % 100 mL IVPB (not administered)  albuterol (PROVENTIL) (2.5 MG/3ML) 0.083% nebulizer solution 2.5 mg (not administered)  isosorbide dinitrate (ISORDIL) tablet 10 mg (not administered)  LORazepam (ATIVAN) tablet 0.5 mg (not administered)  atorvastatin (LIPITOR) tablet 20 mg (not administered)  potassium chloride (K-DUR) CR tablet 10 mEq (not administered)  aspirin EC tablet 81 mg (not administered)  docusate sodium (COLACE) capsule 100 mg (not administered)   oxyCODONE-acetaminophen (PERCOCET/ROXICET) 5-325 MG per tablet 1 tablet (not administered)  polyethylene glycol (MIRALAX / GLYCOLAX) packet 17 g (not administered)  furosemide (LASIX) injection 40 mg (not administered)  sodium chloride 0.9 % injection 3 mL (not administered)  sodium chloride 0.9 % injection 3 mL (not administered)  0.9 %  sodium chloride infusion (not administered)  ceFEPIme (MAXIPIME) 1 g in dextrose 5 % 50 mL IVPB (not administered)  furosemide (LASIX) injection 40 mg (40 mg Intravenous Given 03/30/15 2001)  ceFEPIme (MAXIPIME) 1 g in dextrose 5 % 50 mL IVPB (0 g Intravenous Stopped 03/30/15 1847)  vancomycin (VANCOCIN) 1,750 mg in sodium chloride 0.9 % 500 mL IVPB (0 mg Intravenous Stopped 03/30/15 2323)    Current Discharge Medication List      Final diagnoses:  HCAP (healthcare-associated pneumonia)  Acute on chronic congestive heart failure, unspecified congestive heart failure type [facility])   This is a 82 y.o. female with a history of CHF, PAD, CAD, and pulmonary HTN who presents to the ED from SNF complaining of increasing SOB and lower extremity edema over the past several days. The patient was recently seen by her PCP and diagnosed with pneumonia and started on Levaquin. She has chronic lower extremity edema and reports this has worsened after her visit to vascular surgery two days ago. She reports she was sitting upright for a long period and this made her lower extremity edema worse. She is currently taking lasix. She also reports right UE edema for the past several days. She is currently on Xarelto for previous DVTs to her LE. On exam the patient is afebrile and nontoxic appearing. She is slightly tachypneic. No hypoxia. No rales or rhonchi or wheezes noted on auscultation. Patient has pitting bilateral lower extremity edema. She also has right  UE edema, which she reports is new. She is currently on Xarelto. Patient is given lasix 40 mg.  CXR indicates worsening  pneumonia. BNP is elevated at 1747. Troponin is elevated at 0.13. No STEMI on EKG. Suspect demand ischemia. Hgb is stable. White count is 11,000. Antibiotics started for healthcare associated pneumonia with failure with outpatient therapy.  Patient started on vancomycin and cefepime. Will consult for admission.  Family is in agreement with admission.  Dr. Beatrice Lecher accepted the patient for admission.   This patient was discussed with and evaluated by Dr. Preston Fleeting who agrees with assessment and plan.   Everlene Farrier, PA-C 03/30/15 2356  Dione Booze, MD 03/31/15 1610  Dione Booze, MD 03/31/15 [phone]

## 2015-03-31 ENCOUNTER — Inpatient Hospital Stay (HOSPITAL_COMMUNITY): Payer: Medicare Other

## 2015-03-31 DIAGNOSIS — R7989 Other specified abnormal findings of blood chemistry: Secondary | ICD-10-CM

## 2015-03-31 DIAGNOSIS — I509 Heart failure, unspecified: Secondary | ICD-10-CM

## 2015-03-31 DIAGNOSIS — I10 Essential (primary) hypertension: Secondary | ICD-10-CM

## 2015-03-31 DIAGNOSIS — Z7901 Long term (current) use of anticoagulants: Secondary | ICD-10-CM

## 2015-03-31 DIAGNOSIS — I2510 Atherosclerotic heart disease of native coronary artery without angina pectoris: Secondary | ICD-10-CM

## 2015-03-31 DIAGNOSIS — M7989 Other specified soft tissue disorders: Secondary | ICD-10-CM

## 2015-03-31 DIAGNOSIS — I5031 Acute diastolic (congestive) heart failure: Secondary | ICD-10-CM

## 2015-03-31 LAB — COMPREHENSIVE METABOLIC PANEL
ALBUMIN: 2.3 g/dL — AB (ref 3.5–5.0)
ALK PHOS: 52 U/L (ref 38–126)
ALT: 17 U/L (ref 14–54)
ANION GAP: 5 (ref 5–15)
AST: 17 U/L (ref 15–41)
BILIRUBIN TOTAL: 0.9 mg/dL (ref 0.3–1.2)
BUN: 21 mg/dL — ABNORMAL HIGH (ref 6–20)
CALCIUM: 8.7 mg/dL — AB (ref 8.9–10.3)
CO2: 32 mmol/L (ref 22–32)
Chloride: 106 mmol/L (ref 101–111)
Creatinine, Ser: 0.89 mg/dL (ref 0.44–1.00)
GFR, EST NON AFRICAN AMERICAN: 60 mL/min — AB (ref >60)
GLUCOSE: 114 mg/dL — AB (ref 65–99)
POTASSIUM: 3.6 mmol/L (ref 3.5–5.1)
Sodium: 143 mmol/L (ref 135–145)
TOTAL PROTEIN: 6 g/dL — AB (ref 6.5–8.1)

## 2015-03-31 LAB — URINALYSIS, ROUTINE W REFLEX MICROSCOPIC
Bilirubin Urine: NEGATIVE
Glucose, UA: NEGATIVE mg/dL
Ketones, ur: NEGATIVE mg/dL
LEUKOCYTES UA: NEGATIVE
NITRITE: NEGATIVE
PROTEIN: NEGATIVE mg/dL
Specific Gravity, Urine: 1.009 (ref 1.005–1.030)
pH: 5.5 (ref 5.0–8.0)

## 2015-03-31 LAB — CBC WITH DIFFERENTIAL/PLATELET
BASOS ABS: 0 10*3/uL (ref 0.0–0.1)
BASOS PCT: 0 %
Eosinophils Absolute: 0.1 10*3/uL (ref 0.0–0.7)
Eosinophils Relative: 1 %
HEMATOCRIT: 28.1 % — AB (ref 36.0–46.0)
HEMOGLOBIN: 8.4 g/dL — AB (ref 12.0–15.0)
LYMPHS PCT: 13 %
Lymphs Abs: 1.4 10*3/uL (ref 0.7–4.0)
MCH: 25.9 pg — ABNORMAL LOW (ref 26.0–34.0)
MCHC: 29.9 g/dL — ABNORMAL LOW (ref 30.0–36.0)
MCV: 86.7 fL (ref 78.0–100.0)
Monocytes Absolute: 1.3 10*3/uL — ABNORMAL HIGH (ref 0.1–1.0)
Monocytes Relative: 12 %
NEUTROS ABS: 8 10*3/uL — AB (ref 1.7–7.7)
NEUTROS PCT: 74 %
Platelets: 201 10*3/uL (ref 150–400)
RBC: 3.24 MIL/uL — AB (ref 3.87–5.11)
RDW: 19 % — AB (ref 11.5–15.5)
WBC: 10.8 10*3/uL — AB (ref 4.0–10.5)

## 2015-03-31 LAB — URINE MICROSCOPIC-ADD ON

## 2015-03-31 LAB — IRON AND TIBC
Iron: 56 ug/dL (ref 28–170)
SATURATION RATIOS: 20 % (ref 10.4–31.8)
TIBC: 276 ug/dL (ref 250–450)
UIBC: 220 ug/dL

## 2015-03-31 LAB — STREP PNEUMONIAE URINARY ANTIGEN: Strep Pneumo Urinary Antigen: NEGATIVE

## 2015-03-31 LAB — TROPONIN I
TROPONIN I: 0.18 ng/mL — AB (ref <0.031)
TROPONIN I: 0.26 ng/mL — AB (ref <0.031)
TROPONIN I: 0.25 ng/mL — AB (ref <0.031)

## 2015-03-31 LAB — MRSA PCR SCREENING: MRSA BY PCR: NEGATIVE

## 2015-03-31 LAB — PHOSPHORUS: Phosphorus: 3.2 mg/dL (ref 2.5–4.6)

## 2015-03-31 LAB — RETICULOCYTES
RBC.: 3.24 MIL/uL — AB (ref 3.87–5.11)
RETIC COUNT ABSOLUTE: 64.8 10*3/uL (ref 19.0–186.0)
RETIC CT PCT: 2 % (ref 0.4–3.1)

## 2015-03-31 LAB — VITAMIN B12: Vitamin B-12: 942 pg/mL — ABNORMAL HIGH (ref 180–914)

## 2015-03-31 LAB — TSH: TSH: 2.776 u[IU]/mL (ref 0.350–4.500)

## 2015-03-31 LAB — BRAIN NATRIURETIC PEPTIDE: B Natriuretic Peptide: 2002.7 pg/mL — ABNORMAL HIGH (ref 0.0–100.0)

## 2015-03-31 LAB — FOLATE: FOLATE: 46.3 ng/mL (ref >5.9)

## 2015-03-31 LAB — MAGNESIUM
Magnesium: 1.7 mg/dL (ref 1.7–2.4)
Magnesium: 1.7 mg/dL (ref 1.7–2.4)

## 2015-03-31 LAB — FERRITIN: Ferritin: 69 ng/mL (ref 11–307)

## 2015-03-31 MED ORDER — VANCOMYCIN HCL IN DEXTROSE 750-5 MG/150ML-% IV SOLN
750.0000 mg | Freq: Two times a day (BID) | INTRAVENOUS | Status: DC
  Administered 2015-03-31 – 2015-04-03 (×6): 750 mg via INTRAVENOUS
  Filled 2015-03-31 (×8): qty 150

## 2015-03-31 MED ORDER — DEXTROSE 5 % IV SOLN
1.0000 g | INTRAVENOUS | Status: DC
  Administered 2015-04-01 – 2015-04-03 (×4): 1 g via INTRAVENOUS
  Filled 2015-03-31 (×4): qty 1

## 2015-03-31 MED ORDER — FLUCONAZOLE 100 MG PO TABS
100.0000 mg | ORAL_TABLET | Freq: Every day | ORAL | Status: DC
  Administered 2015-03-31 – 2015-04-07 (×8): 100 mg via ORAL
  Filled 2015-03-31 (×8): qty 1

## 2015-03-31 MED ORDER — RIVAROXABAN 20 MG PO TABS
20.0000 mg | ORAL_TABLET | Freq: Every day | ORAL | Status: DC
  Administered 2015-03-31 – 2015-04-02 (×4): 20 mg via ORAL
  Filled 2015-03-31 (×4): qty 1

## 2015-03-31 NOTE — Progress Notes (Signed)
PT Cancellation Note  Patient Details Name: CANIYA TAGLE MRN: [id number] DOB: [birth year]   Cancelled Treatment:    Reason Eval/Treat Not Completed: Medical issues which prohibited therapy Elevated troponin with 3rd cycle requested by MD. Per departmental guidelines will hold PT evaluation until troponin shows downward trend or is cleared by MD. Follow-up tomorrow. Please page [place] for any questions. Thanks.  Berton Mount 03/31/2015, 2:12 PM Sunday [place], [place] [phone]

## 2015-03-31 NOTE — Progress Notes (Signed)
[facility] PROGRESS NOTE  Karina Willis YNW:[id number] DOB: [birth year] DOA: 03/30/2015 PCP: Peter Swazil[place], MD    HPI/Subjective: The patient is tachypneic but otherwise appears in no acute distress, she notes pain in her RUE but denies chest pain or abdominal pain.  Assessment/Plan: Active Problems:   HYPERTENSION, BENIGN SYSTEMIC   Chronic kidney disease (CKD), stage III (moderate)   Coronary artery disease   Anemia   Elevated troponin   Acute diastolic heart failure (HCC)   Arm swelling   Chronic anticoagulation   Acute on chronic combined systolic (congestive) and diastolic (congestive) heart failure (HCC)   HCAP (healthcare-associated pneumonia)   DVT (deep venous thrombosis) (HCC)   Acute exacerbation of chronic diastolic heart failure  Continue IV Lasix. Monitor daily weight, strict I&Os. Follow renal function and electrolytes closely with diuresis Echo pending, previous 08/2014 showed EF 55-60%. Not on ACE/ARBi due to history of chronic kidney disease and improved EF, continue Isordil On home O2 Cardiology to see   HCAP (healthcare-associated pneumonia) Started on Levaquin as outpatient. Continue Vancomycin and Cefepime Oxygenating well on 2L - continue O2 Await blood cultures, sputum if obtained  Chronic kidney disease (CKD), stage III (moderate)  Currently within normal limits, however will monitor renal function daily with diuresis.   Coronary artery disease with HTN and HLD Elevated troponin - likely secondary to demand ischemia d/t acute CHF and HCAP, denies chest pain  Continue aspirin and statin  PVD s/p LLE fem-pop bypass- Evaluated 1/17 as outpatient and found to be patent. Continue ASA.  Anemia - baseline appears to be 8-9. Stable, will follow  RUE swelling - likely secondary to small hematoma in the setting of IV draw. RUE Doppler pending. Maintain arm elevation.  History of DVT (HCC) - Unclear when this occurred and if lifelong anticoagulation is  indicated. Continue Xarelto for now. The patient is bed bound.  Deconditioning: PT eval   Code Status: FULL Family Communication: None at bedside  Disposition Plan: Likely will return to SNF when stable for discharge, remains inpatient for further IV diuresis and IV antibiotics.   Consultants:  Cardiology  Procedures:  ECHO pending  Antibiotics:  Vancomycin started 1/19  Cefepime started 1/19    Objective: Filed Vitals:   03/31/15 0339 03/31/15 0822  BP: 125/71 126/62  Pulse: 105 102  Temp: 98.2 F (36.8 C)   Resp: 21     Intake/Output Summary (Last 24 hours) at 03/31/15 1041 Last data filed at 03/31/15 0841  Gross per 24 hour  Intake    630 ml  Output   1350 ml  Net   -720 ml   Filed Weights   03/30/15 2353 03/31/15 0339  Weight: 85.5 kg (188 lb 7.9 oz) 85.5 kg (188 lb 7.9 oz)    Exam:   General:  Alert and oriented, NAD  Cardiovascular: tachycardic, 2-3+ BLE edema, extremities warm to touch  Respiratory: tachypnea, appears mildly short of breath, diminished breath sounds bilaterally  Abdomen: obese, soft, nontender, nondistended  Musculoskeletal: RUE edema with ecchymosis over anterior forearm, strength BUE 5/5, BLE 4/5  Psych: cooperative, speech clear  Data Reviewed: Basic Metabolic Panel:  Recent Labs Lab 03/30/15 1616 03/31/15 0200 03/31/15 0609  NA 145  --  143  K 4.2  --  3.6  CL 108  --  106  CO2 30  --  32  GLUCOSE 140*  --  114*  BUN 21*  --  21*  CREATININE 0.99  --  0.89  CALCIUM  8.9  --  8.7*  MG  --  1.7  --   PHOS  --  3.2  --    Liver Function Tests:  Recent Labs Lab 03/31/15 0609  AST 17  ALT 17  ALKPHOS 52  BILITOT 0.9  PROT 6.0*  ALBUMIN 2.3*   No results for input(s): LIPASE, AMYLASE in the last 168 hours. No results for input(s): AMMONIA in the last 168 hours. CBC:  Recent Labs Lab 03/30/15 1616 03/31/15 0609  WBC 11.1* 10.8*  NEUTROABS 8.6* 8.0*  HGB 9.0* 8.4*  HCT 31.3* 28.1*  MCV 87.7  86.7  PLT 216 201   Cardiac Enzymes:  Recent Labs Lab 03/31/15 0210 03/31/15 0609  TROPONINI 0.18* 0.26*   BNP (last 3 results)  Recent Labs  02/22/15 1020 03/30/15 1615 03/31/15 0435  BNP 2229.9* 1747.2* 2002.7*    ProBNP (last 3 results) No results for input(s): PROBNP in the last 8760 hours.  CBG: No results for input(s): GLUCAP in the last 168 hours.  Recent Results (from the past 240 hour(s))  MRSA PCR Screening     Status: None   Collection Time: 03/31/15 12:20 AM  Result Value Ref Range Status   MRSA by PCR NEGATIVE NEGATIVE Final    Comment:        The GeneXpert MRSA Assay (FDA approved for NASAL specimens only), is one component of a comprehensive MRSA colonization surveillance program. It is not intended to diagnose MRSA infection nor to guide or monitor treatment for MRSA infections.      Studies: Dg Chest 2 View  03/30/2015  CLINICAL DATA:  Shortness of breath. Diffuse chest pain. Cough. Recently diagnosed pneumonia. EXAM: CHEST  2 VIEW COMPARISON:  02/22/2015. FINDINGS: Stable enlarged cardiac silhouette. Interval moderate-sized left pleural effusion. A moderate-sized left pleural effusion is again demonstrated. Interval increased density in the right lower lung zone and mildly increased density in the left lower lung zone. Thoracic spine and bilateral shoulder degenerative changes. IMPRESSION: 1. Interval moderate-sized right pleural effusion without significant change in a moderate-sized left pleural effusion. 2. Interval right basilar atelectasis or pneumonia. 3. Mildly increased left basilar atelectasis or pneumonia. 4. Stable cardiomegaly. Electronically Signed   By: Beckie Salts M.D.   On: 03/30/2015 16:39    Scheduled Meds: . aspirin EC  81 mg Oral Daily  . atorvastatin  20 mg Oral Daily  . [START ON 04/01/2015] ceFEPime (MAXIPIME) IV  1 g Intravenous Q24H  . furosemide  40 mg Intravenous BID  . isosorbide dinitrate  10 mg Oral TID  .  potassium chloride  10 mEq Oral Daily  . rivaroxaban  20 mg Oral Q supper  . sodium chloride  3 mL Intravenous Q12H  . vancomycin  750 mg Intravenous Q12H   Continuous Infusions:   Active Problems:   HYPERTENSION, BENIGN SYSTEMIC   Chronic kidney disease (CKD), stage III (moderate)   Coronary artery disease   Anemia   Elevated troponin   Acute diastolic heart failure (HCC)   Arm swelling   Chronic anticoagulation   Acute on chronic combined systolic (congestive) and diastolic (congestive) heart failure (HCC)   HCAP (healthcare-associated pneumonia)   DVT (deep venous thrombosis) (HCC)    Time spent: 35 minutes    Paige Horcher  PA-S wrote parts of this note.   [facility] Pager [phone]. If 7PM-7AM, please contact night-coverage at [email or web address], password [facility] 03/31/2015, 10:41 AM  LOS: 1 day     Clint Lipps, MD [facility] Pager: [phone] 03/31/2015, 1:09 PM

## 2015-03-31 NOTE — Consult Note (Signed)
Patient ID: CIA GARRETSON MRN: [id number], DOB/AGE: 11-16-1932   Admit date: 03/30/2015   Primary Physician: Peter Swaziland, MD Primary Cardiologist: Dr. Swaziland  Pt. Profile:   82 y/o female with h/o CAD, ischemic cardiomyopathy, chronic systolic CHF, moderate to severe MR, HTN, HL and prior TIA admitted for dyspnea in the setting of HCAP and acute on chronic CHF. Also with mildly elevated troponin.   Problem List  Past Medical History  Diagnosis Date  . CHF (congestive heart failure) (HCC)   . Mitral insufficiency   . Coronary artery disease 2001    WITH DOCUMENTED TOTAL OCCLUSION OF THE RIGHT CORONARY AND THE LEFT CIRCUMFLEX CORONARY  . SOB (shortness of breath)   . Hypertension   . Hyperlipidemia   . History of TIAs   . Arthritis   . Non compliance w medication regimen   . Neck pain   . PAD (peripheral artery disease) (HCC)   . Critical lower limb ischemia   . Pulmonary HTN (HCC)     Past Surgical History  Procedure Laterality Date  . Cardiac catheterization  06/05/1999    ENLARGED LEFT VENTRICULAR SIZE. THERE IS AKINESIA OF THE INFERIOR BASE. LEFT VENTRICULAR  FUNCTIONS APPEAR TO BE MODERATELY REDUCED WITH EF 35-40%  . Appendectomy    . Left arm surgery    . Peripheral vascular catheterization Left 09/21/2014    Procedure: Lower Extremity Angiography;  Surgeon: Larina Earthly, MD;  Location: [facility] INVASIVE CV LAB;  Service: Cardiovascular;  Laterality: Left;  . Femoral-popliteal bypass graft Left 09/26/2014    Procedure: LEFT FEMORAL-ABOVE THE KNEE POPLITEAL ARTERY BYPASS GRAFT USING NON REVERSE LEFT GREATER SAPHENOUS VEIN. ;  Surgeon: Larina Earthly, MD;  Location: [facility] OR;  Service: Vascular;  Laterality: Left;     Allergies  Allergies  Allergen Reactions  . Penicillins Hives and Itching    Pt reports not being allergic to penicillin Tolerates Ancef    HPI  82 y/o female, followed by Dr. Swaziland, with a h/o CAD, ischemic cardiomyopathy, chronic systolic CHF, moderate  to severe MR, HTN, HL and prior TIA. LHC 3/01: Mid LAD 50%, ostial circumflex 80%, then 99% prior to OM1, then occluded; proximal RCA occluded, EF 35-40% with inferior AK. EF has been reportedly as low as 25-30%. Repeat echo 08/2014 showed improvement in EF back to normal at 55-60%. She was seen by Dr. Swaziland on 10/31/14. In his note, he outlined that she is " Not a candidate for revascularization based on old cath data". Her CAD has been managed medically.  She was recently admitted to [facility] 12/14-12/15 w/ symptomatic anemia after dental extractions, in the setting of Xarelto use. Decision was made to discontinue Ovidio Hanger, as there was no clear reason to be on it. Cardiology was also called to evaluate her at that time due to abnormal troponin levels, which was felt to be demand ischemia in the setting of acute on chronic CHF + anemia.  Her CHF was treated with lasix. Dr. Eden Emms added LA nitrate for preload reduction and pulmonary HTN 3rd spacing some from low albumin. She was discharged to nursing facility but failed to f/u in our office for her post hospital appt.   She was readmitted to [facility] on 03/30/15 by IM for dyspnea. She presented from nursing home facility with a complaint of shortness of breath. She was diagnosed with PNA 3 days prior to arrival and was placed on Levoquin as an outpaient w/o improvement. Also with a 2 week  h/o LEE. She was treated with lasix at nursing facility but also did not improve. She has required supplemental O2 for the past 1 week.   On arrival to [facility], BNP was abnormal at 2002.7. CXR showed moderate sized-bilateral  pleural effusion + bibasilar atelectasis vs PNA. Troponin was checked and is mildly abnormal at 0.18. Cardiology consulted for recommendations regarding her CHF and abnormal troponin.     Home Medications  Prior to Admission medications   Medication Sig Start Date End Date Taking? Authorizing Provider  acetaminophen (TYLENOL) 325 MG tablet Take 2 tablets (650  mg total) by mouth every 6 (six) hours as needed for moderate pain. 08/04/13  Yes Nani Ravens, MD  albuterol (PROVENTIL) (2.5 MG/3ML) 0.083% nebulizer solution Take 2.5 mg by nebulization every 6 (six) hours as needed for wheezing or shortness of breath.   Yes Historical Provider, MD  aspirin EC 81 MG EC tablet Take 1 tablet (81 mg total) by mouth daily. 09/28/14  Yes Gust Rung, DO  aspirin-acetaminophen-caffeine (EXCEDRIN MIGRAINE) [id number] MG per tablet Take 1 tablet by mouth every 6 (six) hours as needed for headache.   Yes Historical Provider, MD  atorvastatin (LIPITOR) 20 MG tablet Take 20 mg by mouth daily.   Yes Historical Provider, MD  diphenhydrAMINE (BENADRYL) 25 MG tablet Take 25 mg by mouth every 6 (six) hours as needed for itching.   Yes Historical Provider, MD  docusate sodium (COLACE) 100 MG capsule Take 1 capsule (100 mg total) by mouth 2 (two) times daily as needed for mild constipation. 09/28/14  Yes Gust Rung, DO  folic acid (FOLVITE) 1 MG tablet Take 1 tablet (1 mg total) by mouth daily. 09/28/14  Yes Gust Rung, DO  furosemide (LASIX) 20 MG tablet Take 40 mg by mouth daily.    Yes Historical Provider, MD  guaiFENesin (MUCINEX) 600 MG 12 hr tablet Take 600 mg by mouth 2 (two) times daily. x7 days   Yes Historical Provider, MD  isosorbide dinitrate (ISORDIL) 10 MG tablet Take 1 tablet (10 mg total) by mouth 3 (three) times daily. 02/23/15  Yes Marquette Saa, MD  LORazepam (ATIVAN) 0.5 MG tablet Take 0.5 mg by mouth every 12 (twelve) hours as needed for anxiety.   Yes Historical Provider, MD  nitroGLYCERIN (NITROSTAT) 0.4 MG SL tablet Place 1 tablet (0.4 mg total) under the tongue every 5 (five) minutes as needed for chest pain. Must keep appointment 10/31/14 with Dr Swaziland 09/16/14  Yes Peter M Swaziland, MD  oxycodone (OXY-IR) 5 MG capsule Take 5 mg by mouth every 4 (four) hours as needed for pain.   Yes Historical Provider, MD  oxyCODONE-acetaminophen  (PERCOCET/ROXICET) 5-325 MG per tablet Take 1 tablet by mouth every 6 (six) hours as needed for moderate pain. 09/28/14  Yes Gust Rung, DO  phenol (CHLORASEPTIC) 1.4 % LIQD Use as directed 1 spray in the mouth or throat as needed for throat irritation / pain. 09/28/14  Yes Gust Rung, DO  polyethylene glycol (MIRALAX / GLYCOLAX) packet Take 17 g by mouth daily as needed. 09/28/14  Yes Gust Rung, DO  Potassium Chloride (KLOR-CON 10 PO) Take 10 mg by mouth daily.   Yes Historical Provider, MD  Rivaroxaban (XARELTO) 15 MG TABS tablet Take 15 mg by mouth daily with supper.   Yes Historical Provider, MD  sacubitril-valsartan (ENTRESTO) 24-26 MG Take 1 tablet by mouth 2 (two) times daily.   Yes Historical Provider, MD  vitamin B-12  1000 MCG tablet Take 1 tablet (1,000 mcg total) by mouth daily. 08/16/14  Yes Tasrif Ahmed, MD  hydrocerin (EUCERIN) CREA Apply 1 application topically 2 (two) times daily. 08/16/14   Hyacinth Meeker, MD    Family History  Family History  Problem Relation Age of Onset  . Heart attack Mother   . Heart attack Father   . Heart disease Sister   . Heart attack Brother   . Heart attack Brother   . Heart attack Brother   . Heart disease Sister     Social History  Social History   Social History  . Marital Status: Divorced    Spouse Name: N/A  . Number of Children: 0  . Years of Education: N/A   Occupational History  . waitress     retired   Social History Main Topics  . Smoking status: Former Smoker    Types: Cigarettes    Quit date: 08/21/1983  . Smokeless tobacco: Never Used  . Alcohol Use: No  . Drug Use: No  . Sexual Activity: Not on file   Other Topics Concern  . Not on file   Social History Narrative     Review of Systems General:  No chills, fever, night sweats or weight changes.  Cardiovascular:  No chest pain, dyspnea on exertion, edema, orthopnea, palpitations, paroxysmal nocturnal dyspnea. Dermatological: No rash,  lesions/masses Respiratory: No cough, dyspnea Urologic: No hematuria, dysuria Abdominal:   No nausea, vomiting, diarrhea, bright red blood per rectum, melena, or hematemesis Neurologic:  No visual changes, wkns, changes in mental status. All other systems reviewed and are otherwise negative except as noted above.  Physical Exam  Blood pressure 103/54, pulse 96, temperature 98.2 F (36.8 C), temperature source Oral, resp. rate 21, height  (1.499 m), weight 188 lb 7.9 oz (85.5 kg), SpO2 100 %.  General: Pleasant, NAD Psych: Normal affect. Neuro: Alert and oriented X 3. Moves all extremities spontaneously. HEENT: Normal  Neck: Supple without bruits or JVD. Lungs:  Resp regular and unlabored, decreased BS bilaterally  Heart: RRR no s3, s4, or murmurs. Abdomen: Soft, non-tender, non-distended, BS + x 4.  Extremities: edematous upper and lower extremities. DP/PT/Radials 2+ and equal bilaterally.  Labs  Troponin [facility] of Care Test)  Recent Labs  03/30/15 1614  TROPIPOC 0.13*    Recent Labs  03/31/15 0210 03/31/15 0609  TROPONINI 0.18* 0.26*   Lab Results  Component Value Date   WBC 10.8* 03/31/2015   HGB 8.4* 03/31/2015   HCT 28.1* 03/31/2015   MCV 86.7 03/31/2015   PLT 201 03/31/2015    Recent Labs Lab 03/31/15 0609  NA 143  K 3.6  CL 106  CO2 32  BUN 21*  CREATININE 0.89  CALCIUM 8.7*  PROT 6.0*  BILITOT 0.9  ALKPHOS 52  ALT 17  AST 17  GLUCOSE 114*   Lab Results  Component Value Date   CHOL 170 11/18/2012   HDL 58.40 11/18/2012   LDLCALC 99 11/18/2012   TRIG 62.0 11/18/2012   No results found for: DDIMER   Radiology/Studies  Dg Chest 2 View  03/30/2015  CLINICAL DATA:  Shortness of breath. Diffuse chest pain. Cough. Recently diagnosed pneumonia. EXAM: CHEST  2 VIEW COMPARISON:  02/22/2015. FINDINGS: Stable enlarged cardiac silhouette. Interval moderate-sized left pleural effusion. A moderate-sized left pleural effusion is again  demonstrated. Interval increased density in the right lower lung zone and mildly increased density in the left lower lung zone. Thoracic spine and bilateral  shoulder degenerative changes. IMPRESSION: 1. Interval moderate-sized right pleural effusion without significant change in a moderate-sized left pleural effusion. 2. Interval right basilar atelectasis or pneumonia. 3. Mildly increased left basilar atelectasis or pneumonia. 4. Stable cardiomegaly. Electronically Signed   By: Beckie Salts M.D.   On: 03/30/2015 16:39    ECG Sinus tach with PVCs  Echocardiogram- pending   ASSESSMENT AND PLAN  Active Problems:   HYPERTENSION, BENIGN SYSTEMIC   Chronic kidney disease (CKD), stage III (moderate)   Coronary artery disease   Anemia   Elevated troponin   Acute diastolic heart failure (HCC)   Arm swelling   Chronic anticoagulation   Acute on chronic combined systolic (congestive) and diastolic (congestive) heart failure (HCC)   HCAP (healthcare-associated pneumonia)   DVT (deep venous thrombosis) (HCC)   1. Acute on Chronic Diastolic CHF: h/o low EF in the past but with improvement in EF at time of last echo 08/2014, back to normal at 55-60%. Suspect diastolic CHF, however repeat 2D echo pending to reassess systolic function and valve anatomy. BNP is abnormal at 2002.7.  CXR showed moderate sized-bilateral  pleural effusion + bibasilar atelectasis vs PNA. BP is soft but stable. She is edematous but suspect her low albumin may be contributing. Albumin is 2.3 Continue lasix and isordil. Monitor renal function and K closely as well as strict I/Os and daily weights. Low sodium diet.   2. Abnormal Troponin: initial troponin is 0.18. Need to cycle x 3 to assess trend, however suspect likely demand ischemia in the setting of PNA and acute CHF. She denies CP.  She does have known CAD, however it has been documented by Dr. Swaziland that she is not a candidate for revascularization based on old cath data.    3.CAD: per above, not a candidate for PCI based on previous cath findings. She is CP free. Continue medical therapy.    Beau Fanny, PA-C 03/31/2015, 12:23 PM

## 2015-03-31 NOTE — Progress Notes (Signed)
VASCULAR LAB PRELIMINARY  PRELIMINARY  PRELIMINARY  PRELIMINARY    Bilateral lower extremity venous Doppler has been completed.  Bilateral no DVT. No superificial thrombus identified bilateral in lower extremities. No baker's cyst bilaterally. Note- exam was difficult due to edema in both legs.  Lieutenant Diego Lucretia Roers 03/31/2015

## 2015-03-31 NOTE — Progress Notes (Signed)
VASCULAR LAB PRELIMINARY  PRELIMINARY  PRELIMINARY  PRELIMINARY  Upper extremity venous Doppler has been completed.     No evidence of DVT or superficial thrombosis.    Note exam difficult due to edema.   Ryn Peine, RVT, RDMS 03/31/2015, 11:22 AM

## 2015-03-31 NOTE — Progress Notes (Signed)
ANTICOAGULATION CONSULT NOTE - Initial Consult  Pharmacy Consult for Xarelto  Indication: History of DVT  Allergies  Allergen Reactions  . Penicillins Hives and Itching    Pt reports not being allergic to penicillin Tolerates Ancef    Patient Measurements: Height:  (149.9 cm) Weight: 188 lb 7.9 oz (85.5 kg) IBW/kg (Calculated) : 43.2   Vital Signs: Temp: 98.6 F (37 C) (01/19 2353) Temp Source: Oral (01/19 2353) BP: 131/66 mmHg (01/19 2353) Pulse Rate: 109 (01/19 2353)  Labs:  Recent Labs  03/30/15 1616  HGB 9.0*  HCT 31.3*  PLT 216  CREATININE 0.99    Estimated Creatinine Clearance: 43 mL/min (by C-G formula based on Cr of 0.99).   Medical History: Past Medical History  Diagnosis Date  . CHF (congestive heart failure) (HCC)   . Mitral insufficiency   . Coronary artery disease 2001    WITH DOCUMENTED TOTAL OCCLUSION OF THE RIGHT CORONARY AND THE LEFT CIRCUMFLEX CORONARY  . SOB (shortness of breath)   . Hypertension   . Hyperlipidemia   . History of TIAs   . Arthritis   . Non compliance w medication regimen   . Neck pain   . PAD (peripheral artery disease) (HCC)   . Critical lower limb ischemia   . Pulmonary HTN (HCC)     Assessment: 82 y/o F from NH for PNA/fluid overload, Xarelto PTA for hx DVT, Hgb 9, renal function good, pt should be on 20 mg daily given indication and CrCl >30.   Goal of Therapy:  Monitor platelets by anticoagulation protocol: Yes   Plan:  -Xarelto 20 mg daily with supper -Minimum q72h CBC while inpatient -Monitor for bleeding  Abran Duke 03/31/2015,12:06 AM

## 2015-03-31 NOTE — Progress Notes (Signed)
  Echocardiogram 2D Echocardiogram has been performed.  Leta Jungling M 03/31/2015, 9:35 AM

## 2015-03-31 NOTE — Progress Notes (Signed)
Pt had 12 beats run of VT.  Pt asymptomatic.  BP 101/75, P 101.  Dr. Arthor Captain made aware.  Julio Storr, Charity fundraiser.

## 2015-03-31 NOTE — Progress Notes (Signed)
Utilization review completed. Saba Neuman, RN, BSN. 

## 2015-03-31 NOTE — Progress Notes (Signed)
Call received from Dr. Arthor Captain informing him that pt had 12 beats run of VT and he ordered 12 lead  EKG and mag level.  Orders placed in computer.  NT and nurse made aware.  Amanda Pea, Charity fundraiser.

## 2015-04-01 DIAGNOSIS — I5020 Unspecified systolic (congestive) heart failure: Secondary | ICD-10-CM

## 2015-04-01 LAB — BASIC METABOLIC PANEL
Anion gap: 5 (ref 5–15)
BUN: 21 mg/dL — AB (ref 6–20)
CO2: 31 mmol/L (ref 22–32)
CREATININE: 0.88 mg/dL (ref 0.44–1.00)
Calcium: 8.4 mg/dL — ABNORMAL LOW (ref 8.9–10.3)
Chloride: 104 mmol/L (ref 101–111)
GFR calc Af Amer: >60 mL/min (ref >60)
GLUCOSE: 174 mg/dL — AB (ref 65–99)
Potassium: 3.2 mmol/L — ABNORMAL LOW (ref 3.5–5.1)
SODIUM: 140 mmol/L (ref 135–145)

## 2015-04-01 LAB — CBC
HCT: 27 % — ABNORMAL LOW (ref 36.0–46.0)
Hemoglobin: 8 g/dL — ABNORMAL LOW (ref 12.0–15.0)
MCH: 25.9 pg — ABNORMAL LOW (ref 26.0–34.0)
MCHC: 29.6 g/dL — AB (ref 30.0–36.0)
MCV: 87.4 fL (ref 78.0–100.0)
PLATELETS: 178 10*3/uL (ref 150–400)
RBC: 3.09 MIL/uL — ABNORMAL LOW (ref 3.87–5.11)
RDW: 19.3 % — AB (ref 11.5–15.5)
WBC: 9.4 10*3/uL (ref 4.0–10.5)

## 2015-04-01 LAB — URINE CULTURE

## 2015-04-01 MED ORDER — MAGNESIUM SULFATE 2 GM/50ML IV SOLN
2.0000 g | Freq: Once | INTRAVENOUS | Status: AC
  Administered 2015-04-01: 2 g via INTRAVENOUS
  Filled 2015-04-01: qty 50

## 2015-04-01 MED ORDER — POTASSIUM CHLORIDE CRYS ER 20 MEQ PO TBCR
60.0000 meq | EXTENDED_RELEASE_TABLET | Freq: Four times a day (QID) | ORAL | Status: AC
  Administered 2015-04-01 (×2): 60 meq via ORAL
  Filled 2015-04-01: qty 3

## 2015-04-01 NOTE — Progress Notes (Addendum)
[facility] PROGRESS NOTE  Karina Willis WUJ:[id number] DOB: 08-Feb-1933 DOA: 03/30/2015 PCP: Peter Swaziland, MD    HPI/Subjective: Appears comfortable in bed, has minimal shortness of breath, cough/sputum production. Complains about right arm swelling. Watch closely, if no fever or chills my discontinue antibiotics in the morning.  Assessment/Plan: Principal Problem:   Acute diastolic heart failure (HCC) Active Problems:   HYPERTENSION, BENIGN SYSTEMIC   Chronic kidney disease (CKD), stage III (moderate)   Coronary artery disease   Anemia   Elevated troponin   Arm swelling   Chronic anticoagulation   Acute on chronic combined systolic (congestive) and diastolic (congestive) heart failure (HCC)   HCAP (healthcare-associated pneumonia)   DVT (deep venous thrombosis) (HCC)    Acute exacerbation of chronic diastolic heart failure  Presented with  SOB, lower extremity edema and BNP of 2000, also recently diagnosed with pneumonia. Started on  IV Lasix. Monitor daily weight, strict I&Os. Follow renal function and electrolytes closely with diuresis Echo pending, previous 08/2014 showed EF 55-60%.  Not on ACE/ARBi due to history of chronic kidney disease and improved EF, continue Isordil On home O2 Appreciate cardiology help, continue diuresis, follow intake/output and renal function closely.   HCAP (healthcare-associated pneumonia) Started on Levaquin as outpatient. Continue Vancomycin and Cefepime Oxygenating well on 2L - continue O2 Await blood cultures, sputum if obtained  Chronic kidney disease (CKD), stage III (moderate)  Currently within normal limits, however will monitor renal function daily with diuresis.   Coronary artery disease with HTN and HLD Elevated troponin, flat curve doubt ACS,  likely secondary to demand ischemia d/t acute CHF and HCAP, denies chest pain  Continue aspirin and statin  PVD s/p LLE fem-pop bypass- Evaluated 1/17 as outpatient and found to be  patent. Continue ASA.  Anemia - baseline appears to be 8-9. Stable, will follow  RUE swelling - likely secondary to small hematoma in the setting of IV draw. RUE Doppler pending. Maintain arm elevation.  History of DVT (HCC) - Unclear when this occurred and if lifelong anticoagulation is indicated. Continue Xarelto for now. The patient is bed bound.  Deconditioning: PT eval   Hypokalemia This is likely secondary to diuresis, replete with oral supplements. Replete magnesium as well.  Code Status: FULL Family Communication: None at bedside  Disposition Plan: Likely will return to SNF when stable for discharge, remains inpatient for further IV diuresis and IV antibiotics.   Consultants:  Cardiology  Procedures:  ECHO pending  Antibiotics:  Vancomycin started 1/19  Cefepime started 1/19    Objective: Filed Vitals:   03/31/15 2047 04/01/15 0431  BP: 131/78 116/65  Pulse: 97 93  Temp: 97.8 F (36.6 C) 97.8 F (36.6 C)  Resp: 18 18    Intake/Output Summary (Last 24 hours) at 04/01/15 1211 Last data filed at 04/01/15 0900  Gross per 24 hour  Intake   1190 ml  Output   1500 ml  Net   -310 ml   Filed Weights   03/30/15 2353 03/31/15 0339 04/01/15 0431  Weight: 85.5 kg (188 lb 7.9 oz) 85.5 kg (188 lb 7.9 oz) 84.6 kg (186 lb 8.2 oz)    Exam:   General:  Alert and oriented, NAD  Cardiovascular: tachycardic, 2-3+ BLE edema, extremities warm to touch  Respiratory: tachypnea, appears mildly short of breath, diminished breath sounds bilaterally  Abdomen: obese, soft, nontender, nondistended  Musculoskeletal: RUE edema with ecchymosis over anterior forearm, strength BUE 5/5, BLE 4/5  Psych: cooperative, speech clear  Data  Reviewed: Basic Metabolic Panel:  Recent Labs Lab 03/30/15 1616 03/31/15 0200 03/31/15 0609 03/31/15 1720 04/01/15 0336  NA 145  --  143  --  140  K 4.2  --  3.6  --  3.2*  CL 108  --  106  --  104  CO2 30  --  32  --  31  GLUCOSE  140*  --  114*  --  174*  BUN 21*  --  21*  --  21*  CREATININE 0.99  --  0.89  --  0.88  CALCIUM 8.9  --  8.7*  --  8.4*  MG  --  1.7  --  1.7  --   PHOS  --  3.2  --   --   --    Liver Function Tests:  Recent Labs Lab 03/31/15 0609  AST 17  ALT 17  ALKPHOS 52  BILITOT 0.9  PROT 6.0*  ALBUMIN 2.3*   No results for input(s): LIPASE, AMYLASE in the last 168 hours. No results for input(s): AMMONIA in the last 168 hours. CBC:  Recent Labs Lab 03/30/15 1616 03/31/15 0609 04/01/15 0336  WBC 11.1* 10.8* 9.4  NEUTROABS 8.6* 8.0*  --   HGB 9.0* 8.4* 8.0*  HCT 31.3* 28.1* 27.0*  MCV 87.7 86.7 87.4  PLT 216 201 178   Cardiac Enzymes:  Recent Labs Lab 03/31/15 0210 03/31/15 0609 03/31/15 1155  TROPONINI 0.18* 0.26* 0.25*   BNP (last 3 results)  Recent Labs  02/22/15 1020 03/30/15 1615 03/31/15 0435  BNP 2229.9* 1747.2* 2002.7*    ProBNP (last 3 results) No results for input(s): PROBNP in the last 8760 hours.  CBG: No results for input(s): GLUCAP in the last 168 hours.  Recent Results (from the past 240 hour(s))  Culture, Urine     Status: None   Collection Time: 03/31/15 12:14 AM  Result Value Ref Range Status   Specimen Description URINE, CATHETERIZED  Final   Special Requests ADDED [id number]  Final   Culture 20,000 COLONIES/mL YEAST  Final   Report Status 04/01/2015 FINAL  Final  MRSA PCR Screening     Status: None   Collection Time: 03/31/15 12:20 AM  Result Value Ref Range Status   MRSA by PCR NEGATIVE NEGATIVE Final    Comment:        The GeneXpert MRSA Assay (FDA approved for NASAL specimens only), is one component of a comprehensive MRSA colonization surveillance program. It is not intended to diagnose MRSA infection nor to guide or monitor treatment for MRSA infections.      Studies: Dg Chest 2 View  03/30/2015  CLINICAL DATA:  Shortness of breath. Diffuse chest pain. Cough. Recently diagnosed pneumonia. EXAM: CHEST  2 VIEW  COMPARISON:  02/22/2015. FINDINGS: Stable enlarged cardiac silhouette. Interval moderate-sized left pleural effusion. A moderate-sized left pleural effusion is again demonstrated. Interval increased density in the right lower lung zone and mildly increased density in the left lower lung zone. Thoracic spine and bilateral shoulder degenerative changes. IMPRESSION: 1. Interval moderate-sized right pleural effusion without significant change in a moderate-sized left pleural effusion. 2. Interval right basilar atelectasis or pneumonia. 3. Mildly increased left basilar atelectasis or pneumonia. 4. Stable cardiomegaly. Electronically Signed   By: Beckie Salts M.D.   On: 03/30/2015 16:39    Scheduled Meds: . aspirin EC  81 mg Oral Daily  . atorvastatin  20 mg Oral Daily  . ceFEPime (MAXIPIME) IV  1 g Intravenous Q24H  .  fluconazole  100 mg Oral Daily  . furosemide  40 mg Intravenous BID  . isosorbide dinitrate  10 mg Oral TID  . magnesium sulfate 1 - 4 g bolus IVPB  2 g Intravenous Once  . potassium chloride  60 mEq Oral Q6H  . rivaroxaban  20 mg Oral Q supper  . sodium chloride  3 mL Intravenous Q12H  . vancomycin  750 mg Intravenous Q12H   Continuous Infusions:   Principal Problem:   Acute diastolic heart failure (HCC) Active Problems:   HYPERTENSION, BENIGN SYSTEMIC   Chronic kidney disease (CKD), stage III (moderate)   Coronary artery disease   Anemia   Elevated troponin   Arm swelling   Chronic anticoagulation   Acute on chronic combined systolic (congestive) and diastolic (congestive) heart failure (HCC)   HCAP (healthcare-associated pneumonia)   DVT (deep venous thrombosis) (HCC)    Time spent: 35 minutes     [facility] Pager [phone]. If 7PM-7AM, please contact night-coverage at [email or web address], password [facility] 04/01/2015, 12:11 PM  LOS: 2 days     Clint Lipps, MD [facility] Pager: [phone] 04/01/2015, 12:11 PM

## 2015-04-01 NOTE — Evaluation (Signed)
Physical Therapy Evaluation Patient Details Name: Karina Willis MRN: [id number] DOB: 1932-10-23 Today's Date: 04/01/2015   History of Present Illness  82 y/o female with h/o CAD, ischemic cardiomyopathy, chronic systolic CHF, moderate to severe MR, HTN, HL and prior TIA admitted for dyspnea in the setting of HCAP and acute on chronic CHF. Also with mildly elevated troponin.   Clinical Impression  Pt admitted with above diagnosis. Pt currently with functional limitations due to the deficits listed below (see PT Problem List). Pt was unable to do much more than bed level evaluation due to pain in swollen LEs.  Pt states that SNF was using the lift with her to get her up but sometimes she was a 2 person lift.  Will place on 2 x week PT trial to work on pt sitting balance as pt desires to be able to sit EOB and wash up.  Will follow acutely. Pt will benefit from skilled PT to increase their independence and safety with mobility to allow discharge to the venue listed below.    Follow Up Recommendations SNF;Supervision/Assistance - 24 hour    Equipment Recommendations  Other (comment) (TBA)    Recommendations for Other Services       Precautions / Restrictions Precautions Precautions: Fall Restrictions Weight Bearing Restrictions: No      Mobility  Bed Mobility Overal bed mobility: Needs Assistance;+2 for physical assistance Bed Mobility: Rolling Rolling: Total assist;+2 for physical assistance         General bed mobility comments: Difficulty with rolling.  Needed assist for UE reaching and for LEs.   Transfers                    Ambulation/Gait                Stairs            Wheelchair Mobility    Modified Rankin (Stroke Patients Only)       Balance                                             Pertinent Vitals/Pain Pain Assessment: Faces Faces Pain Scale: Hurts whole lot Pain Location: bil LEs   Pain Descriptors /  Indicators: Aching;Sore;Grimacing;Guarding Pain Intervention(s): Limited activity within patient's tolerance;Monitored during session;Repositioned  VSS    Home Living Family/patient expects to be discharged to:: Skilled nursing facility                 Additional Comments: Has been at [facility] care getting therapy for a few months per pt.      Prior Function Level of Independence: Needs assistance   Gait / Transfers Assistance Needed: Pt states "2 strong girls pick her up and put her in chair" or they use a mechanical lift.  Pt does not propel the wheelchair.  ADL's / Homemaking Assistance Needed: total assist for B/D - only washes face per pt report.    Comments: Used 3LO2 in NH.       Hand Dominance   Dominant Hand: Right    Extremity/Trunk Assessment   Upper Extremity Assessment: RUE deficits/detail;LUE deficits/detail RUE Deficits / Details: Can move shoulder to 135 degrees with AA/ROM.  Grossly 2/5 strength     LUE Deficits / Details: Can only move shoulder to 85degrees due to prior shoulder injury.  Strength grossly 2-/5.  Lower Extremity Assessment: RLE deficits/detail;LLE deficits/detail RLE Deficits / Details: 1/5 strength with limited assessment due to pain.  LLE Deficits / Details: 1/5 strenght with limited movement due to pain.      Communication   Communication: No difficulties  Cognition Arousal/Alertness: Awake/alert Behavior During Therapy: Flat affect Overall Cognitive Status: Within Functional Limits for tasks assessed                      General Comments General comments (skin integrity, edema, etc.): PEr pt, she was lifted to wheelchair at SNF.  Pt desires to try PT and wants to participate.  Will give trial of PT.      Exercises General Exercises - Upper Extremity Shoulder Flexion: AAROM;Both;5 reps;Supine Elbow Flexion: AAROM;Both;5 reps;Supine General Exercises - Lower Extremity Ankle Circles/Pumps: AAROM;Both;5  reps;Supine      Assessment/Plan    PT Assessment Patient needs continued PT services  PT Diagnosis Generalized weakness   PT Problem List Decreased activity tolerance;Decreased strength;Decreased balance;Decreased mobility;Decreased knowledge of use of DME;Decreased safety awareness;Decreased knowledge of precautions;Decreased range of motion;Pain  PT Treatment Interventions DME instruction;Gait training;Functional mobility training;Therapeutic activities;Therapeutic exercise;Balance training;Patient/family education   PT Goals (Current goals can be found in the Care Plan section) Acute Rehab PT Goals Patient Stated Goal: to try PT PT Goal Formulation: With patient Time For Goal Achievement: 04/15/15 Potential to Achieve Goals: Fair    Frequency Min 2X/week   Barriers to discharge Decreased caregiver support      Co-evaluation               End of Session Equipment Utilized During Treatment: Oxygen Activity Tolerance: Patient limited by fatigue;Patient limited by pain Patient left: in bed;with call bell/phone within reach;with bed alarm set Nurse Communication: Mobility status;Need for lift equipment         Time: [phone] PT Time Calculation (min) (ACUTE ONLY): 21 min   Charges:   PT Evaluation $PT Eval Low Complexity: 1 Procedure     PT G CodesBerline Lopes 04-28-2015, 11:33 AM Eber Jones Acute Rehabilitation [phone] (pager)

## 2015-04-01 NOTE — Progress Notes (Signed)
SUBJECTIVE: The patient is doing well today.  At this time, she denies chest pain, but does have significant shortness of breath.  She also complains of leg pain due to her swelling.  She is very weak and tired, unable to sit up in the bed.  TTE shows EF of 10-15% with severely reduced RV function and severe MR.  Large plural effusion.  Marland Kitchen aspirin EC  81 mg Oral Daily  . atorvastatin  20 mg Oral Daily  . ceFEPime (MAXIPIME) IV  1 g Intravenous Q24H  . fluconazole  100 mg Oral Daily  . furosemide  40 mg Intravenous BID  . isosorbide dinitrate  10 mg Oral TID  . magnesium sulfate 1 - 4 g bolus IVPB  2 g Intravenous Once  . potassium chloride  60 mEq Oral Q6H  . rivaroxaban  20 mg Oral Q supper  . sodium chloride  3 mL Intravenous Q12H  . vancomycin  750 mg Intravenous Q12H      OBJECTIVE: Physical Exam: Filed Vitals:   03/31/15 1110 03/31/15 1555 03/31/15 2047 04/01/15 0431  BP: 103/54 101/75 131/78 116/65  Pulse: 96 100 97 93  Temp:  98.7 F (37.1 C) 97.8 F (36.6 C) 97.8 F (36.6 C)  TempSrc:  Oral Oral Oral  Resp:   18 18  Height:      Weight:    186 lb 8.2 oz (84.6 kg)  SpO2: 100%  100% 98%    Intake/Output Summary (Last 24 hours) at 04/01/15 1057 Last data filed at 04/01/15 0900  Gross per 24 hour  Intake   1430 ml  Output   1500 ml  Net    -70 ml    Telemetry reveals sinus rhythm  GEN- The patient is well appearing, alert and oriented x 3 today.   Head- normocephalic, atraumatic Eyes-  Sclera clear, conjunctiva pink Ears- hearing intact Oropharynx- clear Neck- supple, no JVP Lymph- no cervical lymphadenopathy Lungs- Clear to ausculation bilaterally, normal work of breathing Heart- Regular rate and rhythm, no murmurs, rubs or gallops, PMI not laterally displaced GI- soft, NT, ND, + BS Extremities- no clubbing, cyanosis, or edema Skin- no rash or lesion Psych- euthymic mood, full affect Neuro- strength and sensation are intact  LABS: Basic Metabolic  Panel:  Recent Labs  03/31/15 0200 03/31/15 0609 03/31/15 1720 04/01/15 0336  NA  --  143  --  140  K  --  3.6  --  3.2*  CL  --  106  --  104  CO2  --  32  --  31  GLUCOSE  --  114*  --  174*  BUN  --  21*  --  21*  CREATININE  --  0.89  --  0.88  CALCIUM  --  8.7*  --  8.4*  MG 1.7  --  1.7  --   PHOS 3.2  --   --   --    Liver Function Tests:  Recent Labs  03/31/15 0609  AST 17  ALT 17  ALKPHOS 52  BILITOT 0.9  PROT 6.0*  ALBUMIN 2.3*   No results for input(s): LIPASE, AMYLASE in the last 72 hours. CBC:  Recent Labs  03/30/15 1616 03/31/15 0609 04/01/15 0336  WBC 11.1* 10.8* 9.4  NEUTROABS 8.6* 8.0*  --   HGB 9.0* 8.4* 8.0*  HCT 31.3* 28.1* 27.0*  MCV 87.7 86.7 87.4  PLT 216 201 178   Cardiac Enzymes:  Recent Labs  03/31/15  0210 03/31/15 0609 03/31/15 1155  TROPONINI 0.18* 0.26* 0.25*   BNP: Invalid input(s): POCBNP D-Dimer: No results for input(s): DDIMER in the last 72 hours. Hemoglobin A1C: No results for input(s): HGBA1C in the last 72 hours. Fasting Lipid Panel: No results for input(s): CHOL, HDL, LDLCALC, TRIG, CHOLHDL, LDLDIRECT in the last 72 hours. Thyroid Function Tests:  Recent Labs  03/31/15 0435  TSH 2.776   Anemia Panel:  Recent Labs  03/31/15 0435 03/31/15 0609  VITAMINB12  --  942*  FOLATE 46.3  --   FERRITIN  --  69  TIBC  --  276  IRON  --  56  RETICCTPCT  --  2.0    RADIOLOGY: Dg Chest 2 View  03/30/2015  CLINICAL DATA:  Shortness of breath. Diffuse chest pain. Cough. Recently diagnosed pneumonia. EXAM: CHEST  2 VIEW COMPARISON:  02/22/2015. FINDINGS: Stable enlarged cardiac silhouette. Interval moderate-sized left pleural effusion. A moderate-sized left pleural effusion is again demonstrated. Interval increased density in the right lower lung zone and mildly increased density in the left lower lung zone. Thoracic spine and bilateral shoulder degenerative changes. IMPRESSION: 1. Interval moderate-sized right  pleural effusion without significant change in a moderate-sized left pleural effusion. 2. Interval right basilar atelectasis or pneumonia. 3. Mildly increased left basilar atelectasis or pneumonia. 4. Stable cardiomegaly. Electronically Signed   By: Beckie Salts M.D.   On: 03/30/2015 16:39    ASSESSMENT AND PLAN:  Principal Problem:   Acute diastolic heart failure (HCC) Active Problems:   HYPERTENSION, BENIGN SYSTEMIC   Chronic kidney disease (CKD), stage III (moderate)   Coronary artery disease   Anemia   Elevated troponin   Arm swelling   Chronic anticoagulation   Acute on chronic combined systolic (congestive) and diastolic (congestive) heart failure (HCC)   HCAP (healthcare-associated pneumonia)   DVT (deep venous thrombosis) (HCC) 1. Systolic CHF, possibly acute: h/o low EF in the past but with improvement in EF at time of last echo 08/2014, back to normal at 55-60%. Her EF today is significantly lower than it was over the summer, approximately 10-15%. Agree with aggressive diuresis as she continues to complain of shortness of breath and has significant lower extremity edema. She also has decreased lung sounds likely due to her pleural effusions. BNP is abnormal at 2002.7. She is edematous but suspect her low albumin may be contributing. Albumin is 2.3 Continue lasix and isordil. Monitor renal function and K closely as well as strict I/Os and daily weights. Low sodium diet. Her weight at her last clinic visit was 134 pounds. Her weight today is 186 pounds. Based off of that she does have quite a bit of fluid to lose.  2. Abnormal Troponin: initial troponin is 0.25. This is likely due to her new systolic heart failure, and unlikely due to coronary disease as she has not complained of chest pain. She does have known CAD, however it has been documented by Dr. Swaziland that she is not a candidate for revascularization based on old cath data.   3.CAD: per above, not a candidate for PCI based on  previous cath findings. She is CP free. Continue medical therapy.   4. Mitral regurgitation: Appears to be functional and due to her cardiomyopathy. This should improve as she undergoes diuresis.  5. Nonsustained VT: The patient appears to be asymptomatic from her nonsustained VT. This should improve as she undergoes further diuresis.  Aaric Dolph Jorja Loa, MD 04/01/2015 10:57 AM

## 2015-04-02 DIAGNOSIS — I5043 Acute on chronic combined systolic (congestive) and diastolic (congestive) heart failure: Secondary | ICD-10-CM

## 2015-04-02 LAB — BASIC METABOLIC PANEL
Anion gap: 9 (ref 5–15)
BUN: 19 mg/dL (ref 6–20)
CALCIUM: 8.6 mg/dL — AB (ref 8.9–10.3)
CHLORIDE: 102 mmol/L (ref 101–111)
CO2: 30 mmol/L (ref 22–32)
CREATININE: 0.82 mg/dL (ref 0.44–1.00)
GFR calc non Af Amer: >60 mL/min (ref >60)
GLUCOSE: 100 mg/dL — AB (ref 65–99)
Potassium: 3.5 mmol/L (ref 3.5–5.1)
Sodium: 141 mmol/L (ref 135–145)

## 2015-04-02 LAB — MAGNESIUM: Magnesium: 1.8 mg/dL (ref 1.7–2.4)

## 2015-04-02 MED ORDER — MAGNESIUM OXIDE 400 (241.3 MG) MG PO TABS
400.0000 mg | ORAL_TABLET | Freq: Every day | ORAL | Status: DC
  Administered 2015-04-02 – 2015-04-07 (×6): 400 mg via ORAL
  Filled 2015-04-02 (×5): qty 1

## 2015-04-02 MED ORDER — POTASSIUM CHLORIDE CRYS ER 20 MEQ PO TBCR
40.0000 meq | EXTENDED_RELEASE_TABLET | Freq: Once | ORAL | Status: AC
  Administered 2015-04-02: 40 meq via ORAL
  Filled 2015-04-02: qty 2

## 2015-04-02 MED ORDER — FUROSEMIDE 10 MG/ML IJ SOLN
80.0000 mg | Freq: Two times a day (BID) | INTRAMUSCULAR | Status: DC
  Administered 2015-04-02: 80 mg via INTRAVENOUS
  Filled 2015-04-02 (×2): qty 8

## 2015-04-02 NOTE — Progress Notes (Signed)
SUBJECTIVE: The patient is doing well today.  At this time, she denies chest pain, not complaining of shortness of breath today.  She also complains of leg pain due to her swelling.  She is very weak and tired, unable to sit up in the bed.  TTE shows EF of 10-15% with severely reduced RV function and severe MR.  Large plural effusion.  Marland Kitchen aspirin EC  81 mg Oral Daily  . atorvastatin  20 mg Oral Daily  . ceFEPime (MAXIPIME) IV  1 g Intravenous Q24H  . fluconazole  100 mg Oral Daily  . furosemide  40 mg Intravenous BID  . isosorbide dinitrate  10 mg Oral TID  . rivaroxaban  20 mg Oral Q supper  . sodium chloride  3 mL Intravenous Q12H  . vancomycin  750 mg Intravenous Q12H      OBJECTIVE: Physical Exam: Filed Vitals:   04/01/15 0431 04/01/15 1230 04/01/15 2015 04/02/15 0325  BP: 116/65 123/65 120/84 111/69  Pulse: 93 94 85 98  Temp: 97.8 F (36.6 C) 98.2 F (36.8 C) 97.4 F (36.3 C) 98 F (36.7 C)  TempSrc: Oral Oral Oral Oral  Resp: Height:      Weight: 186 lb 8.2 oz (84.6 kg)   186 lb 1.1 oz (84.4 kg)  SpO2: 98% 100% 98% 98%    Intake/Output Summary (Last 24 hours) at 04/02/15 1006 Last data filed at 04/02/15 0645  Gross per 24 hour  Intake    590 ml  Output    300 ml  Net    290 ml    Telemetry reveals sinus rhythm  GEN- The patient is well appearing, alert and oriented x 3 today.   Head- normocephalic, atraumatic Eyes-  Sclera clear, conjunctiva pink Ears- hearing intact Oropharynx- clear Neck- supple, no JVP Lymph- no cervical lymphadenopathy Lungs- Clear to ausculation bilaterally, normal work of breathing Heart- Regular rate and rhythm, no murmurs, rubs or gallops, PMI not laterally displaced GI- soft, NT, ND, + BS Extremities- no clubbing, cyanosis, or edema Skin- no rash or lesion Psych- euthymic mood, full affect Neuro- strength and sensation are intact  LABS: Basic Metabolic Panel:  Recent Labs  16/10/96 0200  03/31/15 1720  04/01/15 0336 04/02/15 0428  NA  --   < >  --  140 141  K  --   < >  --  3.2* 3.5  CL  --   < >  --  104 102  CO2  --   < >  --  31 30  GLUCOSE  --   < >  --  174* 100*  BUN  --   < >  --  21* 19  CREATININE  --   < >  --  0.88 0.82  CALCIUM  --   < >  --  8.4* 8.6*  MG 1.7  --  1.7  --  1.8  PHOS 3.2  --   --   --   --   < > = values in this interval not displayed. Liver Function Tests:  Recent Labs  03/31/15 0609  AST 17  ALT 17  ALKPHOS 52  BILITOT 0.9  PROT 6.0*  ALBUMIN 2.3*   No results for input(s): LIPASE, AMYLASE in the last 72 hours. CBC:  Recent Labs  03/30/15 1616 03/31/15 0609 04/01/15 0336  WBC 11.1* 10.8* 9.4  NEUTROABS 8.6* 8.0*  --   HGB 9.0* 8.4* 8.0*  HCT 31.3* 28.1* 27.0*  MCV 87.7 86.7 87.4  PLT 216 201 178   Cardiac Enzymes:  Recent Labs  03/31/15 0210 03/31/15 0609 03/31/15 1155  TROPONINI 0.18* 0.26* 0.25*   BNP: Invalid input(s): POCBNP D-Dimer: No results for input(s): DDIMER in the last 72 hours. Hemoglobin A1C: No results for input(s): HGBA1C in the last 72 hours. Fasting Lipid Panel: No results for input(s): CHOL, HDL, LDLCALC, TRIG, CHOLHDL, LDLDIRECT in the last 72 hours. Thyroid Function Tests:  Recent Labs  03/31/15 0435  TSH 2.776   Anemia Panel:  Recent Labs  03/31/15 0435 03/31/15 0609  VITAMINB12  --  942*  FOLATE 46.3  --   FERRITIN  --  69  TIBC  --  276  IRON  --  56  RETICCTPCT  --  2.0    RADIOLOGY: Dg Chest 2 View  03/30/2015  CLINICAL DATA:  Shortness of breath. Diffuse chest pain. Cough. Recently diagnosed pneumonia. EXAM: CHEST  2 VIEW COMPARISON:  02/22/2015. FINDINGS: Stable enlarged cardiac silhouette. Interval moderate-sized left pleural effusion. A moderate-sized left pleural effusion is again demonstrated. Interval increased density in the right lower lung zone and mildly increased density in the left lower lung zone. Thoracic spine and bilateral shoulder degenerative changes.  IMPRESSION: 1. Interval moderate-sized right pleural effusion without significant change in a moderate-sized left pleural effusion. 2. Interval right basilar atelectasis or pneumonia. 3. Mildly increased left basilar atelectasis or pneumonia. 4. Stable cardiomegaly. Electronically Signed   By: Beckie Salts M.D.   On: 03/30/2015 16:39    ASSESSMENT AND PLAN:  Principal Problem:   Acute diastolic heart failure (HCC) Active Problems:   HYPERTENSION, BENIGN SYSTEMIC   Chronic kidney disease (CKD), stage III (moderate)   Coronary artery disease   Anemia   Elevated troponin   Arm swelling   Chronic anticoagulation   Acute on chronic combined systolic (congestive) and diastolic (congestive) heart failure (HCC)   HCAP (healthcare-associated pneumonia)   DVT (deep venous thrombosis) (HCC)  1. Systolic CHF, possibly acute: h/o low EF in the past but with improvement in EF at time of last echo 08/2014, back to normal at 55-60%. Her EF today is significantly lower than it was over the summer, approximately 10-15%. Creatinine as been stable with diuresis but she has not had much fluid output.  Eulala Newcombe increase lasix dose today for more aggressive diuresis.  2. Abnormal Troponin: initial troponin is 0.25. This is likely due to her new systolic heart failure, and unlikely due to coronary disease as she has not complained of chest pain. She does have known CAD, however it has been documented by Dr. Swaziland that she is not a candidate for revascularization based on old cath data.   3.CAD: per above, not a candidate for PCI based on previous cath findings. She is CP free. Continue medical therapy.   4. Mitral regurgitation: Appears to be functional and due to her cardiomyopathy. This should improve as she undergoes diuresis.  5. Nonsustained VT: The patient appears to be asymptomatic from her nonsustained VT. This should improve as she undergoes further diuresis.  Uvaldo Rybacki Jorja Loa, MD 04/02/2015 10:06  AM

## 2015-04-02 NOTE — Progress Notes (Signed)
Attempted to meet with Pt.  Pt not able to stay awake to participate in assessment.  Tried to reach niece at number listed in Pt's chart.  No answer.  Providence Crosby, LCSW Clinical Social Work [phone]

## 2015-04-02 NOTE — NC FL2 (Signed)
[place] MEDICAID [id number] LEVEL OF CARE SCREENING TOOL     IDENTIFICATION  Patient Name: Karina Willis Birthdate: [birth year] Sex: female Admission Date (Current Location): 03/30/2015  [facility] and [place] Number:  Producer, television/film/video and Address:  [facility], [address] [place], [place] [address]      Provider Number: [id number]  Attending Physician Name and Address:  Clydia Llano, MD  Relative Name and Phone Number:       Current Level of Care: Hospital Recommended Level of Care: Nursing Facility Prior Approval Number:    Date Approved/Denied:   PASRR Number:    Discharge Plan: Home    Current Diagnoses: Patient Active Problem List   Diagnosis Date Noted  . CAP (community acquired pneumonia) 03/30/2015  . Arm swelling 03/30/2015  . Chronic anticoagulation 03/30/2015  . Acute on chronic combined systolic (congestive) and diastolic (congestive) heart failure (HCC) 03/30/2015  . HCAP (healthcare-associated pneumonia) 03/30/2015  . DVT (deep venous thrombosis) (HCC) 03/30/2015  . PVD (peripheral vascular disease) (HCC) 03/28/2015  . Aftercare following surgery of the circulatory system 03/28/2015  . Swelling of limb 03/28/2015  . Chest pain   . Acute on chronic congestive heart failure (HCC)   . Elevated troponin 02/22/2015  . Acute diastolic heart failure (HCC) 02/22/2015  . Symptomatic anemia   . Pulmonary HTN (HCC) 10/31/2014  . Severe peripheral arterial disease (HCC) 09/28/2014  . Pressure ulcer 09/23/2014  . B12 deficiency 08/15/2014  . Moderate malnutrition (HCC) 08/15/2014  . Prediabetes 08/15/2014  . Cellulitis of great toe of left foot 08/15/2014  . Hypotension 08/15/2014  . AKI (acute kidney injury) (HCC) 08/14/2014  . Hypovolemia 08/14/2014  . Anemia 08/03/2013  . Bilateral hip pain 08/01/2013  . Fall at home 08/01/2013  . Chronic diastolic CHF (congestive heart failure) (HCC)   . Mitral insufficiency   . Coronary  artery disease   . SOB (shortness of breath)   . History of TIAs   . Arthritis   . B12 deficiency anemia 04/24/2009  . UNSPECIFIED VITAMIN D DEFICIENCY 06/13/2008  . Chronic kidney disease (CKD), stage III (moderate) 06/13/2008  . POSTMENOPAUSAL STATUS 06/03/2008  . KNEE PAIN, LEFT 12/03/2007  . HYPERCHOLESTEROLEMIA 05/08/2006  . OBESITY, NOS 05/08/2006  . HYPERTENSION, BENIGN SYSTEMIC 05/08/2006  . COR PULMONALE 05/08/2006  . Mitral valve disorder 05/08/2006  . OSTEOARTHRITIS, MULTI SITES 05/08/2006    Orientation RESPIRATION BLADDER Height & Weight    Self, Time, Situation, Place  Normal Incontinent   186 lbs.  BEHAVIORAL SYMPTOMS/MOOD NEUROLOGICAL BOWEL NUTRITION STATUS      Continent  (2 gram sodium)  AMBULATORY STATUS COMMUNICATION OF NEEDS Skin   Limited Assist Verbally Normal                       Personal Care Assistance Level of Assistance  Bathing, Feeding, Dressing Bathing Assistance: Limited assistance Feeding assistance: Limited assistance Dressing Assistance: Limited assistance     Functional Limitations Info             SPECIAL CARE FACTORS FREQUENCY                       Contractures      Additional Factors Info  Allergies               Current Medications (04/02/2015):  This is the current hospital active medication list Current Facility-Administered Medications  Medication Dose Route Frequency Provider  Last Rate Last Dose  . 0.9 %  sodium chloride infusion  250 mL Intravenous PRN Therisa Doyne, MD      . albuterol (PROVENTIL) (2.5 MG/3ML) 0.083% nebulizer solution 2.5 mg  2.5 mg Nebulization Q6H PRN Therisa Doyne, MD      . aspirin EC tablet 81 mg  81 mg Oral Daily Therisa Doyne, MD   81 mg at 04/01/15 1000  . atorvastatin (LIPITOR) tablet 20 mg  20 mg Oral Daily Therisa Doyne, MD   20 mg at 04/01/15 1000  . ceFEPIme (MAXIPIME) 1 g in dextrose 5 % 50 mL IVPB  1 g Intravenous Q24H Clydia Llano, MD   1 g at  04/02/15 0230  . docusate sodium (COLACE) capsule 100 mg  100 mg Oral BID PRN Therisa Doyne, MD      . fluconazole (DIFLUCAN) tablet 100 mg  100 mg Oral Daily Clydia Llano, MD   100 mg at 04/01/15 1000  . furosemide (LASIX) injection 80 mg  80 mg Intravenous BID Will Jorja Loa, MD      . isosorbide dinitrate (ISORDIL) tablet 10 mg  10 mg Oral TID Therisa Doyne, MD   10 mg at 04/01/15 2315  . LORazepam (ATIVAN) tablet 0.5 mg  0.5 mg Oral Q12H PRN Therisa Doyne, MD   0.5 mg at 04/02/15 0421  . magnesium oxide (MAG-OX) tablet 400 mg  400 mg Oral Daily Clydia Llano, MD      . oxyCODONE-acetaminophen (PERCOCET/ROXICET) 5-325 MG per tablet 1 tablet  1 tablet Oral Q6H PRN Therisa Doyne, MD   1 tablet at 04/02/15 0439  . polyethylene glycol (MIRALAX / GLYCOLAX) packet 17 g  17 g Oral Daily PRN Therisa Doyne, MD      . potassium chloride SA (K-DUR,KLOR-CON) CR tablet 40 mEq  40 mEq Oral Once Clydia Llano, MD      . rivaroxaban (XARELTO) tablet 20 mg  20 mg Oral Q supper Stevphen Rochester, RPH   20 mg at 04/01/15 1700  . sodium chloride 0.9 % injection 3 mL  3 mL Intravenous Q12H Therisa Doyne, MD   3 mL at 04/01/15 2316  . sodium chloride 0.9 % injection 3 mL  3 mL Intravenous PRN Therisa Doyne, MD      . vancomycin (VANCOCIN) IVPB 750 mg/150 ml premix  750 mg Intravenous Q12H Emi Holes, RPH   750 mg at 04/02/15 1610     Discharge Medications: Please see discharge summary for a list of discharge medications.  Relevant Imaging Results:  Relevant Lab Results:   Additional Information    Lodema Hong Raynelle Highland, LCSW

## 2015-04-02 NOTE — Progress Notes (Signed)
[facility] PROGRESS NOTE  Karina Willis ZOX:[id number] DOB: 24-Apr-1932 DOA: 03/30/2015 PCP: Peter Swazil[place], MD    HPI/Subjective: Still short of breath, not much of urine output listed for yesterday. Cardiology increased Lasix to 80 mg twice a day.  Assessment/Plan: Principal Problem:   Acute diastolic heart failure (HCC) Active Problems:   HYPERTENSION, BENIGN SYSTEMIC   Chronic kidney disease (CKD), stage III (moderate)   Coronary artery disease   Anemia   Elevated troponin   Arm swelling   Chronic anticoagulation   Acute on chronic combined systolic (congestive) and diastolic (congestive) heart failure (HCC)   HCAP (healthcare-associated pneumonia)   DVT (deep venous thrombosis) (HCC)    Acute exacerbation of chronic diastolic heart failure  Presented with  SOB, lower extremity edema and BNP of 2000, also recently diagnosed with pneumonia. Started on  IV Lasix. Monitor daily weight, strict I&Os. Follow renal function and electrolytes closely with diuresis Echo pending, previous 08/2014 showed EF 55-60%.  Not on ACE/ARBi due to history of chronic kidney disease and improved EF, continue Isordil On home O2 Appreciate cardiology help, continue diuresis, follow intake/output and renal function closely.  Not much of urine output, Lasix increased to 80 twice a day.   HCAP (healthcare-associated pneumonia) Started on Levaquin as outpatient. Continue Vancomycin and Cefepime Oxygenating well on 2L - continue O2 Await blood cultures, sputum if obtained  Chronic respiratory failure Patient is on 2-3 L of oxygen at home. Currently on same.   Chronic kidney disease (CKD), stage III (moderate)  Currently within normal limits, however will monitor renal function daily with diuresis.   Coronary artery disease with HTN and HLD Elevated troponin, flat curve doubt ACS,  likely secondary to demand ischemia d/t acute CHF and HCAP, denies chest pain  Continue aspirin and  statin  PVD s/p LLE fem-pop bypass- Evaluated 1/17 as outpatient and found to be patent. Continue ASA.  Anemia - baseline appears to be 8-9. Stable, will follow  RUE swelling - likely secondary to small hematoma in the setting of IV draw. RUE Doppler pending. Maintain arm elevation.  History of DVT (HCC) - Unclear when this occurred and if lifelong anticoagulation is indicated. Continue Xarelto for now. The patient is bed bound.  Deconditioning: PT eval   Hypokalemia This is likely secondary to diuresis, replete with oral supplements. Replete magnesium as well.  ? UTI Urinalysis showed rare bacteria and yeast on admission, she is on antibiotics already. Diflucan added yesterday.  Code Status: FULL Family Communication: None at bedside  Disposition Plan: Likely will return to SNF when stable for discharge, remains inpatient for further IV diuresis and IV antibiotics.   Consultants:  Cardiology  Procedures:  ECHO pending  Antibiotics:  Vancomycin started 1/19  Cefepime started 1/19    Objective: Filed Vitals:   04/01/15 2015 04/02/15 0325  BP: 120/84 111/69  Pulse: 85 98  Temp: 97.4 F (36.3 C) 98 F (36.7 C)  Resp: 18 18    Intake/Output Summary (Last 24 hours) at 04/02/15 1233 Last data filed at 04/02/15 1052  Gross per 24 hour  Intake    830 ml  Output    300 ml  Net    530 ml   Filed Weights   03/31/15 0339 04/01/15 0431 04/02/15 0325  Weight: 85.5 kg (188 lb 7.9 oz) 84.6 kg (186 lb 8.2 oz) 84.4 kg (186 lb 1.1 oz)    Exam:   General:  Alert and oriented, NAD  Cardiovascular: tachycardic, 2-3+ BLE  edema, extremities warm to touch  Respiratory: tachypnea, appears mildly short of breath, diminished breath sounds bilaterally  Abdomen: obese, soft, nontender, nondistended  Musculoskeletal: RUE edema with ecchymosis over anterior forearm, strength BUE 5/5, BLE 4/5  Psych: cooperative, speech clear  Data Reviewed: Basic Metabolic  Panel:  Recent Labs Lab 03/30/15 1616 03/31/15 0200 03/31/15 0609 03/31/15 1720 04/01/15 0336 04/02/15 0428  NA 145  --  143  --  140 141  K 4.2  --  3.6  --  3.2* 3.5  CL 108  --  106  --  104 102  CO2 30  --  32  --  31 30  GLUCOSE 140*  --  114*  --  174* 100*  BUN 21*  --  21*  --  21* 19  CREATININE 0.99  --  0.89  --  0.88 0.82  CALCIUM 8.9  --  8.7*  --  8.4* 8.6*  MG  --  1.7  --  1.7  --  1.8  PHOS  --  3.2  --   --   --   --    Liver Function Tests:  Recent Labs Lab 03/31/15 0609  AST 17  ALT 17  ALKPHOS 52  BILITOT 0.9  PROT 6.0*  ALBUMIN 2.3*   No results for input(s): LIPASE, AMYLASE in the last 168 hours. No results for input(s): AMMONIA in the last 168 hours. CBC:  Recent Labs Lab 03/30/15 1616 03/31/15 0609 04/01/15 0336  WBC 11.1* 10.8* 9.4  NEUTROABS 8.6* 8.0*  --   HGB 9.0* 8.4* 8.0*  HCT 31.3* 28.1* 27.0*  MCV 87.7 86.7 87.4  PLT 216 201 178   Cardiac Enzymes:  Recent Labs Lab 03/31/15 0210 03/31/15 0609 03/31/15 1155  TROPONINI 0.18* 0.26* 0.25*   BNP (last 3 results)  Recent Labs  02/22/15 1020 03/30/15 1615 03/31/15 0435  BNP 2229.9* 1747.2* 2002.7*    ProBNP (last 3 results) No results for input(s): PROBNP in the last 8760 hours.  CBG: No results for input(s): GLUCAP in the last 168 hours.  Recent Results (from the past 240 hour(s))  Culture, Urine     Status: None   Collection Time: 03/31/15 12:14 AM  Result Value Ref Range Status   Specimen Description URINE, CATHETERIZED  Final   Special Requests ADDED [id number]  Final   Culture 20,000 COLONIES/mL YEAST  Final   Report Status 04/01/2015 FINAL  Final  MRSA PCR Screening     Status: None   Collection Time: 03/31/15 12:20 AM  Result Value Ref Range Status   MRSA by PCR NEGATIVE NEGATIVE Final    Comment:        The GeneXpert MRSA Assay (FDA approved for NASAL specimens only), is one component of a comprehensive MRSA colonization surveillance program.  It is not intended to diagnose MRSA infection nor to guide or monitor treatment for MRSA infections.   Culture, blood (Routine X 2) w Reflex to ID Panel     Status: None (Preliminary result)   Collection Time: 03/31/15  1:47 AM  Result Value Ref Range Status   Specimen Description BLOOD RIGHT HAND  Final   Special Requests IN PEDIATRIC BOTTLE  Final   Culture NO GROWTH 1 DAY  Final   Report Status PENDING  Incomplete  Culture, blood (Routine X 2) w Reflex to ID Panel     Status: None (Preliminary result)   Collection Time: 03/31/15  8:30 AM  Result Value Ref  Range Status   Specimen Description BLOOD LEFT ANTECUBITAL  Final   Special Requests BOTTLES DRAWN AEROBIC ONLY 5CC  Final   Culture NO GROWTH 1 DAY  Final   Report Status PENDING  Incomplete     Studies: No results found.  Scheduled Meds: . aspirin EC  81 mg Oral Daily  . atorvastatin  20 mg Oral Daily  . ceFEPime (MAXIPIME) IV  1 g Intravenous Q24H  . fluconazole  100 mg Oral Daily  . furosemide  80 mg Intravenous BID  . isosorbide dinitrate  10 mg Oral TID  . rivaroxaban  20 mg Oral Q supper  . sodium chloride  3 mL Intravenous Q12H  . vancomycin  750 mg Intravenous Q12H   Continuous Infusions:   Principal Problem:   Acute diastolic heart failure (HCC) Active Problems:   HYPERTENSION, BENIGN SYSTEMIC   Chronic kidney disease (CKD), stage III (moderate)   Coronary artery disease   Anemia   Elevated troponin   Arm swelling   Chronic anticoagulation   Acute on chronic combined systolic (congestive) and diastolic (congestive) heart failure (HCC)   HCAP (healthcare-associated pneumonia)   DVT (deep venous thrombosis) (HCC)    Time spent: 35 minutes     [facility] Pager [phone]. If 7PM-7AM, please contact night-coverage at [email or web address], password [facility] 04/02/2015, 12:33 PM  LOS: 3 days     Clint Lipps, MD [facility] Pager: [phone] 04/02/2015, 12:33 PM

## 2015-04-02 NOTE — Progress Notes (Signed)
Pharmacy Antibiotic Follow-up Note  Karina Willis is a 82 y.o. year-old female admitted on 03/30/2015.  The patient is currently on day 4 of vancomycin and cefepime for ?pneumonia.  Assessment/Plan: This patient's current antibiotics will be continued without adjustments.   Please consider at least discontinuing vancomycin as MRSA PCR negative, urine culture negative, blood cultures negative to date. Patient is afebrile and wbc is wnl. Also, fluconazole is likely not needed as typically do not treat yeast in the urine and her UA was completely normal.   Temp (24hrs), Avg:98 F (36.7 C), Min:97.4 F (36.3 C), Max:98.5 F (36.9 C)   Recent Labs Lab 03/30/15 1616 03/31/15 0609 04/01/15 0336  WBC 11.1* 10.8* 9.4    Recent Labs Lab 03/30/15 1616 03/31/15 0609 04/01/15 0336 04/02/15 0428  CREATININE 0.99 0.89 0.88 0.82   Estimated Creatinine Clearance: 51.6 mL/min (by C-G formula based on Cr of 0.82).    Allergies  Allergen Reactions  . Penicillins Hives and Itching    Pt reports not being allergic to penicillin Tolerates Ancef    Antimicrobials this admission: Cefepime 1/19 >>  Vancomycin 1/19 >> (1/28) Fluconazole 1/19 >>  Microbiology results: 1/20 BCx: NGTD 1/20 UCx: negative  1/19 MRSA PCR: negative  Thank you for allowing pharmacy to be a part of this patient's care.  Marely Apgar L. Roseanne Reno, PharmD PGY2 Infectious Diseases Pharmacy Resident Pager: [phone] 04/02/2015 1:59 PM

## 2015-04-03 ENCOUNTER — Inpatient Hospital Stay (HOSPITAL_COMMUNITY): Payer: Medicare Other

## 2015-04-03 DIAGNOSIS — N183 Chronic kidney disease, stage 3 (moderate): Secondary | ICD-10-CM

## 2015-04-03 LAB — BASIC METABOLIC PANEL
ANION GAP: 11 (ref 5–15)
BUN: 26 mg/dL — ABNORMAL HIGH (ref 6–20)
CHLORIDE: 105 mmol/L (ref 101–111)
CO2: 24 mmol/L (ref 22–32)
Calcium: 8.7 mg/dL — ABNORMAL LOW (ref 8.9–10.3)
Creatinine, Ser: 1.07 mg/dL — ABNORMAL HIGH (ref 0.44–1.00)
GFR calc non Af Amer: 48 mL/min — ABNORMAL LOW (ref >60)
GFR, EST AFRICAN AMERICAN: 55 mL/min — AB (ref >60)
Glucose, Bld: 93 mg/dL (ref 65–99)
POTASSIUM: 5.4 mmol/L — AB (ref 3.5–5.1)
SODIUM: 140 mmol/L (ref 135–145)

## 2015-04-03 LAB — LEGIONELLA ANTIGEN, URINE

## 2015-04-03 MED ORDER — FUROSEMIDE 10 MG/ML IJ SOLN: 40.0000 mg | Freq: Two times a day (BID) | INTRAMUSCULAR | Status: DC

## 2015-04-03 MED ORDER — FUROSEMIDE 10 MG/ML IJ SOLN
40.0000 mg | Freq: Once | INTRAMUSCULAR | Status: DC
  Filled 2015-04-03 (×2): qty 4

## 2015-04-03 MED ORDER — FUROSEMIDE 10 MG/ML IJ SOLN: 80.0000 mg | Freq: Two times a day (BID) | INTRAMUSCULAR | Status: DC

## 2015-04-03 MED ORDER — SODIUM CHLORIDE 0.9 % IJ SOLN
10.0000 mL | INTRAMUSCULAR | Status: DC | PRN
  Administered 2015-04-06 – 2015-04-07 (×4): 10 mL
  Filled 2015-04-03 (×4): qty 40

## 2015-04-03 MED ORDER — VANCOMYCIN HCL IN DEXTROSE 750-5 MG/150ML-% IV SOLN
750.0000 mg | Freq: Two times a day (BID) | INTRAVENOUS | Status: DC
  Administered 2015-04-04: 750 mg via INTRAVENOUS
  Filled 2015-04-03 (×3): qty 150

## 2015-04-03 MED ORDER — SODIUM CHLORIDE 0.9 % IV BOLUS (SEPSIS)
250.0000 mL | Freq: Once | INTRAVENOUS | Status: AC
  Administered 2015-04-03: 250 mL via INTRAVENOUS

## 2015-04-03 NOTE — Care Management Important Message (Signed)
Important Message  Patient Details  Name: AJIAH MCGLINN MRN: [id number] Date of Birth: 09-Oct-1932   Medicare Important Message Given:  Yes    Oralia Rud Zein Helbing 04/03/2015, 2:36 PM

## 2015-04-03 NOTE — Progress Notes (Signed)
Pharmacist Heart Failure Core Measure Documentation  Assessment: Karina Willis has an EF documented as 25-30% on 03/31/15 by ECHO.  Rationale: Heart failure patients with left ventricular systolic dysfunction (LVSD) and an EF < 40% should be prescribed an angiotensin converting enzyme inhibitor (ACEI) or angiotensin receptor blocker (ARB) at discharge unless a contraindication is documented in the medical record.  This patient is not currently on an ACEI or ARB for HF.  This note is being placed in the record in order to provide documentation that a contraindication to the use of these agents is present for this encounter.  ACE Inhibitor or Angiotensin Receptor Blocker is contraindicated (specify all that apply)    ACEI allergy AND ARB allergy   Angioedema   Moderate or severe aortic stenosis   Hyperkalemia   Hypotension   Renal artery stenosis   Worsening renal function, preexisting renal disease or dysfunction    Karina Willis, PharmD, BCPS Pager:  [phone] 04/03/2015, 2:36 PM

## 2015-04-03 NOTE — Progress Notes (Signed)
[facility] PROGRESS NOTE  Karina Willis WUJ:[id number] DOB: May 01, 1932 DOA: 03/30/2015 PCP: Peter Sw[place], MD    HPI/Subjective: No significant improvement today, still with conversational dyspnea and no change in peripheral edema. She reports feeling generally unwell and short of breath but denies chest pain or abdominal pain.   Assessment/Plan: Principal Problem:   Acute diastolic heart failure (HCC) Active Problems:   HYPERTENSION, BENIGN SYSTEMIC   Chronic kidney disease (CKD), stage III (moderate)   Coronary artery disease   Anemia   Elevated troponin   Arm swelling   Chronic anticoagulation   Acute on chronic combined systolic (congestive) and diastolic (congestive) heart failure (HCC)   HCAP (healthcare-associated pneumonia)   DVT (deep venous thrombosis) (HCC)   Acute exacerbation of chronic diastolic & systolic heart failure  Presented with SOB, lower extremity edema and BNP of 2000, also recently diagnosed with pneumonia. Weight only down <2lbs since admission. Follow daily weight, strict I&Os. Monitor renal function and electrolytes daily Echo shows EF reduced from previous 08/2014 at 55-60% now down to 25-30% Not on ACE/ARBi due to history of chronic kidney disease and improved EF, continue Isordil Appreciate cardiology recommendations Lasix decreased from 80 twice a day to 40 twice a day,  continue to monitor urine output and weight. Reinsert Foley catheter for closer monitoring of urine output, has had only 4 50 mL for the past 24 hours.   HCAP (healthcare-associated pneumonia) Started on Levaquin as outpatient. Continue Vancomycin and Cefepime Blood cultures negative to date, follow  Chronic respiratory failure Patient is on 2-3 L of oxygen at home. Currently oxygenating well on 3L.   Chronic kidney disease (CKD), stage III (moderate)  Up to 1.07 after Lasix increased, will continue to monitor renal function daily with diuresis.   Coronary artery  disease with HTN and HLD Elevated troponin, flat curve doubt ACS,  likely secondary to demand ischemia d/t acute CHF and HCAP Patient denies chest pain Continue ASA and statin  PVD s/p LLE fem-pop bypass- Evaluated 1/17 as outpatient and found to be patent. Continue ASA.  Anemia - baseline appears to be 8-9. Down 8.4 >>8.0 but no e/o active bleeding, monitor.  RUE swelling - likely 2/2 small hematoma from IV draw. RUE Doppler negative for DVT. Maintain arm elevation.  History of DVT (HCC) - Unclear when this occurred and if lifelong anticoagulation is indicated. D/c Xarelto if ok with Cardiology. The patient is bed bound, consider switch to prophylactic lovenox for this admission.  Deconditioning: PT to eval and treat  Hypokalemia, resolved, now hyperkalemic Likely secondary to diuresis, repleted, now up to 5.4 - will hold K supplements and check BMP tomorrow  ? UTI Urinalysis showed rare bacteria and yeast on admission, she is on antibiotics already. Continue Diflucan  Code Status: FULL Family Communication: None at bedside  Disposition Plan: Likely will return to SNF when stable for discharge, remains inpatient for further IV diuresis and IV antibiotics.   Consultants:  Cardiology  Procedures:  ECHO 03/31/2015 Study Conclusions - Left ventricle: The cavity size was normal. Systolic function was severely reduced. The estimated ejection fraction was in therange of 25% to 30%. Diffuse hypokinesis. Features are consistentwith a pseudonormal left ventricular filling pattern, with concomitant abnormal relaxation and increased filling pressure (grade 2 diastolic dysfunction). - Ventricular septum: The contour showed diastolic flattening. - Aortic valve: Valve area (Vmax): 0.9 cm^2. - Mitral valve: There was moderate regurgitation directedeccentrically and toward the septum. - Left atrium: The atrium was moderately to severely  dilated. - Right ventricle: The cavity size was  moderately dilated. Systolicfunction was moderately reduced. - Right atrium: The atrium was mildly dilated. - Tricuspid valve: There was severe regurgitation. - Pulmonic valve: There was moderate regurgitation. - Pulmonary arteries: Systolic pressure was moderately increased.PA peak pressure: 62 mm Hg (S). - Pericardium, extracardiac: A trivial pericardial effusion wasidentified. There was a left pleural effusion.  Antimicrobials:  Vancomycin started 1/19  Cefepime started 1/19  Diflucan started 1/20    Objective: Filed Vitals:   04/02/15 2352 04/03/15 0443  BP: 98/62 103/61  Pulse: 98 102  Temp:  97.9 F (36.6 C)  Resp: 18 18    Intake/Output Summary (Last 24 hours) at 04/03/15 1004 Last data filed at 04/03/15 1610  Gross per 24 hour  Intake   1250 ml  Output    450 ml  Net    800 ml   Filed Weights   04/01/15 0431 04/02/15 0325 04/03/15 0443  Weight: 84.6 kg (186 lb 8.2 oz) 84.4 kg (186 lb 1.1 oz) 84.8 kg (186 lb 15.2 oz)    Exam:   General:  Alert and oriented, NAD  Cardiovascular: RRR, 2-3+ BLE edema  Respiratory: tachypnea, conversational dyspnea, diminished breath sounds bilaterally  Abdomen: obese, soft, nontender, nondistended  Musculoskeletal: RUE edema with ecchymosis over anterior forearm  Psych: cooperative, speech clear  Skin: Back examined - no obvious sacral ulcer   Data Reviewed: Basic Metabolic Panel:  Recent Labs Lab 03/30/15 1616 03/31/15 0200 03/31/15 0609 03/31/15 1720 04/01/15 0336 04/02/15 0428 04/03/15 0356  NA 145  --  143  --  140 141 140  K 4.2  --  3.6  --  3.2* 3.5 5.4*  CL 108  --  106  --  104 102 105  CO2 30  --  32  --  GLUCOSE 140*  --  114*  --  174* 100* 93  BUN 21*  --  21*  --  21* 19 26*  CREATININE 0.99  --  0.89  --  0.88 0.82 1.07*  CALCIUM 8.9  --  8.7*  --  8.4* 8.6* 8.7*  MG  --  1.7  --  1.7  --  1.8  --   PHOS  --  3.2  --   --   --   --   --    Liver Function Tests:  Recent  Labs Lab 03/31/15 0609  AST 17  ALT 17  ALKPHOS 52  BILITOT 0.9  PROT 6.0*  ALBUMIN 2.3*   No results for input(s): LIPASE, AMYLASE in the last 168 hours. No results for input(s): AMMONIA in the last 168 hours. CBC:  Recent Labs Lab 03/30/15 1616 03/31/15 0609 04/01/15 0336  WBC 11.1* 10.8* 9.4  NEUTROABS 8.6* 8.0*  --   HGB 9.0* 8.4* 8.0*  HCT 31.3* 28.1* 27.0*  MCV 87.7 86.7 87.4  PLT 216 201 178   Cardiac Enzymes:  Recent Labs Lab 03/31/15 0210 03/31/15 0609 03/31/15 1155  TROPONINI 0.18* 0.26* 0.25*   BNP (last 3 results)  Recent Labs  02/22/15 1020 03/30/15 1615 03/31/15 0435  BNP 2229.9* 1747.2* 2002.7*    ProBNP (last 3 results) No results for input(s): PROBNP in the last 8760 hours.  CBG: No results for input(s): GLUCAP in the last 168 hours.  Recent Results (from the past 240 hour(s))  Culture, Urine     Status: None   Collection Time: 03/31/15 12:14 AM  Result Value Ref  Range Status   Specimen Description URINE, CATHETERIZED  Final   Special Requests ADDED [id number]  Final   Culture 20,000 COLONIES/mL YEAST  Final   Report Status 04/01/2015 FINAL  Final  MRSA PCR Screening     Status: None   Collection Time: 03/31/15 12:20 AM  Result Value Ref Range Status   MRSA by PCR NEGATIVE NEGATIVE Final    Comment:        The GeneXpert MRSA Assay (FDA approved for NASAL specimens only), is one component of a comprehensive MRSA colonization surveillance program. It is not intended to diagnose MRSA infection nor to guide or monitor treatment for MRSA infections.   Culture, blood (Routine X 2) w Reflex to ID Panel     Status: None (Preliminary result)   Collection Time: 03/31/15  1:47 AM  Result Value Ref Range Status   Specimen Description BLOOD RIGHT HAND  Final   Special Requests IN PEDIATRIC BOTTLE  Final   Culture NO GROWTH 2 DAYS  Final   Report Status PENDING  Incomplete  Culture, blood (Routine X 2) w Reflex to ID Panel      Status: None (Preliminary result)   Collection Time: 03/31/15  8:30 AM  Result Value Ref Range Status   Specimen Description BLOOD LEFT ANTECUBITAL  Final   Special Requests BOTTLES DRAWN AEROBIC ONLY 5CC  Final   Culture NO GROWTH 2 DAYS  Final   Report Status PENDING  Incomplete     Studies: No results found.  Scheduled Meds: . aspirin EC  81 mg Oral Daily  . atorvastatin  20 mg Oral Daily  . ceFEPime (MAXIPIME) IV  1 g Intravenous Q24H  . fluconazole  100 mg Oral Daily  . furosemide  80 mg Intravenous BID  . isosorbide dinitrate  10 mg Oral TID  . magnesium oxide  400 mg Oral Daily  . rivaroxaban  20 mg Oral Q supper  . sodium chloride  3 mL Intravenous Q12H  . vancomycin  750 mg Intravenous Q12H   Continuous Infusions:   Principal Problem:   Acute diastolic heart failure (HCC) Active Problems:   HYPERTENSION, BENIGN SYSTEMIC   Chronic kidney disease (CKD), stage III (moderate)   Coronary artery disease   Anemia   Elevated troponin   Arm swelling   Chronic anticoagulation   Acute on chronic combined systolic (congestive) and diastolic (congestive) heart failure (HCC)   HCAP (healthcare-associated pneumonia)   DVT (deep venous thrombosis) (HCC)    Time spent: 35 minutes     [facility] Pager [phone]. If 7PM-7AM, please contact night-coverage at [email or web address], password [email or web address] 04/03/2015, 10:04 AM  LOS: 4 days   Karina Bouchard PA-S wrote parts of this note  Clint Lipps, MD [facility] Pager: [phone] 04/03/2015, 10:04 AM

## 2015-04-03 NOTE — Progress Notes (Signed)
Peripherally Inserted Central Catheter/Midline Placement  The IV Nurse has discussed with the patient and/or persons authorized to consent for the patient, the purpose of this procedure and the potential benefits and risks involved with this procedure.  The benefits include less needle sticks, lab draws from the catheter and patient may be discharged home with the catheter.  Risks include, but not limited to, infection, bleeding, blood clot (thrombus formation), and puncture of an artery; nerve damage and irregular heat beat.  Alternatives to this procedure were also discussed.  PICC/Midline Placement Documentation  PICC Single Lumen 04/03/15 PICC Left Brachial 41 cm 1 cm (Active)  Indication for Insertion or Continuance of Line Poor Vasculature-patient has had multiple peripheral attempts or PIVs lasting less than 24 hours 04/03/2015 11:00 AM  Exposed Catheter (cm) 1 cm 04/03/2015 11:00 AM  Dressing Change Due 04/10/15 04/03/2015 11:00 AM       Stacie Glaze Horton 04/03/2015, 11:48 AM

## 2015-04-03 NOTE — Progress Notes (Signed)
Pt BP 89/61, pt asymptomatic. K Shorr notified and ordered to hold Isordil tonight. Will continue to monitor. Huel Coventry, RN

## 2015-04-03 NOTE — Progress Notes (Signed)
Patient's systolic blood pressure is in the 60's. Patient still appears sleepy but is arousable via voice or touch. PA notified. Order given to give normal saline via 250cc bag at 75 ml/hr. Order given to hold lasix dose due now. Will recheck BP and continue to monitor.

## 2015-04-03 NOTE — Progress Notes (Signed)
Nutrition Brief Note  Patient identified due to a low Braden score.   Wt Readings from Last 15 Encounters:  04/03/15 186 lb 15.2 oz (84.8 kg)  02/22/15 182 lb 12.2 oz (82.9 kg)  10/31/14 134 lb (60.782 kg)  10/25/14 134 lb (60.782 kg)  09/26/14 132 lb 7.9 oz (60.1 kg)  09/23/14 137 lb (62.143 kg)  09/21/14 137 lb (62.143 kg)  09/14/14 134 lb (60.782 kg)  08/16/14 121 lb 3.7 oz (54.991 kg)  08/02/13 158 lb (71.668 kg)  11/18/12 158 lb (71.668 kg)  05/07/11 162 lb 9.6 oz (73.755 kg)  02/04/11 171 lb 6.4 oz (77.747 kg)  10/02/10 168 lb 3.2 oz (76.295 kg)  08/21/10 172 lb 2 oz (78.075 kg)    Body mass index is 37.74 kg/(m^2). Patient meets criteria for Obesity based on current BMI. Attempted to visit pt x 2 attempts; nursing care and sterile procedure in progress. RD familiar with pt from June 2016 admission at which time pt reported losing from 140 lbs to 122 lbs due to one week of N/V. Pt's weight has since trended back up.   Current diet order is 2 Gram Sodium, patient is consuming approximately 45-85% of most meals at this time. Labs and medications reviewed.   No nutrition interventions warranted at this time. If nutrition issues arise, please consult RD.   Dorothea Ogle RD, LDN Inpatient Clinical Dietitian Pager: [phone] After Hours Pager: [phone]

## 2015-04-03 NOTE — Progress Notes (Addendum)
Patient Name: Karina Willis Date of Encounter: 04/03/2015  Primary Cardiologist: Dr. Sw[place]   Principal Problem:   Acute diastolic heart failure (HCC) Active Problems:   HYPERTENSION, BENIGN SYSTEMIC   Chronic kidney disease (CKD), stage III (moderate)   Coronary artery disease   Anemia   Elevated troponin   Arm swelling   Chronic anticoagulation   Acute on chronic combined systolic (congestive) and diastolic (congestive) heart failure (HCC)   HCAP (healthcare-associated pneumonia)   DVT (deep venous thrombosis) (HCC)    SUBJECTIVE  Not feeling well this morning. Somewhat confused.   CURRENT MEDS . aspirin EC  81 mg Oral Daily  . atorvastatin  20 mg Oral Daily  . ceFEPime (MAXIPIME) IV  1 g Intravenous Q24H  . fluconazole  100 mg Oral Daily  . furosemide  40 mg Intravenous BID  . isosorbide dinitrate  10 mg Oral TID  . magnesium oxide  400 mg Oral Daily  . rivaroxaban  20 mg Oral Q supper  . sodium chloride  3 mL Intravenous Q12H  . vancomycin  750 mg Intravenous Q12H    OBJECTIVE  Filed Vitals:   04/02/15 1948 04/02/15 2352 04/03/15 0443 04/03/15 1201  BP: 90/48 98/62 103/61 92/56  Pulse: 100 98 102 95  Temp: 97.7 F (36.5 C)  97.9 F (36.6 C) 98.2 F (36.8 C)  TempSrc: Oral  Oral Oral  Resp: Height:      Weight:   186 lb 15.2 oz (84.8 kg)   SpO2: 100% 100% 98% 100%    Intake/Output Summary (Last 24 hours) at 04/03/15 1307 Last data filed at 04/03/15 0923  Gross per 24 hour  Intake   1010 ml  Output    450 ml  Net    560 ml   Filed Weights   04/01/15 0431 04/02/15 0325 04/03/15 0443  Weight: 186 lb 8.2 oz (84.6 kg) 186 lb 1.1 oz (84.4 kg) 186 lb 15.2 oz (84.8 kg)    PHYSICAL EXAM  General: Confused.  Neuro: Alert. Moves all extremities spontaneously. Psych: unable to assess HEENT:  Normal  Neck: Supple without bruits or JVD. Lungs:  Resp regular and unlabored. Anterior exam decreased breath sound in bilateral bases. Heart:  RRR no s3, s4, or murmurs. Abdomen: Soft, non-tender, non-distended, BS + x 4.  Extremities: No clubbing, cyanosis. DP/PT/Radials 2+ and equal bilaterally. Diffuse anasarca.   Accessory Clinical Findings  CBC  Recent Labs  04/01/15 0336  WBC 9.4  HGB 8.0*  HCT 27.0*  MCV 87.4  PLT 178   Basic Metabolic Panel  Recent Labs  03/31/15 1720  04/02/15 0428 04/03/15 0356  NA  --   < > 141 140  K  --   < > 3.5 5.4*  CL  --   < > 102 105  CO2  --   < > 30 24  GLUCOSE  --   < > 100* 93  BUN  --   < > 19 26*  CREATININE  --   < > 0.82 1.07*  CALCIUM  --   < > 8.6* 8.7*  MG 1.7  --  1.8  --   < > = values in this interval not displayed.  TELE NSR    ECG  NSR with HR 90s  Echocardiogram 03/31/2015  LV EF: 25% -  30%  ------------------------------------------------------------------- Indications:   CHF - 428.0.  ------------------------------------------------------------------- History:  PMH: Peripheral Arterial Disease. Peripheral Vascular Disease. Dyspnea.  Coronary artery disease. Coronary artery disease. Transient ischemic attack. Transient ischemic attack. Primary pulmonary hypertension. PMH:  Myocardial infarction. Myocardial infarction. Risk factors: Hypertension. Dyslipidemia.  ------------------------------------------------------------------- Study Conclusions  - Left ventricle: The cavity size was normal. Systolic function was severely reduced. The estimated ejection fraction was in the range of 25% to 30%. Diffuse hypokinesis. Features are consistent with a pseudonormal left ventricular filling pattern, with concomitant abnormal relaxation and increased filling pressure (grade 2 diastolic dysfunction). - Ventricular septum: The contour showed diastolic flattening. - Aortic valve: Valve area (Vmax): 0.9 cm^2. - Mitral valve: There was moderate regurgitation directed eccentrically and toward the septum. - Left atrium: The  atrium was moderately to severely dilated. - Right ventricle: The cavity size was moderately dilated. Systolic function was moderately reduced. - Right atrium: The atrium was mildly dilated. - Tricuspid valve: There was severe regurgitation. - Pulmonic valve: There was moderate regurgitation. - Pulmonary arteries: Systolic pressure was moderately increased. PA peak pressure: 62 mm Hg (S). - Pericardium, extracardiac: A trivial pericardial effusion was identified. There was a left pleural effusion.    Radiology/Studies  Dg Chest 2 View  03/30/2015  CLINICAL DATA:  Shortness of breath. Diffuse chest pain. Cough. Recently diagnosed pneumonia. EXAM: CHEST  2 VIEW COMPARISON:  02/22/2015. FINDINGS: Stable enlarged cardiac silhouette. Interval moderate-sized left pleural effusion. A moderate-sized left pleural effusion is again demonstrated. Interval increased density in the right lower lung zone and mildly increased density in the left lower lung zone. Thoracic spine and bilateral shoulder degenerative changes. IMPRESSION: 1. Interval moderate-sized right pleural effusion without significant change in a moderate-sized left pleural effusion. 2. Interval right basilar atelectasis or pneumonia. 3. Mildly increased left basilar atelectasis or pneumonia. 4. Stable cardiomegaly. Electronically Signed   By: Beckie Salts M.D.   On: 03/30/2015 16:39   Dg Chest Port 1 View  04/03/2015  CLINICAL DATA:  Central catheter placement EXAM: PORTABLE CHEST 1 VIEW COMPARISON:  March 30, 2015 FINDINGS: Central catheter tip is in the superior vena cava. No pneumothorax. There remain bilateral pleural effusions with bilateral lower lobe region consolidation, stable. Heart is enlarged with pulmonary vascularity within normal limits. No adenopathy apparent. No bone lesions. IMPRESSION: Central catheter tip in superior vena cava. Findings consistent with congestive heart failure. Question alveolar edema versus  superimposed pneumonia in both lower lobe regions. Both congestive heart failure and pneumonia may exist concurrently. The appearance of the lungs and cardiac silhouette is essentially stable compared to recent prior study. Electronically Signed   By: Bretta Bang III M.D.   On: 04/03/2015 12:32    ASSESSMENT AND PLAN  82 y/o female with h/o CAD, ischemic cardiomyopathy, chronic systolic CHF, moderate to severe MR, HTN, HL and prior TIA admitted for dyspnea in the setting of HCAP and acute on chronic CHF. Also with mildly elevated troponin.   1. Systolic CHF, possibly acute: h/o low EF in the past but with improvement in EF at time of last echo 08/2014, back to normal at 55-60%.   - Echo 03/31/2015 EF 25-30%, diffuse hypokinesis, grade 2 diastolic dysfunction, moderate MR, severe TR, moderate PR, PA peak pressure  - still appear to be fluid overloaded on exam, partially contributed by hypoalbuminemia, however her renal function worsening. Her clinical condition including BP is worsening as well. Ideally need RHC +/- LHC, however per Dr. [place], review of previous cath film pt is deemed not a candidate for PCI. Consider evaluation by HF service.   2. Abnormal Troponin: initial troponin  is 0.25. This is likely due to her new systolic heart failure, and unlikely due to coronary disease as she has not complained of chest pain. She does have known CAD, however it has been documented by Dr. Swaziland that she is not a candidate for revascularization based on old cath data.   3. CAD: per above, not a candidate for PCI based on previous cath findings. She is CP free. Continue medical therapy.   4. Mitral regurgitation: Appears to be functional and due to her cardiomyopathy. This should improve as she undergoes diuresis.  5. Nonsustained VT: The patient appears to be asymptomatic from her nonsustained VT. This should improve as she undergoes further diuresis.  6. Severe anemia: hgb 8.0 today,  review of record shows her hgb has been between 8-9 range.   - per consult note on 12/14, unclear reason for Xarelto, she has no h/o PAF, DVT/PE. She lives at [facility] and they are unable to explain why she is on Xarelto. Will discuss with MD regarding stop Xarelto, use subcu heparin or SCD. Right now, her DVT risk is high given her condition, but so is her bleeding risk, with continuous decline in her clinical condition, she does need DVT prophylaxis, however unclear benefit of Xarelto.    Ramond Dial PA-C Pager: [phone]   Personally seen and examined. Agree with above.  She looks quite ill, laying in bed. EKG no significant change from prior. PICC line in place  We will increase Lasix to 80 mg IV twice a day. Consider addition of metolazone in future.  With her multiple comorbidities, newly reduced ejection fraction, pneumonia, chronic oxygen use at home, mildly elevated troponin flat, demand ischemia she is at increased mortality risk.  Consider palliative care consultation for potential end-of-life discussion.    I'm fine with stopping Xarelto. Anemia has worsened.   Donato Schultz, MD

## 2015-04-03 NOTE — Progress Notes (Addendum)
Paged by nurse, patient's BP dropped to 69/48, held IV lasix, given IVF running at 96ml/hr. Will continue to monitor closely.    Ramond Dial PA Pager: [phone]  Personally seen and examined. Agree with above. Spoke to her and her friend. Concern expressed. Holding lasix. Gentle fluids.   Albumin 2.   Full code currently.  Donato Schultz, MD

## 2015-04-04 DIAGNOSIS — J189 Pneumonia, unspecified organism: Secondary | ICD-10-CM

## 2015-04-04 DIAGNOSIS — M7989 Other specified soft tissue disorders: Secondary | ICD-10-CM

## 2015-04-04 LAB — RENAL FUNCTION PANEL
ANION GAP: 10 (ref 5–15)
Albumin: 2.3 g/dL — ABNORMAL LOW (ref 3.5–5.0)
BUN: 27 mg/dL — AB (ref 6–20)
CHLORIDE: 98 mmol/L — AB (ref 101–111)
CO2: 32 mmol/L (ref 22–32)
Calcium: 9 mg/dL (ref 8.9–10.3)
Creatinine, Ser: 1.06 mg/dL — ABNORMAL HIGH (ref 0.44–1.00)
GFR calc non Af Amer: 48 mL/min — ABNORMAL LOW (ref >60)
GFR, EST AFRICAN AMERICAN: 56 mL/min — AB (ref >60)
Glucose, Bld: 108 mg/dL — ABNORMAL HIGH (ref 65–99)
POTASSIUM: 4.3 mmol/L (ref 3.5–5.1)
Phosphorus: 3.8 mg/dL (ref 2.5–4.6)
Sodium: 140 mmol/L (ref 135–145)

## 2015-04-04 MED ORDER — TRAMADOL HCL 50 MG PO TABS
100.0000 mg | ORAL_TABLET | Freq: Once | ORAL | Status: AC
  Administered 2015-04-04: 100 mg via ORAL
  Filled 2015-04-04: qty 2

## 2015-04-04 MED ORDER — LEVOFLOXACIN 500 MG PO TABS
500.0000 mg | ORAL_TABLET | Freq: Every day | ORAL | Status: DC
  Administered 2015-04-04 – 2015-04-05 (×2): 500 mg via ORAL
  Filled 2015-04-04 (×2): qty 1

## 2015-04-04 MED ORDER — FUROSEMIDE 10 MG/ML IJ SOLN
40.0000 mg | Freq: Two times a day (BID) | INTRAMUSCULAR | Status: DC
  Administered 2015-04-04 – 2015-04-06 (×5): 40 mg via INTRAVENOUS
  Filled 2015-04-04 (×6): qty 4

## 2015-04-04 NOTE — Progress Notes (Signed)
Patient Name: Karina Willis Date of Encounter: 04/04/2015  Primary Cardiologist: Dr. Sw[place]iland   Principal Problem:   Acute diastolic heart failure (HCC) Active Problems:   HYPERTENSION, BENIGN SYSTEMIC   Chronic kidney disease (CKD), stage III (moderate)   Coronary artery disease   Anemia   Elevated troponin   Arm swelling   Chronic anticoagulation   Acute on chronic combined systolic (congestive) and diastolic (congestive) heart failure (HCC)   HCAP (healthcare-associated pneumonia)   DVT (deep venous thrombosis) (HCC)    SUBJECTIVE  Currently in bed. Sleeping. Friend in room.   CURRENT MEDS . aspirin EC  81 mg Oral Daily  . atorvastatin  20 mg Oral Daily  . fluconazole  100 mg Oral Daily  . furosemide  40 mg Intravenous Once  . levofloxacin  500 mg Oral Daily  . magnesium oxide  400 mg Oral Daily  . sodium chloride  3 mL Intravenous Q12H    OBJECTIVE  Filed Vitals:   04/04/15 0529 04/04/15 0621 04/04/15 0939 04/04/15 1139  BP:  115/69 93/67 107/63  Pulse:  99 78 99  Temp: 98 F (36.7 C)   98 F (36.7 C)  TempSrc:    Oral  Resp:  20  18  Height:      Weight: 191 lb 9.3 oz (86.9 kg)     SpO2:  100%  98%    Intake/Output Summary (Last 24 hours) at 04/04/15 1433 Last data filed at 04/04/15 1259  Gross per 24 hour  Intake    680 ml  Output    550 ml  Net    130 ml   Filed Weights   04/02/15 0325 04/03/15 0443 04/04/15 0529  Weight: 186 lb 1.1 oz (84.4 kg) 186 lb 15.2 oz (84.8 kg) 191 lb 9.3 oz (86.9 kg)    PHYSICAL EXAM  General: Asleep Neuro: Alert. Moves all extremities spontaneously. Psych: unable to assess HEENT:  Normal  Neck: Supple without bruits or JVD. Lungs:  Resp regular and unlabored. Anterior exam decreased breath sound in bilateral bases. Heart: RRR no s3, s4, or murmurs. Abdomen: Soft, non-tender, non-distended, BS + x 4.  Extremities: No clubbing, cyanosis. DP/PT/Radials 2+ and equal bilaterally. Diffuse anasarca.   Accessory  Clinical Findings  CBC No results for input(s): WBC, NEUTROABS, HGB, HCT, MCV, PLT in the last 72 hours. Basic Metabolic Panel  Recent Labs  04/02/15 0428 04/03/15 0356 04/04/15 0425  NA 141 140 140  K 3.5 5.4* 4.3  CL 102 105 98*  CO2 30 24 32  GLUCOSE 100* 93 108*  BUN 19 26* 27*  CREATININE 0.82 1.07* 1.06*  CALCIUM 8.6* 8.7* 9.0  MG 1.8  --   --   PHOS  --   --  3.8    TELE NSR    ECG  NSR with HR 90s  Echocardiogram 03/31/2015  LV EF: 25% -  30%  ------------------------------------------------------------------- Indications:   CHF - 428.0.  ------------------------------------------------------------------- History:  PMH: Peripheral Arterial Disease. Peripheral Vascular Disease. Dyspnea. Coronary artery disease. Coronary artery disease. Transient ischemic attack. Transient ischemic attack. Primary pulmonary hypertension. PMH:  Myocardial infarction. Myocardial infarction. Risk factors: Hypertension. Dyslipidemia.  ------------------------------------------------------------------- Study Conclusions  - Left ventricle: The cavity size was normal. Systolic function was severely reduced. The estimated ejection fraction was in the range of 25% to 30%. Diffuse hypokinesis. Features are consistent with a pseudonormal left ventricular filling pattern, with concomitant abnormal relaxation and increased filling pressure (grade 2 diastolic dysfunction). -  Ventricular septum: The contour showed diastolic flattening. - Aortic valve: Valve area (Vmax): 0.9 cm^2. - Mitral valve: There was moderate regurgitation directed eccentrically and toward the septum. - Left atrium: The atrium was moderately to severely dilated. - Right ventricle: The cavity size was moderately dilated. Systolic function was moderately reduced. - Right atrium: The atrium was mildly dilated. - Tricuspid valve: There was severe regurgitation. - Pulmonic valve:  There was moderate regurgitation. - Pulmonary arteries: Systolic pressure was moderately increased. PA peak pressure: 62 mm Hg (S). - Pericardium, extracardiac: A trivial pericardial effusion was identified. There was a left pleural effusion.    Radiology/Studies  Dg Chest 2 View  03/30/2015  CLINICAL DATA:  Shortness of breath. Diffuse chest pain. Cough. Recently diagnosed pneumonia. EXAM: CHEST  2 VIEW COMPARISON:  02/22/2015. FINDINGS: Stable enlarged cardiac silhouette. Interval moderate-sized left pleural effusion. A moderate-sized left pleural effusion is again demonstrated. Interval increased density in the right lower lung zone and mildly increased density in the left lower lung zone. Thoracic spine and bilateral shoulder degenerative changes. IMPRESSION: 1. Interval moderate-sized right pleural effusion without significant change in a moderate-sized left pleural effusion. 2. Interval right basilar atelectasis or pneumonia. 3. Mildly increased left basilar atelectasis or pneumonia. 4. Stable cardiomegaly. Electronically Signed   By: Beckie Salts M.D.   On: 03/30/2015 16:39   Dg Chest Port 1 View  04/03/2015  CLINICAL DATA:  Central catheter placement EXAM: PORTABLE CHEST 1 VIEW COMPARISON:  March 30, 2015 FINDINGS: Central catheter tip is in the superior vena cava. No pneumothorax. There remain bilateral pleural effusions with bilateral lower lobe region consolidation, stable. Heart is enlarged with pulmonary vascularity within normal limits. No adenopathy apparent. No bone lesions. IMPRESSION: Central catheter tip in superior vena cava. Findings consistent with congestive heart failure. Question alveolar edema versus superimposed pneumonia in both lower lobe regions. Both congestive heart failure and pneumonia may exist concurrently. The appearance of the lungs and cardiac silhouette is essentially stable compared to recent prior study. Electronically Signed   By: Bretta Bang III  M.D.   On: 04/03/2015 12:32    ASSESSMENT AND PLAN  82 year old with ischemic cardiomyopathy ejection fraction 20-25%, chronic systolic heart failure with moderate to severe mitral regurgitation, hypertension, hyperlipidemia, prior TIA, mildly elevated troponin.  Systolic heart failure  - Challenging situation  - Deconditioning noted  - Decreased IV Lasix to 40 mg twice a day  - Episode of severe hypotension yesterday with systolics in the upper 60s. Needed fluid yesterday.  - Meager output over the past 24 hours.  - Weight has increased   Deconditioning  - She is asleep  - Appears exhausted  - In review of office note from 03/28/15 she had a continue progressively disability rating state total lift from bed to chair with minimal ability to see even stand to transfer.  Healthcare associated pneumonia  - She has completed antibiotic therapy. Cultures are negative  Coronary artery disease  - No anginal symptoms  - Prior anatomy reviewed  PVD  - Left femoropopliteal bypass  - Recently patent  Anemia  - Last hemoglobin 8.0  - Consider repeating  Hypoalbuminemia  - Albumin 2.3  - Poor protein intake  Right arm swelling  - Off Xarelto, anemia had worsened  Consider goals of care, palliative care consultation.

## 2015-04-04 NOTE — Progress Notes (Signed)
[facility] PROGRESS NOTE  Karina Willis ZOX:[id number] DOB: 1932/09/11 DOA: 03/30/2015 PCP: Peter [place], MD    HPI/Subjective: She developed hypotension yesterday, diuretics discontinued and started on IV fluids. Hold antihypertensive because of the low blood pressure. Patient probably has massive third spacing. If not improving might need goals of care/palliative.   Assessment/Plan: Principal Problem:   Acute diastolic heart failure (HCC) Active Problems:   HYPERTENSION, BENIGN SYSTEMIC   Chronic kidney disease (CKD), stage III (moderate)   Coronary artery disease   Anemia   Elevated troponin   Arm swelling   Chronic anticoagulation   Acute on chronic combined systolic (congestive) and diastolic (congestive) heart failure (HCC)   HCAP (healthcare-associated pneumonia)   DVT (deep venous thrombosis) (HCC)   Acute exacerbation of chronic diastolic & systolic heart failure  Presented with SOB, lower extremity edema and BNP of 2000, also recently diagnosed with pneumonia. Weight only down <2lbs since admission. Follow daily weight, strict I&Os. Monitor renal function and electrolytes daily Echo shows EF reduced from previous 08/2014 at 55-60% now down to 25-30% Not on ACE/ARBi due to history of chronic kidney disease and improved EF, continue Isordil Appreciate cardiology recommendations Lasix decreased from 80 twice a day to 40 twice a day,  continue to monitor urine output and weight. Reinsert Foley catheter for closer monitoring of urine output, has had only 4 50 mL for the past 24 hours.  Third spacing and decrease effective circulation, getting IV fluids for now. Still fluids when blood pressure normalized.   HCAP (healthcare-associated pneumonia) Started on Levaquin as outpatient. Continue Vancomycin and Cefepime Cultures are negative for now. Discontinue vancomycin and cefepime .  Chronic respiratory failure Patient is on 2-3 L of oxygen at home. Currently  oxygenating well on 3L.   Chronic kidney disease (CKD), stage III (moderate)  Up to 1.07 after Lasix increased, will continue to monitor renal function daily with diuresis.   Coronary artery disease with HTN and HLD Elevated troponin, flat curve doubt ACS,  likely secondary to demand ischemia d/t acute CHF and HCAP Patient denies chest pain Continue ASA and statin  PVD s/p LLE fem-pop bypass- Evaluated 1/17 as outpatient and found to be patent. Continue ASA.  Anemia - baseline appears to be 8-9. Down 8.4 >>8.0 but no e/o active bleeding, monitor.  RUE swelling - likely 2/2 small hematoma from IV draw. RUE Doppler negative for DVT. Maintain arm elevation.  History of DVT (HCC) - Unclear when this occurred and if lifelong anticoagulation is indicated. D/c Xarelto if ok with Cardiology. The patient is bed bound, consider switch to prophylactic lovenox for this admission.  Deconditioning: PT to eval and treat  Hypokalemia, resolved, now hyperkalemic Likely secondary to diuresis, repleted, now up to 5.4 - will hold K supplements and check BMP tomorrow  ? UTI Urinalysis showed rare bacteria and yeast on admission, she is on antibiotics already. Continue Diflucan   Code Status: FULL Family Communication: None at bedside  Disposition Plan: Likely will return to SNF when stable for discharge, remains inpatient for further IV diuresis and IV antibiotics.   Consultants:  Cardiology  Procedures:  ECHO 03/31/2015 Study Conclusions - Left ventricle: The cavity size was normal. Systolic function was severely reduced. The estimated ejection fraction was in therange of 25% to 30%. Diffuse hypokinesis. Features are consistentwith a pseudonormal left ventricular filling pattern, with concomitant abnormal relaxation and increased filling pressure (grade 2 diastolic dysfunction). - Ventricular septum: The contour showed diastolic flattening. - Aortic valve:  Valve area (Vmax): 0.9 cm^2. -  Mitral valve: There was moderate regurgitation directedeccentrically and toward the septum. - Left atrium: The atrium was moderately to severely dilated. - Right ventricle: The cavity size was moderately dilated. Systolicfunction was moderately reduced. - Right atrium: The atrium was mildly dilated. - Tricuspid valve: There was severe regurgitation. - Pulmonic valve: There was moderate regurgitation. - Pulmonary arteries: Systolic pressure was moderately increased.PA peak pressure: 62 mm Hg (S). - Pericardium, extracardiac: A trivial pericardial effusion wasidentified. There was a left pleural effusion.  Antimicrobials:  Vancomycin started 1/19  Cefepime started 1/19  Diflucan started 1/20    Objective: Filed Vitals:   04/04/15 0621 04/04/15 0939  BP: 115/69 93/67  Pulse: 99 78  Temp:    Resp: 20     Intake/Output Summary (Last 24 hours) at 04/04/15 1128 Last data filed at 04/04/15 0449  Gross per 24 hour  Intake   1340 ml  Output    350 ml  Net    990 ml   Filed Weights   04/02/15 0325 04/03/15 0443 04/04/15 0529  Weight: 84.4 kg (186 lb 1.1 oz) 84.8 kg (186 lb 15.2 oz) 86.9 kg (191 lb 9.3 oz)    Exam:   General:  Alert and oriented, NAD  Cardiovascular: RRR, 2-3+ BLE edema  Respiratory: tachypnea, conversational dyspnea, diminished breath sounds bilaterally  Abdomen: obese, soft, nontender, nondistended  Musculoskeletal: RUE edema with ecchymosis over anterior forearm  Psych: cooperative, speech clear  Skin: Back examined - no obvious sacral ulcer   Data Reviewed: Basic Metabolic Panel:  Recent Labs Lab 03/31/15 0200 03/31/15 0609 03/31/15 1720 04/01/15 0336 04/02/15 0428 04/03/15 0356 04/04/15 0425  NA  --  143  --  140 141 140 140  K  --  3.6  --  3.2* 3.5 5.4* 4.3  CL  --  106  --  104 102 105 98*  CO2  --  32  --  31 30 24  32  GLUCOSE  --  114*  --  174* 100* 93 108*  BUN  --  21*  --  21* 19 26* 27*  CREATININE  --  0.89  --  0.88  0.82 1.07* 1.06*  CALCIUM  --  8.7*  --  8.4* 8.6* 8.7* 9.0  MG 1.7  --  1.7  --  1.8  --   --   PHOS 3.2  --   --   --   --   --  3.8   Liver Function Tests:  Recent Labs Lab 03/31/15 0609 04/04/15 0425  AST 17  --   ALT 17  --   ALKPHOS 52  --   BILITOT 0.9  --   PROT 6.0*  --   ALBUMIN 2.3* 2.3*   No results for input(s): LIPASE, AMYLASE in the last 168 hours. No results for input(s): AMMONIA in the last 168 hours. CBC:  Recent Labs Lab 03/30/15 1616 03/31/15 0609 04/01/15 0336  WBC 11.1* 10.8* 9.4  NEUTROABS 8.6* 8.0*  --   HGB 9.0* 8.4* 8.0*  HCT 31.3* 28.1* 27.0*  MCV 87.7 86.7 87.4  PLT 216 201 178   Cardiac Enzymes:  Recent Labs Lab 03/31/15 0210 03/31/15 0609 03/31/15 1155  TROPONINI 0.18* 0.26* 0.25*   BNP (last 3 results)  Recent Labs  02/22/15 1020 03/30/15 1615 03/31/15 0435  BNP 2229.9* 1747.2* 2002.7*    ProBNP (last 3 results) No results for input(s): PROBNP in the last 8760 hours.  CBG: No results for input(s): GLUCAP in the last 168 hours.  Recent Results (from the past 240 hour(s))  Culture, Urine     Status: None   Collection Time: 03/31/15 12:14 AM  Result Value Ref Range Status   Specimen Description URINE, CATHETERIZED  Final   Special Requests ADDED [id number]  Final   Culture 20,000 COLONIES/mL YEAST  Final   Report Status 04/01/2015 FINAL  Final  MRSA PCR Screening     Status: None   Collection Time: 03/31/15 12:20 AM  Result Value Ref Range Status   MRSA by PCR NEGATIVE NEGATIVE Final    Comment:        The GeneXpert MRSA Assay (FDA approved for NASAL specimens only), is one component of a comprehensive MRSA colonization surveillance program. It is not intended to diagnose MRSA infection nor to guide or monitor treatment for MRSA infections.   Culture, blood (Routine X 2) w Reflex to ID Panel     Status: None (Preliminary result)   Collection Time: 03/31/15  1:47 AM  Result Value Ref Range Status    Specimen Description BLOOD RIGHT HAND  Final   Special Requests IN PEDIATRIC BOTTLE  Final   Culture NO GROWTH 3 DAYS  Final   Report Status PENDING  Incomplete  Culture, blood (Routine X 2) w Reflex to ID Panel     Status: None (Preliminary result)   Collection Time: 03/31/15  8:30 AM  Result Value Ref Range Status   Specimen Description BLOOD LEFT ANTECUBITAL  Final   Special Requests BOTTLES DRAWN AEROBIC ONLY 5CC  Final   Culture NO GROWTH 3 DAYS  Final   Report Status PENDING  Incomplete     Studies: Dg Chest Port 1 View  04/03/2015  CLINICAL DATA:  Central catheter placement EXAM: PORTABLE CHEST 1 VIEW COMPARISON:  March 30, 2015 FINDINGS: Central catheter tip is in the superior vena cava. No pneumothorax. There remain bilateral pleural effusions with bilateral lower lobe region consolidation, stable. Heart is enlarged with pulmonary vascularity within normal limits. No adenopathy apparent. No bone lesions. IMPRESSION: Central catheter tip in superior vena cava. Findings consistent with congestive heart failure. Question alveolar edema versus superimposed pneumonia in both lower lobe regions. Both congestive heart failure and pneumonia may exist concurrently. The appearance of the lungs and cardiac silhouette is essentially stable compared to recent prior study. Electronically Signed   By: Bretta Bang III M.D.   On: 04/03/2015 12:32    Scheduled Meds: . aspirin EC  81 mg Oral Daily  . atorvastatin  20 mg Oral Daily  . ceFEPime (MAXIPIME) IV  1 g Intravenous Q24H  . fluconazole  100 mg Oral Daily  . furosemide  40 mg Intravenous Once  . isosorbide dinitrate  10 mg Oral TID  . magnesium oxide  400 mg Oral Daily  . sodium chloride  3 mL Intravenous Q12H  . vancomycin  750 mg Intravenous Q12H   Continuous Infusions:   Principal Problem:   Acute diastolic heart failure (HCC) Active Problems:   HYPERTENSION, BENIGN SYSTEMIC   Chronic kidney disease (CKD), stage III  (moderate)   Coronary artery disease   Anemia   Elevated troponin   Arm swelling   Chronic anticoagulation   Acute on chronic combined systolic (congestive) and diastolic (congestive) heart failure (HCC)   HCAP (healthcare-associated pneumonia)   DVT (deep venous thrombosis) (HCC)    Time spent: 35 minutes     [facility] Pager [phone].  If 7PM-7AM, please contact night-coverage at [email or web address], password [facility] 04/04/2015, 11:28 AM  LOS: 5 days     Clint Lipps, MD [facility] Pager: [phone] 04/04/2015, 11:28 AM

## 2015-04-04 NOTE — Progress Notes (Signed)
Pt BP still low, pt states she is sleepy and is drowsy, pt also calling out for pain medicine. K Schorr notified and ordered to give one time dose of tramadol instead of percocet.

## 2015-04-04 NOTE — Progress Notes (Signed)
Physical Therapy Treatment Patient Details Name: Karina Willis MRN: [id number] DOB: 1933/01/09 Today's Date: 04/04/2015    History of Present Illness 82 y/o female with h/o CAD, ischemic cardiomyopathy, chronic systolic CHF, moderate to severe MR, HTN, HL and prior TIA admitted for dyspnea in the setting of HCAP and acute on chronic CHF. Also with mildly elevated troponin.     PT Comments    Patient seen for trial of PT. Patient limited by pain and fatigue. Tolerated ~5 minutes EOB with some therapeutic activity but decline OOB. Patient with increased edema noted. Assisted with positioning and education for edema control. Will continue to see as indicated and progress as tolerated.   Follow Up Recommendations  SNF;Supervision/Assistance - 24 hour     Equipment Recommendations  Other (comment) (TBA)    Recommendations for Other Services       Precautions / Restrictions Precautions Precautions: Fall Restrictions Weight Bearing Restrictions: No    Mobility  Bed Mobility Overal bed mobility: Needs Assistance;+2 for physical assistance Bed Mobility: Rolling Rolling: Total assist;+2 for physical assistance         General bed mobility comments: +2 physical assist with chuck pad and helicopter technique  Transfers                    Ambulation/Gait                 Stairs            Wheelchair Mobility    Modified Rankin (Stroke Patients Only)       Balance Overall balance assessment: Needs assistance Sitting-balance support: Bilateral upper extremity supported Sitting balance-Leahy Scale: Poor Sitting balance - Comments: sat EOB >5 minutes with min to moderate assist. Able to initiate trunk movement but fatigues quickly and limited by pain Postural control: Posterior lean;Right lateral lean                          Cognition Arousal/Alertness: Awake/alert Behavior During Therapy: Flat affect Overall Cognitive Status: Within  Functional Limits for tasks assessed                      Exercises General Exercises - Lower Extremity Ankle Circles/Pumps: AROM;AAROM;Both;5 reps Long Arc Quad: PROM;Both;5 reps Heel Slides: PROM;Both;5 reps    General Comments General comments (skin integrity, edema, etc.): attempted to educate patient on edema control strategies including AROM and elevation. increased edema RUE in addition to bilatera LEs. elevated and positioned for optimal control in bed at end of session      Pertinent Vitals/Pain Pain Assessment: Faces Faces Pain Scale: Hurts whole lot Pain Location: Bilateral LEs and right UE Pain Descriptors / Indicators: Aching;Constant;Grimacing;Guarding;Sore Pain Intervention(s): Limited activity within patient's tolerance;Monitored during session;Repositioned    Home Living Family/patient expects to be discharged to:: Skilled nursing facility               Additional Comments: Has been at [facility] care getting therapy for a few months per pt.      Prior Function Level of Independence: Needs assistance  Gait / Transfers Assistance Needed: Pt states "2 strong girls pick her up and put her in chair" or they use a mechanical lift.  Pt does not propel the wheelchair. ADL's / Homemaking Assistance Needed: total assist for B/D - only washes face per pt report.   Comments: Used 3LO2 in NH.     PT Goals (current goals  can now be found in the care plan section) Acute Rehab PT Goals Patient Stated Goal: to try PT PT Goal Formulation: With patient Time For Goal Achievement: 04/15/15 Potential to Achieve Goals: Fair Progress towards PT goals: Progressing toward goals;Not progressing toward goals - comment (limited by pain)    Frequency  Min 2X/week    PT Plan Current plan remains appropriate    Co-evaluation             End of Session Equipment Utilized During Treatment: Oxygen (3 liters) Activity Tolerance: Patient limited by fatigue;Patient  limited by pain Patient left: in bed;with call bell/phone within reach;with bed alarm set     Time: 1610-9604 PT Time Calculation (min) (ACUTE ONLY): 17 min  Charges:  $Therapeutic Activity: 8-22 mins                    G CodesFabio Asa 03-May-2015, 2:38 PM  Charlotte Crumb, PT DPT  [phone]

## 2015-04-05 DIAGNOSIS — I519 Heart disease, unspecified: Secondary | ICD-10-CM

## 2015-04-05 LAB — RENAL FUNCTION PANEL
ALBUMIN: 2.3 g/dL — AB (ref 3.5–5.0)
ANION GAP: 14 (ref 5–15)
BUN: 25 mg/dL — ABNORMAL HIGH (ref 6–20)
CALCIUM: 8.5 mg/dL — AB (ref 8.9–10.3)
CO2: 30 mmol/L (ref 22–32)
Chloride: 95 mmol/L — ABNORMAL LOW (ref 101–111)
Creatinine, Ser: 1.09 mg/dL — ABNORMAL HIGH (ref 0.44–1.00)
GFR calc non Af Amer: 47 mL/min — ABNORMAL LOW (ref >60)
GFR, EST AFRICAN AMERICAN: 54 mL/min — AB (ref >60)
Glucose, Bld: 100 mg/dL — ABNORMAL HIGH (ref 65–99)
PHOSPHORUS: 3.4 mg/dL (ref 2.5–4.6)
Potassium: 4.3 mmol/L (ref 3.5–5.1)
SODIUM: 139 mmol/L (ref 135–145)

## 2015-04-05 LAB — CULTURE, BLOOD (ROUTINE X 2)
CULTURE: NO GROWTH
CULTURE: NO GROWTH

## 2015-04-05 MED ORDER — LEVOFLOXACIN 750 MG PO TABS
750.0000 mg | ORAL_TABLET | ORAL | Status: DC
  Administered 2015-04-07: 750 mg via ORAL
  Filled 2015-04-05: qty 1

## 2015-04-05 MED ORDER — LEVOFLOXACIN 500 MG PO TABS
250.0000 mg | ORAL_TABLET | Freq: Once | ORAL | Status: AC
  Administered 2015-04-05: 250 mg via ORAL
  Filled 2015-04-05 (×2): qty 1

## 2015-04-05 NOTE — Care Management Note (Signed)
Case Management Note  Patient Details  Name: ALPHONSINE MINIUM MRN: [id number] Date of Birth: May 13, 1932  Subjective/Objective:  82 y.o. F admitted 03/30/2015 for HCAP and Acute Diastolic HF. Pt has been on diuretics and antibiotics with little to no progress. Weight is only down 2 lbs since admission despite diuresis. Palliative conversations at present due to possibility of third spacing.                   Action/Plan: Anticipate discharge to SNF with Palliative services when ready. CSW following. . No further CM needs but will be available should additional discharge needs arise.   Expected Discharge Date:                  Expected Discharge Plan:  Skilled Nursing Facility (SNF with Palliative Care)  In-House Referral:  Clinical Social Work  Discharge planning Services  CM Consult  Post Acute Care Choice:    Choice offered to:     DME Arranged:    DME Agency:     HH Arranged:    HH Agency:     Status of Service:  Completed, signed off  Medicare Important Message Given:  Yes Date Medicare IM Given:    Medicare IM give by:    Date Additional Medicare IM Given:    Additional Medicare Important Message give by:     If discussed at Long Length of Stay Meetings, dates discussed:    Additional Comments:  Yvone Neu, RN 04/05/2015, 3:15 PM

## 2015-04-05 NOTE — Consult Note (Signed)
   [facility] Inpatient Consult   04/05/2015  Karina Willis 07/04/1932 [id number] Patient has been screened for [facility] Care Management. 82 year old with ischemic cardiomyopathy ejection fraction 20-25%, chronic systolic heart failure with moderate to severe mitral regurgitation, hypertension, hyperlipidemia, prior TIA, mildly elevated troponin per EPIC chart review. Patient is currently for SNF at discharge. No current community care management needs identified at this time. Will follow for progression.  For questions, please contact: Charlesetta Shanks, RN BSN CCM [facility]  [phone] business mobile phone Toll free office [phone]

## 2015-04-05 NOTE — Clinical Social Work Note (Signed)
Clinical Social Work Assessment  Patient Details  Name: Karina Willis MRN: [id number] Date of Birth: Apr 02, 1932  Date of referral:  04/05/15               Reason for consult:  Facility Placement (Return to [facility])                Permission sought to share information with:  Family Supports, Chartered certified accountant granted to share information::  Yes, Verbal Permission Granted  Name::     [name], Neice-  Architect            Housing/Transportation Living arrangements for the past 2 months:  [place] of Information:  Patient, Other (Comment Required) Passenger transport manager) Patient Interpreter Needed:  None Criminal Activity/Legal Involvement Pertinent to Current Situation/Hospitalization:  No - Comment as needed Significant Relationships:  Other Family Members, Friend (Neice) Lives with:  Facility Resident Do you feel safe going back to the place where you live?  Yes Need for family participation in patient care:  Yes (Comment)  Care giving concerns:  "I was feeling bad but now I'm good- I am ready to go!" per patient. No concerns expressed by Niece.  Social Worker assessment / plan:  CSW met with this11 year old female- resident of [facility] and her niece Karina Willis this afternoon at bedside.  Plan is for return to [facility] when medically stable- possibly with Palliative Care services.  Patient noted to alert and oriented to person on but is very pleasant and verbal.  Niece stated that she was fine with the care patient receives at [facility] and does not wish to change facilities.  She is also agreeable to Palliative Care services at the facility. CSW spoke with Karina Willis- Admissions Liaison at [facility]. She states that they will accept patient back when medically stable.  Fl2 initiated and has been placed on chart for MD's signature. Employment status:  Retired Nurse, adult PT  Recommendations:  [place] / Referral to community resources:  [place]  Patient/Family's Response to care:  Both patient expressed that they are pleased with current care and patient indicated that she is anxious to leave the hospital as soon as possible.  Patient/Family's Understanding of and Emotional Response to Diagnosis, Current Treatment, and Prognosis:  Patient is unable to verbalized a true understanding of her diagnosis, treatment or prognosis due to her confusion.  Niece noted to be calm and stated a fairly good understanding of patient's condition and ongoing needs.    Emotional Assessment Appearance:  Appears stated age Attitude/Demeanor/Rapport:   (Appropriate for age and situation) Affect (typically observed):  Pleasant, Quiet, Calm Orientation:  Oriented to Self Alcohol / Substance use:    Psych involvement (Current and /or in the community):  No (Comment)  Discharge Needs  Concerns to be addressed:  Care Coordination Readmission within the last 30 days:  No Current discharge risk:  Cognitively Impaired, Dependent with Mobility Barriers to Discharge:  Continued Medical Work up   Karina Willis T, LCSW 04/05/2015, 3:21 PM

## 2015-04-05 NOTE — Progress Notes (Signed)
Patient: Karina Willis / Admit Date: 03/30/2015 / Date of Encounter: 04/05/2015, 11:37 AM   Subjective: Generally feels poorly. Reports headache. Swelling still present. Appears very tired.   Objective: Telemetry: NSR, occ PVCs Physical Exam: Blood pressure 86/68, pulse 92, temperature 98.2 F (36.8 C), temperature source Oral, resp. rate 18, height  (1.499 m), weight 190 lb 4.8 oz (86.32 kg), SpO2 100 %. General: Chronically ill appearing AAF in NAD, weak/fatigued appearing Head: Normocephalic, atraumatic, sclera non-icteric, no xanthomas, nares are without discharge. Neck: JVP moderately elevated. Lungs: Diminished anteriorly; no wheezes, rales, or rhonchi. Breathing is unlabored. Heart: RRR S1 S2 without murmurs, rubs, or gallops.  Abdomen: Soft, non-tender, non-distended with normoactive bowel sounds. No rebound/guarding. Extremities: No clubbing or cyanosis. Diffuse anasarca. Distal pedal pulses are 2+ and equal bilaterally. Neuro: Sleepy but arousable and able to be alert and oriented X 3. Moves all extremities spontaneously. Psych:  Responds to questions appropriately.   Intake/Output Summary (Last 24 hours) at 04/05/15 1137 Last data filed at 04/05/15 1043  Gross per 24 hour  Intake    360 ml  Output    700 ml  Net   -340 ml    Inpatient Medications:  . aspirin EC  81 mg Oral Daily  . atorvastatin  20 mg Oral Daily  . fluconazole  100 mg Oral Daily  . furosemide  40 mg Intravenous Once  . furosemide  40 mg Intravenous BID  . levofloxacin  250 mg Oral Once  . [START ON 04/07/2015] levofloxacin  750 mg Oral Q48H  . magnesium oxide  400 mg Oral Daily  . sodium chloride  3 mL Intravenous Q12H   Infusions:    Labs:  Recent Labs  04/04/15 0425 04/05/15 0443  NA 140 139  K 4.3 4.3  CL 98* 95*  CO2 32 30  GLUCOSE 108* 100*  BUN 27* 25*  CREATININE 1.06* 1.09*  CALCIUM 9.0 8.5*  PHOS 3.8 3.4    Recent Labs  04/04/15 0425 04/05/15 0443  ALBUMIN 2.3*  2.3*   No results for input(s): WBC, NEUTROABS, HGB, HCT, MCV, PLT in the last 72 hours. No results for input(s): CKTOTAL, CKMB, TROPONINI in the last 72 hours. Invalid input(s): POCBNP No results for input(s): HGBA1C in the last 72 hours.   Radiology/Studies:  Dg Chest 2 View  03/30/2015  CLINICAL DATA:  Shortness of breath. Diffuse chest pain. Cough. Recently diagnosed pneumonia. EXAM: CHEST  2 VIEW COMPARISON:  02/22/2015. FINDINGS: Stable enlarged cardiac silhouette. Interval moderate-sized left pleural effusion. A moderate-sized left pleural effusion is again demonstrated. Interval increased density in the right lower lung zone and mildly increased density in the left lower lung zone. Thoracic spine and bilateral shoulder degenerative changes. IMPRESSION: 1. Interval moderate-sized right pleural effusion without significant change in a moderate-sized left pleural effusion. 2. Interval right basilar atelectasis or pneumonia. 3. Mildly increased left basilar atelectasis or pneumonia. 4. Stable cardiomegaly. Electronically Signed   By: Beckie Salts M.D.   On: 03/30/2015 16:39   Dg Chest Port 1 View  04/03/2015  CLINICAL DATA:  Central catheter placement EXAM: PORTABLE CHEST 1 VIEW COMPARISON:  March 30, 2015 FINDINGS: Central catheter tip is in the superior vena cava. No pneumothorax. There remain bilateral pleural effusions with bilateral lower lobe region consolidation, stable. Heart is enlarged with pulmonary vascularity within normal limits. No adenopathy apparent. No bone lesions. IMPRESSION: Central catheter tip in superior vena cava. Findings consistent with congestive heart failure. Question alveolar edema  versus superimposed pneumonia in both lower lobe regions. Both congestive heart failure and pneumonia may exist concurrently. The appearance of the lungs and cardiac silhouette is essentially stable compared to recent prior study. Electronically Signed   By: Bretta Bang III M.D.   On:  04/03/2015 12:32     Assessment and Plan  39F CAD (totally occluded RCA, LCx 2001), chronic systolic CHF (EF reportedly as low as 25-30% previously, then 55-60% in 08/2014, then 25-30% this admission), chronic respiratory failure on home O2, mod-severe MR, HTN, HLD, prior TIAs, CKD stage III, LE PAD, ?prior h/o DVT (details unclear), pulm HTN who presented to [facility] with LEE, dyspnea, CHF, mildly elevated troponin, and possible HCAP. Per chart, she has been deemed not to be a candidate for any type of revascularization procedure per Dr. Swaziland in the past. Suspected component of CHF from hypoalbuminemia and high output from anemia. 2D Echo 03/31/15: EF 25-30%, grade 2 DD, diastolic flattening, mod MR, severe TR, mod PR, PASP 62, mod-severely dilated LA, RV moderately dilated with moderately reduced systolic function. Xarelto stopped due to anemia (has not been clear where this rx came from.)  LE/UE duplexes neg for DVT.  1. Acute on chronic combined CHF with right heart dysfunction and mod MR/sev TR, complicated by severe hypotension after diuresis - challenging situation, complicated by hypotension which limits medical therapy. Not making much headway with weights or I/O's. Will review further plans with MD. Not sure if she is someone we need to pursue inotropes on vs palliative care - per IM note, Dr. Benjamine Mola spoke with niece re: pall care. I agree this sounds quite appropriate.  2. HCAP - per IM.   3. CAD - medical management as noted above. Continuing aspirin, statin.  4. Anemia - per IM. No clear indication as to where prior rx for Xarelto came from although chart suggests a history of DVT. This has been stopped in light of worsening anemia (required admission/transfusion in 02/2015) and negative duplexes this admission. Patient is bedbound so certainly is at risk for VTE but at this point it appears risk outweighs benefit. Prophylactic dose seems appropriate to me, will ask MD to make final comments.  5.  CKD stage III - Cr generally stable.   Signed, Ronie Spies PA-C Pager: [phone]    Personally seen and examined. Agree with above. Agree with palliative care option She has been declining more and more recently Do not think she is a good candidate for advanced HF therapies.   Donato Schultz, MD

## 2015-04-05 NOTE — Progress Notes (Signed)
[facility] PROGRESS NOTE  Karina Willis ZOX:[id number] DOB: [birth year] DOA: 03/30/2015 PCP: Peter Swaziland, MD    HPI/Subjective: No complaints this AM.   Assessment/Plan: Active Problems:   HYPERTENSION, BENIGN SYSTEMIC   Chronic kidney disease (CKD), stage III (moderate)   Mitral insufficiency   Coronary artery disease   Anemia   Hypotension   Elevated troponin   Arm swelling   Chronic anticoagulation   Acute on chronic combined systolic (congestive) and diastolic (congestive) heart failure (HCC)   HCAP (healthcare-associated pneumonia)   DVT (deep venous thrombosis) (HCC)   Right ventricular dysfunction   Acute exacerbation of chronic diastolic & systolic heart failure  Presented with SOB, lower extremity edema and BNP of 2000, also recently diagnosed with pneumonia. Follow daily weight, strict I&Os. Monitor renal function and electrolytes daily Echo shows EF reduced from previous 08/2014 at 55-60% now down to 25-30% Not on ACE/ARBi due to history of chronic kidney disease and improved EF, continue Isordil Appreciate cardiology recommendations Third spacing  -spoke with niece regarding palliative care  HCAP (healthcare-associated pneumonia) Started on Levaquin as outpatient. Continue Vancomycin and Cefepime Cultures are negative for now. Discontinue vancomycin and cefepime  Chronic respiratory failure Patient is on 2-3 L of oxygen at home. Currently oxygenating well on 3L.   Chronic kidney disease (CKD), stage III (moderate)  continue to monitor renal function daily with diuresis.   Coronary artery disease with HTN and HLD Elevated troponin, flat curve doubt ACS,  likely secondary to demand ischemia d/t acute CHF and HCAP Patient denies chest pain Continue ASA and statin  PVD s/p LLE fem-pop bypass- Evaluated 1/17 as outpatient and found to be patent. Continue ASA.  Anemia - baseline appears to be 8-9. Down 8.4 >>8.0 but no e/o active bleeding,  monitor.  RUE swelling - likely 2/2 small hematoma from IV draw. RUE Doppler negative for DVT. Maintain arm elevation.  History of DVT (HCC) - Unclear when this occurred and if lifelong anticoagulation is indicated. D/c Xarelto if ok with Cardiology. The patient is bed bound, consider switch to prophylactic lovenox for this admission.  Deconditioning: PT to eval and treat  Hypokalemia, resolved, now hyperkalemic Likely secondary to diuresis, repleted, now up to 5.4 - will hold K supplements and check BMP tomorrow  ? UTI yeast Continue Diflucan   Code Status: FULL Family Communication: niece on phone  Disposition Plan: palliative care consult--- back to SNF?? When ok with cards  Consultants:  Cardiology  Procedures:  ECHO 03/31/2015 Study Conclusions - Left ventricle: The cavity size was normal. Systolic function was severely reduced. The estimated ejection fraction was in therange of 25% to 30%. Diffuse hypokinesis. Features are consistentwith a pseudonormal left ventricular filling pattern, with concomitant abnormal relaxation and increased filling pressure (grade 2 diastolic dysfunction). - Ventricular septum: The contour showed diastolic flattening. - Aortic valve: Valve area (Vmax): 0.9 cm^2. - Mitral valve: There was moderate regurgitation directedeccentrically and toward the septum. - Left atrium: The atrium was moderately to severely dilated. - Right ventricle: The cavity size was moderately dilated. Systolicfunction was moderately reduced. - Right atrium: The atrium was mildly dilated. - Tricuspid valve: There was severe regurgitation. - Pulmonic valve: There was moderate regurgitation. - Pulmonary arteries: Systolic pressure was moderately increased.PA peak pressure: 62 mm Hg (S). - Pericardium, extracardiac: A trivial pericardial effusion wasidentified. There was a left pleural effusion.  Antimicrobials:  Vancomycin started 1/19- d/c'd  Cefepime started  1/19-d/c'd  Diflucan started 1/20    Objective: Filed  Vitals:   04/05/15 0419 04/05/15 1123  BP: 98/66 86/68  Pulse: 107 92  Temp: 97.6 F (36.4 C) 98.2 F (36.8 C)  Resp: 18 18    Intake/Output Summary (Last 24 hours) at 04/05/15 1152 Last data filed at 04/05/15 1043  Gross per 24 hour  Intake    360 ml  Output    700 ml  Net   -340 ml   Filed Weights   04/03/15 0443 04/04/15 0529 04/05/15 0419  Weight: 84.8 kg (186 lb 15.2 oz) 86.9 kg (191 lb 9.3 oz) 86.32 kg (190 lb 4.8 oz)    Exam:   General:  Pleasant/cooperative  Cardiovascular: RRR, 2-3+ BLE edema  Respiratory: tachypnea, conversational dyspnea, diminished breath sounds bilaterally  Abdomen: obese, soft, nontender, nondistended  Musculoskeletal: RUE edema with ecchymosis over anterior forearm   Data Reviewed: Basic Metabolic Panel:  Recent Labs Lab 03/31/15 0200  03/31/15 1720 04/01/15 0336 04/02/15 0428 04/03/15 0356 04/04/15 0425 04/05/15 0443  NA  --   < >  --  140 141 140 140 139  K  --   < >  --  3.2* 3.5 5.4* 4.3 4.3  CL  --   < >  --  104 102 105 98* 95*  CO2  --   < >  --  32 30  GLUCOSE  --   < >  --  174* 100* 93 108* 100*  BUN  --   < >  --  21* 19 26* 27* 25*  CREATININE  --   < >  --  0.88 0.82 1.07* 1.06* 1.09*  CALCIUM  --   < >  --  8.4* 8.6* 8.7* 9.0 8.5*  MG 1.7  --  1.7  --  1.8  --   --   --   PHOS 3.2  --   --   --   --   --  3.8 3.4  < > = values in this interval not displayed. Liver Function Tests:  Recent Labs Lab 03/31/15 0609 04/04/15 0425 04/05/15 0443  AST 17  --   --   ALT 17  --   --   ALKPHOS 52  --   --   BILITOT 0.9  --   --   PROT 6.0*  --   --   ALBUMIN 2.3* 2.3* 2.3*   No results for input(s): LIPASE, AMYLASE in the last 168 hours. No results for input(s): AMMONIA in the last 168 hours. CBC:  Recent Labs Lab 03/30/15 1616 03/31/15 0609 04/01/15 0336  WBC 11.1* 10.8* 9.4  NEUTROABS 8.6* 8.0*  --   HGB 9.0* 8.4* 8.0*  HCT  31.3* 28.1* 27.0*  MCV 87.7 86.7 87.4  PLT 216 201 178   Cardiac Enzymes:  Recent Labs Lab 03/31/15 0210 03/31/15 0609 03/31/15 1155  TROPONINI 0.18* 0.26* 0.25*   BNP (last 3 results)  Recent Labs  02/22/15 1020 03/30/15 1615 03/31/15 0435  BNP 2229.9* 1747.2* 2002.7*    ProBNP (last 3 results) No results for input(s): PROBNP in the last 8760 hours.  CBG: No results for input(s): GLUCAP in the last 168 hours.  Recent Results (from the past 240 hour(s))  Culture, Urine     Status: None   Collection Time: 03/31/15 12:14 AM  Result Value Ref Range Status   Specimen Description URINE, CATHETERIZED  Final   Special Requests ADDED [id number]  Final   Culture 20,000 COLONIES/mL YEAST  Final  Report Status 04/01/2015 FINAL  Final  MRSA PCR Screening     Status: None   Collection Time: 03/31/15 12:20 AM  Result Value Ref Range Status   MRSA by PCR NEGATIVE NEGATIVE Final    Comment:        The GeneXpert MRSA Assay (FDA approved for NASAL specimens only), is one component of a comprehensive MRSA colonization surveillance program. It is not intended to diagnose MRSA infection nor to guide or monitor treatment for MRSA infections.   Culture, blood (Routine X 2) w Reflex to ID Panel     Status: None (Preliminary result)   Collection Time: 03/31/15  1:47 AM  Result Value Ref Range Status   Specimen Description BLOOD RIGHT HAND  Final   Special Requests IN PEDIATRIC BOTTLE  Final   Culture NO GROWTH 4 DAYS  Final   Report Status PENDING  Incomplete  Culture, blood (Routine X 2) w Reflex to ID Panel     Status: None (Preliminary result)   Collection Time: 03/31/15  8:30 AM  Result Value Ref Range Status   Specimen Description BLOOD LEFT ANTECUBITAL  Final   Special Requests BOTTLES DRAWN AEROBIC ONLY 5CC  Final   Culture NO GROWTH 4 DAYS  Final   Report Status PENDING  Incomplete     Studies: Dg Chest Port 1 View  04/03/2015  CLINICAL DATA:  Central  catheter placement EXAM: PORTABLE CHEST 1 VIEW COMPARISON:  March 30, 2015 FINDINGS: Central catheter tip is in the superior vena cava. No pneumothorax. There remain bilateral pleural effusions with bilateral lower lobe region consolidation, stable. Heart is enlarged with pulmonary vascularity within normal limits. No adenopathy apparent. No bone lesions. IMPRESSION: Central catheter tip in superior vena cava. Findings consistent with congestive heart failure. Question alveolar edema versus superimposed pneumonia in both lower lobe regions. Both congestive heart failure and pneumonia may exist concurrently. The appearance of the lungs and cardiac silhouette is essentially stable compared to recent prior study. Electronically Signed   By: Bretta Bang III M.D.   On: 04/03/2015 12:32    Scheduled Meds: . aspirin EC  81 mg Oral Daily  . atorvastatin  20 mg Oral Daily  . fluconazole  100 mg Oral Daily  . furosemide  40 mg Intravenous Once  . furosemide  40 mg Intravenous BID  . levofloxacin  250 mg Oral Once  . [START ON 04/07/2015] levofloxacin  750 mg Oral Q48H  . magnesium oxide  400 mg Oral Daily  . sodium chloride  3 mL Intravenous Q12H   Continuous Infusions:   Active Problems:   HYPERTENSION, BENIGN SYSTEMIC   Chronic kidney disease (CKD), stage III (moderate)   Mitral insufficiency   Coronary artery disease   Anemia   Hypotension   Elevated troponin   Arm swelling   Chronic anticoagulation   Acute on chronic combined systolic (congestive) and diastolic (congestive) heart failure (HCC)   HCAP (healthcare-associated pneumonia)   DVT (deep venous thrombosis) (HCC)   Right ventricular dysfunction    Time spent: 25 minutes       Marlin Canary [facility] Pager: [phone]  04/05/2015, 11:52 AM

## 2015-04-06 DIAGNOSIS — I509 Heart failure, unspecified: Secondary | ICD-10-CM

## 2015-04-06 LAB — RENAL FUNCTION PANEL
ALBUMIN: 2.2 g/dL — AB (ref 3.5–5.0)
Anion gap: 7 (ref 5–15)
BUN: 27 mg/dL — AB (ref 6–20)
CALCIUM: 9.1 mg/dL (ref 8.9–10.3)
CO2: 33 mmol/L — ABNORMAL HIGH (ref 22–32)
CREATININE: 1.15 mg/dL — AB (ref 0.44–1.00)
Chloride: 100 mmol/L — ABNORMAL LOW (ref 101–111)
GFR, EST AFRICAN AMERICAN: 51 mL/min — AB (ref >60)
GFR, EST NON AFRICAN AMERICAN: 44 mL/min — AB (ref >60)
Glucose, Bld: 101 mg/dL — ABNORMAL HIGH (ref 65–99)
PHOSPHORUS: 3.4 mg/dL (ref 2.5–4.6)
Potassium: 4.2 mmol/L (ref 3.5–5.1)
Sodium: 140 mmol/L (ref 135–145)

## 2015-04-06 NOTE — Progress Notes (Signed)
Patient discussed with Dr. Benjamine Mola. Tentative plan is for return to [facility] tomorrow; hopefully will have Palliative Care visit prior to d/c- however- if this doesn't happen-Palliative care can follow up with patient/family at the facility.  Above discussed with patient and her niece Ellenor who are agreeable with return to [facility]. CSW spoke with Olegario Messier- RN Liaison for the SNF- bed will be available for patient. Niece requested assistance with completion/notarization of a Durable Power of Attorney for patient; explained as a representative of the hospital- our notaries would not be able to assist with this.  Explained this process and encouraged her to follow up with community notary after d/c. She verbalized understanding of above.  Lorri Frederick. Jaci Lazier, [place] [phone]

## 2015-04-06 NOTE — Care Management Important Message (Signed)
Important Message  Patient Details  Name: Karina Willis MRN: [id number] Date of Birth: 04-Jul-1932   Medicare Important Message Given:  Yes    Oralia Rud Jessenia Filippone 04/06/2015, 2:29 PM

## 2015-04-06 NOTE — Progress Notes (Signed)
    No further cardiac recs at this time Agree with palliative consult Not candidate for advanced therapies.  Will sign off. Please call if ?  Donato Schultz, MD

## 2015-04-06 NOTE — Progress Notes (Signed)
[facility] PROGRESS NOTE  Karina Willis ZOX:[id number] DOB: 1932-08-29 DOA: 03/30/2015 PCP: Peter [place], MD    HPI/Subjective: C/o of her "bum" hurting  Assessment/Plan: Active Problems:   HYPERTENSION, BENIGN SYSTEMIC   Chronic kidney disease (CKD), stage III (moderate)   Mitral insufficiency   Coronary artery disease   Anemia   Hypotension   Elevated troponin   Arm swelling   Chronic anticoagulation   Acute on chronic combined systolic (congestive) and diastolic (congestive) heart failure (HCC)   HCAP (healthcare-associated pneumonia)   DVT (deep venous thrombosis) (HCC)   Right ventricular dysfunction   Acute exacerbation of chronic diastolic & systolic heart failure  Presented with SOB, lower extremity edema and BNP of 2000, also recently diagnosed with pneumonia. Follow daily weight, strict I&Os. Monitor renal function and electrolytes daily Echo shows EF reduced from previous 08/2014 at 55-60% now down to 25-30% Not on ACE/ARBi due to history of chronic kidney disease and improved EF, continue Isordil Appreciate cardiology recommendations- not having much luck getting fluid off Third spacing  -spoke with niece regarding palliative care- await consult  HCAP (healthcare-associated pneumonia) Started on Levaquin as outpatient Cultures are negative for now. Discontinue vancomycin and cefepime  Chronic respiratory failure Patient is on 2-3 L of oxygen at home. Currently oxygenating well on 3L.   Chronic kidney disease (CKD), stage III (moderate)  continue to monitor renal function daily with diuresis.   Coronary artery disease with HTN and HLD Elevated troponin, flat curve doubt ACS,  likely secondary to demand ischemia d/t acute CHF and HCAP Patient denies chest pain Continue ASA and statin  PVD s/p LLE fem-pop bypass- Evaluated 1/17 as outpatient and found to be patent. Continue ASA.  Anemia - baseline appears to be 8-9. Down 8.4 >>8.0 but no e/o active  bleeding, monitor.  RUE swelling - likely 2/2 small hematoma from IV draw. RUE Doppler negative for DVT. Maintain arm elevation.  History of DVT (HCC) - Unclear when this occurred and if lifelong anticoagulation is indicated. D/c Xarelto if ok with Cardiology. The patient is bed bound, consider switch to prophylactic lovenox for this admission.  Deconditioning: -SNF once goals  Hypokalemia, resolved, now hyperkalemic Likely secondary to diuresis, repleted, now up to 5.4 - will hold K supplements and check BMP tomorrow  ? UTI yeast Continue Diflucan   Code Status: FULL Family Communication: niece on phone  Disposition Plan: palliative care consult--- back to SNF?? When ok with cards  Consultants:  Cardiology  Procedures:  ECHO 03/31/2015 Study Conclusions - Left ventricle: The cavity size was normal. Systolic function was severely reduced. The estimated ejection fraction was in therange of 25% to 30%. Diffuse hypokinesis. Features are consistentwith a pseudonormal left ventricular filling pattern, with concomitant abnormal relaxation and increased filling pressure (grade 2 diastolic dysfunction). - Ventricular septum: The contour showed diastolic flattening. - Aortic valve: Valve area (Vmax): 0.9 cm^2. - Mitral valve: There was moderate regurgitation directedeccentrically and toward the septum. - Left atrium: The atrium was moderately to severely dilated. - Right ventricle: The cavity size was moderately dilated. Systolicfunction was moderately reduced. - Right atrium: The atrium was mildly dilated. - Tricuspid valve: There was severe regurgitation. - Pulmonic valve: There was moderate regurgitation. - Pulmonary arteries: Systolic pressure was moderately increased.PA peak pressure: 62 mm Hg (S). - Pericardium, extracardiac: A trivial pericardial effusion wasidentified. There was a left pleural effusion.  Antimicrobials:  Vancomycin started 1/19- d/c'd  Cefepime  started 1/19-d/c'd  Diflucan started 1/20  Objective: Filed Vitals:   04/06/15 0600 04/06/15 0938  BP: 92/45 97/50  Pulse: 87 92  Temp: 98.2 F (36.8 C)   Resp: 18     Intake/Output Summary (Last 24 hours) at 04/06/15 0952 Last data filed at 04/06/15 0849  Gross per 24 hour  Intake    710 ml  Output   1425 ml  Net   -715 ml   Filed Weights   04/04/15 0529 04/05/15 0419 04/06/15 0600  Weight: 86.9 kg (191 lb 9.3 oz) 86.32 kg (190 lb 4.8 oz) 86.456 kg (190 lb 9.6 oz)    Exam:   General:  Pleasant/cooperative  Cardiovascular: RRR, 2-3+ BLE edema  Respiratory: tachypnea, conversational dyspnea, diminished breath sounds bilaterally  Abdomen: obese, soft, nontender, nondistended  Musculoskeletal: RUE edema with ecchymosis over anterior forearm   Data Reviewed: Basic Metabolic Panel:  Recent Labs Lab 03/31/15 0200  03/31/15 1720  04/02/15 0428 04/03/15 0356 04/04/15 0425 04/05/15 0443 04/06/15 0515  NA  --   < >  --   < > 141 140 140 139 140  K  --   < >  --   < > 3.5 5.4* 4.3 4.3 4.2  CL  --   < >  --   < > 102 105 98* 95* 100*  CO2  --   < >  --   < > 30 24 32 30 33*  GLUCOSE  --   < >  --   < > 100* 93 108* 100* 101*  BUN  --   < >  --   < > 19 26* 27* 25* 27*  CREATININE  --   < >  --   < > 0.82 1.07* 1.06* 1.09* 1.15*  CALCIUM  --   < >  --   < > 8.6* 8.7* 9.0 8.5* 9.1  MG 1.7  --  1.7  --  1.8  --   --   --   --   PHOS 3.2  --   --   --   --   --  3.8 3.4 3.4  < > = values in this interval not displayed. Liver Function Tests:  Recent Labs Lab 03/31/15 0609 04/04/15 0425 04/05/15 0443 04/06/15 0515  AST 17  --   --   --   ALT 17  --   --   --   ALKPHOS 52  --   --   --   BILITOT 0.9  --   --   --   PROT 6.0*  --   --   --   ALBUMIN 2.3* 2.3* 2.3* 2.2*   No results for input(s): LIPASE, AMYLASE in the last 168 hours. No results for input(s): AMMONIA in the last 168 hours. CBC:  Recent Labs Lab 03/30/15 1616 03/31/15 0609  04/01/15 0336  WBC 11.1* 10.8* 9.4  NEUTROABS 8.6* 8.0*  --   HGB 9.0* 8.4* 8.0*  HCT 31.3* 28.1* 27.0*  MCV 87.7 86.7 87.4  PLT 216 201 178   Cardiac Enzymes:  Recent Labs Lab 03/31/15 0210 03/31/15 0609 03/31/15 1155  TROPONINI 0.18* 0.26* 0.25*   BNP (last 3 results)  Recent Labs  02/22/15 1020 03/30/15 1615 03/31/15 0435  BNP 2229.9* 1747.2* 2002.7*    ProBNP (last 3 results) No results for input(s): PROBNP in the last 8760 hours.  CBG: No results for input(s): GLUCAP in the last 168 hours.  Recent Results (from the past 240 hour(s))  Culture,  Urine     Status: None   Collection Time: 03/31/15 12:14 AM  Result Value Ref Range Status   Specimen Description URINE, CATHETERIZED  Final   Special Requests ADDED [id number]  Final   Culture 20,000 COLONIES/mL YEAST  Final   Report Status 04/01/2015 FINAL  Final  MRSA PCR Screening     Status: None   Collection Time: 03/31/15 12:20 AM  Result Value Ref Range Status   MRSA by PCR NEGATIVE NEGATIVE Final    Comment:        The GeneXpert MRSA Assay (FDA approved for NASAL specimens only), is one component of a comprehensive MRSA colonization surveillance program. It is not intended to diagnose MRSA infection nor to guide or monitor treatment for MRSA infections.   Culture, blood (Routine X 2) w Reflex to ID Panel     Status: None   Collection Time: 03/31/15  1:47 AM  Result Value Ref Range Status   Specimen Description BLOOD RIGHT HAND  Final   Special Requests IN PEDIATRIC BOTTLE  Final   Culture NO GROWTH 5 DAYS  Final   Report Status 04/05/2015 FINAL  Final  Culture, blood (Routine X 2) w Reflex to ID Panel     Status: None   Collection Time: 03/31/15  8:30 AM  Result Value Ref Range Status   Specimen Description BLOOD LEFT ANTECUBITAL  Final   Special Requests BOTTLES DRAWN AEROBIC ONLY 5CC  Final   Culture NO GROWTH 5 DAYS  Final   Report Status 04/05/2015 FINAL  Final     Studies: No  results found.  Scheduled Meds: . aspirin EC  81 mg Oral Daily  . atorvastatin  20 mg Oral Daily  . fluconazole  100 mg Oral Daily  . furosemide  40 mg Intravenous Once  . furosemide  40 mg Intravenous BID  . [START ON 04/07/2015] levofloxacin  750 mg Oral Q48H  . magnesium oxide  400 mg Oral Daily  . sodium chloride  3 mL Intravenous Q12H   Continuous Infusions:   Active Problems:   HYPERTENSION, BENIGN SYSTEMIC   Chronic kidney disease (CKD), stage III (moderate)   Mitral insufficiency   Coronary artery disease   Anemia   Hypotension   Elevated troponin   Arm swelling   Chronic anticoagulation   Acute on chronic combined systolic (congestive) and diastolic (congestive) heart failure (HCC)   HCAP (healthcare-associated pneumonia)   DVT (deep venous thrombosis) (HCC)   Right ventricular dysfunction    Time spent: 25 minutes       Marlin Canary [facility] Pager: [phone]  04/06/2015, 9:52 AM

## 2015-04-07 DIAGNOSIS — Z66 Do not resuscitate: Secondary | ICD-10-CM | POA: Insufficient documentation

## 2015-04-07 DIAGNOSIS — Z515 Encounter for palliative care: Secondary | ICD-10-CM | POA: Insufficient documentation

## 2015-04-07 DIAGNOSIS — Z7189 Other specified counseling: Secondary | ICD-10-CM | POA: Insufficient documentation

## 2015-04-07 LAB — RENAL FUNCTION PANEL
Albumin: 2.1 g/dL — ABNORMAL LOW (ref 3.5–5.0)
Anion gap: 7 (ref 5–15)
BUN: 27 mg/dL — ABNORMAL HIGH (ref 6–20)
CHLORIDE: 101 mmol/L (ref 101–111)
CO2: 33 mmol/L — ABNORMAL HIGH (ref 22–32)
Calcium: 9 mg/dL (ref 8.9–10.3)
Creatinine, Ser: 1.06 mg/dL — ABNORMAL HIGH (ref 0.44–1.00)
GFR, EST AFRICAN AMERICAN: 56 mL/min — AB (ref >60)
GFR, EST NON AFRICAN AMERICAN: 48 mL/min — AB (ref >60)
Glucose, Bld: 96 mg/dL (ref 65–99)
POTASSIUM: 4 mmol/L (ref 3.5–5.1)
Phosphorus: 3.3 mg/dL (ref 2.5–4.6)
Sodium: 141 mmol/L (ref 135–145)

## 2015-04-07 MED ORDER — OXYCODONE HCL 5 MG PO CAPS: 5.0000 mg | ORAL_CAPSULE | ORAL | Status: AC | PRN

## 2015-04-07 MED ORDER — FUROSEMIDE 40 MG PO TABS
40.0000 mg | ORAL_TABLET | Freq: Every day | ORAL | Status: DC
  Administered 2015-04-07: 40 mg via ORAL
  Filled 2015-04-07: qty 1

## 2015-04-07 MED ORDER — MAGNESIUM OXIDE 400 (241.3 MG) MG PO TABS: 400.0000 mg | ORAL_TABLET | Freq: Every day | ORAL | Status: AC

## 2015-04-07 MED ORDER — LORAZEPAM 0.5 MG PO TABS: 0.5000 mg | ORAL_TABLET | Freq: Two times a day (BID) | ORAL | Status: AC | PRN

## 2015-04-07 NOTE — Consult Note (Signed)
Consultation Note Date: 04/07/2015   Patient Name: Karina Willis  DOB: June 03, 1932  MRN: [id number]  Age / Sex: 82 y.o., female  PCP: Peter M Martinique, MD Referring Physician: Geradine Girt, DO  Reason for Consultation: Establishing goals of care    Clinical Assessment/Narrative: Extremely pleasant 82 yo female.  Has CAD/CHF/Chronic respiratory failure/PAD, severe mitral MR, admitted to the hospital with HCAP and heart failure exacerbation.  2D echo revealed a significant decline in LVEF (25%).  Patient is too hypotensive to diuresis well.    I met with the patient and had a detailed phone conversation with her niece (HCPOA) Maxie Barb.  The patient tells me she has a son and a daughter in [place].  She also has siblings in [place], but she states that [employer] all of my business" and she has recently signed [place] electing Barnett Applebaum to make her decisions.     Barnett Applebaum felt it best that the patient be DNR / DNI.  She does not want aggressive medical care or invasive procedures.  She wants her aunt to be clean/dry/comfortable and well cared for.  Barnett Applebaum explicitly states that she wants her Aunt to be lucid and not be sedated with a lot of medications.  She would like Hospice services for extra support at [facility] - and she would like Hospice to help with wound care.   Contacts/Participants in Discussion:  Patient and HCPOA Primary Decision Maker: [facility]) Relationship to Patient:  Neice HCPOA: Yes   SUMMARY OF RECOMMENDATIONS  Code Status/Advance Care Planning: DNR    Code Status Orders        Start     Ordered   04/07/15 1001  Do not attempt resuscitation (DNR)   Continuous    Question Answer Comment  In the event of cardiac or respiratory ARREST Do not call a "code blue"   In the event of cardiac or respiratory ARREST Do not perform Intubation, CPR, defibrillation or ACLS     In the event of cardiac or respiratory ARREST Use medication by any route, position, wound care, and other measures to relive pain and suffering. May use oxygen, suction and manual treatment of airway obstruction as needed for comfort.      04/07/15 1000    Code Status History    Date Active Date Inactive Code Status Order ID Comments User Context   03/30/2015 11:48 PM 04/07/2015 10:00 AM Full Code [id number]  Toy Baker, MD Inpatient   02/21/2015  7:03 PM 02/23/2015  5:40 PM Full Code [id number]  Mariel Aloe, MD Inpatient   09/26/2014  4:16 PM 09/28/2014  8:20 PM Full Code [id number]  Ulyses Amor, PA-C Inpatient   09/23/2014  5:56 PM 09/26/2014  4:16 PM Full Code [id number]  Corky Sox, MD Inpatient   09/21/2014  1:06 PM 09/21/2014  9:08 PM Full Code [id number]  Rosetta Posner, MD Inpatient   08/14/2014  3:06 PM 08/16/2014  8:36 PM Full Code [id number]  Juluis Mire, MD ED   08/01/2013  3:37 PM 08/04/2013  6:51 PM Full Code [id number]  Bernadene Bell, MD Inpatient      Other Directives:   Comfort Measures,   Wound Care,   minimize medications,   return to [facility] with Hospice support.    Do not overly sedate the patient will pain medications.  Symptom Management:   Per Dr. Eliseo Squires:  Patient is on ativan q 12 PRN and Percocet 5-325 q 6 PRN  Palliative Prophylaxis:   Aspiration, Bowel Regimen, Frequent Pain Assessment, Palliative Wound Care and Turn Reposition   Psycho-social/Spiritual:  Support System: Strong Desire for further Chaplaincy support:no   Prognosis: < 3 months  Discharge Planning: [place] with Hospice   Chief Complaint/ Primary Diagnoses: Present on Admission:  . Anemia . Chronic kidney disease (CKD), stage III (moderate) . Coronary artery disease . Elevated troponin . HYPERTENSION, BENIGN SYSTEMIC . Arm swelling . Acute on chronic combined systolic (congestive) and diastolic (congestive) heart failure ([place]) . HCAP (healthcare-associated  pneumonia) . DVT (deep venous thrombosis) ([place]) . Mitral insufficiency . Hypotension  I have reviewed the medical record, interviewed the patient and family, and examined the patient. The following aspects are pertinent.  Past Medical History  Diagnosis Date  . CHF (congestive heart failure) ([place])   . Mitral insufficiency   . Coronary artery disease 2001    WITH DOCUMENTED TOTAL OCCLUSION OF THE RIGHT CORONARY AND THE LEFT CIRCUMFLEX CORONARY  . SOB (shortness of breath)   . Hypertension   . Hyperlipidemia   . History of TIAs   . Arthritis   . Non compliance w medication regimen   . Neck pain   . PAD (peripheral artery disease) ([place])   . Critical lower limb ischemia   . Pulmonary HTN ([place])    Social History   Social History  . Marital Status: Divorced    Spouse Name: N/A  . Number of Children: 0  . Years of Education: N/A   Occupational History  . waitress     retired   Social History Main Topics  . Smoking status: Former Smoker    Types: Cigarettes    Quit date: 08/21/1983  . Smokeless tobacco: Never Used  . Alcohol Use: No  . Drug Use: No  . Sexual Activity: Not Asked   Other Topics Concern  . None   Social History Narrative   Family History  Problem Relation Age of Onset  . Heart attack Mother   . Heart attack Father   . Heart disease Sister   . Heart attack Brother   . Heart attack Brother   . Heart attack Brother   . Heart disease Sister    Scheduled Meds: . aspirin EC  81 mg Oral Daily  . fluconazole  100 mg Oral Daily  . furosemide  40 mg Intravenous Once  . furosemide  40 mg Oral Daily  . levofloxacin  750 mg Oral Q48H  . magnesium oxide  400 mg Oral Daily  . sodium chloride  3 mL Intravenous Q12H   Continuous Infusions:  PRN Meds:.sodium chloride, albuterol, docusate sodium, LORazepam, oxyCODONE-acetaminophen, polyethylene glycol, sodium chloride, sodium chloride Medications Prior to Admission:  Prior to Admission medications     Medication Sig Start Date End Date Taking? Authorizing Provider  acetaminophen (TYLENOL) 325 MG tablet Take 2 tablets (650 mg total) by mouth every 6 (six) hours as needed for moderate pain. 08/04/13  Yes Leone Brand, MD  albuterol (PROVENTIL) (2.5 MG/3ML) 0.083% nebulizer solution Take 2.5 mg by nebulization every 6 (six) hours as needed for wheezing or shortness of breath.   Yes Historical Provider, MD  aspirin EC 81 MG EC tablet Take 1 tablet (81 mg total) by mouth daily. 09/28/14  Yes Lucious Groves, DO  aspirin-acetaminophen-caffeine (EXCEDRIN MIGRAINE) [id number] MG per tablet Take 1 tablet by mouth every 6 (six) hours as needed for headache.   Yes Historical Provider, MD  atorvastatin (LIPITOR) 20 MG tablet  Take 20 mg by mouth daily.   Yes Historical Provider, MD  diphenhydrAMINE (BENADRYL) 25 MG tablet Take 25 mg by mouth every 6 (six) hours as needed for itching.   Yes Historical Provider, MD  docusate sodium (COLACE) 100 MG capsule Take 1 capsule (100 mg total) by mouth 2 (two) times daily as needed for mild constipation. 09/28/14  Yes Lucious Groves, DO  folic acid (FOLVITE) 1 MG tablet Take 1 tablet (1 mg total) by mouth daily. 09/28/14  Yes Lucious Groves, DO  furosemide (LASIX) 20 MG tablet Take 40 mg by mouth daily.    Yes Historical Provider, MD  guaiFENesin (MUCINEX) 600 MG 12 hr tablet Take 600 mg by mouth 2 (two) times daily. x7 days   Yes Historical Provider, MD  isosorbide dinitrate (ISORDIL) 10 MG tablet Take 1 tablet (10 mg total) by mouth 3 (three) times daily. 02/23/15  Yes Verner Mould, MD  LORazepam (ATIVAN) 0.5 MG tablet Take 0.5 mg by mouth every 12 (twelve) hours as needed for anxiety.   Yes Historical Provider, MD  nitroGLYCERIN (NITROSTAT) 0.4 MG SL tablet Place 1 tablet (0.4 mg total) under the tongue every 5 (five) minutes as needed for chest pain. Must keep appointment 10/31/14 with Dr Martinique 09/16/14  Yes Peter M Martinique, MD  oxycodone (OXY-IR) 5 MG capsule  Take 5 mg by mouth every 4 (four) hours as needed for pain.   Yes Historical Provider, MD  oxyCODONE-acetaminophen (PERCOCET/ROXICET) 5-325 MG per tablet Take 1 tablet by mouth every 6 (six) hours as needed for moderate pain. 09/28/14  Yes Lucious Groves, DO  phenol (CHLORASEPTIC) 1.4 % LIQD Use as directed 1 spray in the mouth or throat as needed for throat irritation / pain. 09/28/14  Yes Lucious Groves, DO  polyethylene glycol (MIRALAX / GLYCOLAX) packet Take 17 g by mouth daily as needed. 09/28/14  Yes Lucious Groves, DO  Potassium Chloride (KLOR-CON 10 PO) Take 10 mg by mouth daily.   Yes Historical Provider, MD  Rivaroxaban (XARELTO) 15 MG TABS tablet Take 15 mg by mouth daily with supper.   Yes Historical Provider, MD  sacubitril-valsartan (ENTRESTO) 24-26 MG Take 1 tablet by mouth 2 (two) times daily.   Yes Historical Provider, MD  vitamin B-12 1000 MCG tablet Take 1 tablet (1,000 mcg total) by mouth daily. 08/16/14  Yes Tasrif Ahmed, MD  hydrocerin (EUCERIN) CREA Apply 1 application topically 2 (two) times daily. 08/16/14   Dellia Nims, MD   Allergies  Allergen Reactions  . Penicillins Hives and Itching    Pt reports not being allergic to penicillin Tolerates Ancef    Review of Systems  Patient does not complain of pain, anxiety, dyspnea, she is having regular bowel movements.  Physical Exam  Frail, elderly female.  Conversant, appropriate CV difficult to hear, no M/R/G Respiration:  Mildly increased work of breathing, worse with speaking. Abdomen:  Soft Extremities:  2+ edema in arms, 1+ in LE bilaterally.  Vital Signs: BP 86/48 mmHg  Pulse 88  Temp(Src) 98.1 F (36.7 C) (Oral)  Resp 20  Ht _0  (1.499 m)  Wt 88.315 kg (194 lb 11.2 oz)  BMI 39.30 kg/m2  SpO2 100%  SpO2: SpO2: 100 % O2 Device:SpO2: 100 % O2 Flow Rate: .O2 Flow Rate (L/min): 3 L/min  IO: Intake/output summary:   Intake/Output Summary (Last 24 hours) at 04/07/15 1043 Last data filed at 04/07/15 1032   Gross per 24 hour  Intake  850 ml  Output   2000 ml  Net  -1150 ml    LBM: Last BM Date: 04/04/15 Baseline Weight: Weight: 85.5 kg (188 lb 7.9 oz) Most recent weight: Weight: 88.315 kg (194 lb 11.2 oz)      Palliative Assessment/Data:  Flowsheet Rows        Most Recent Value   Intake Tab    Referral Department  Hospitalist   Unit at Time of Referral  Cardiac/Telemetry Unit   Palliative Care Primary Diagnosis  Cardiac   Date Notified  04/05/15   Palliative Care Type  New Palliative care   Reason for referral  Pain, Clarify Goals of Care   Date of Admission  03/30/15   Date first seen by Palliative Care  04/07/15   # of days Palliative referral response time  2 Day(s)   # of days IP prior to Palliative referral  6   Clinical Assessment    Palliative Performance Scale Score  20%   Pain Max last 24 hours  5   Pain Min Last 24 hours  1   Psychosocial & Spiritual Assessment    Palliative Care Outcomes    Patient/Family meeting held?  Yes   Who was at the meeting?  Neice Maxie Barb, [place]   Palliative Care Outcomes  Transitioned to hospice, Clarified goals of care, Completed durable DNR   Patient/Family wishes: Interventions discontinued/not started   Mechanical Ventilation   Palliative Care follow-up planned  No      Additional Data Reviewed:  CBC:    Component Value Date/Time   WBC 9.4 04/01/2015 0336   HGB 8.0* 04/01/2015 0336   HCT 27.0* 04/01/2015 0336   PLT 178 04/01/2015 0336   MCV 87.4 04/01/2015 0336   NEUTROABS 8.0* 03/31/2015 0609   LYMPHSABS 1.4 03/31/2015 0609   MONOABS 1.3* 03/31/2015 0609   EOSABS 0.1 03/31/2015 0609   BASOSABS 0.0 03/31/2015 0609   Comprehensive Metabolic Panel:    Component Value Date/Time   NA 141 04/07/2015 0458   K 4.0 04/07/2015 0458   CL 101 04/07/2015 0458   CO2 33* 04/07/2015 0458   BUN 27* 04/07/2015 0458   CREATININE 1.06* 04/07/2015 0458   GLUCOSE 96 04/07/2015 0458   CALCIUM 9.0 04/07/2015 0458   AST 17  03/31/2015 0609   ALT 17 03/31/2015 0609   ALKPHOS 52 03/31/2015 0609   BILITOT 0.9 03/31/2015 0609   PROT 6.0* 03/31/2015 0609   ALBUMIN 2.1* 04/07/2015 0458     Time In: 9:30 Time Out: 10:40 Time Total: 70 min Greater than 50%  of this time was spent counseling and coordinating care related to the above assessment and plan.  Signed by:  Imogene Burn, PA-C Palliative Medicine Pager: [phone]  04/07/2015, 10:43 AM  Please contact Palliative Medicine Team phone at [phone] for questions and concerns.

## 2015-04-07 NOTE — [place] FL2 (Signed)
[place] MEDICAID FL2 LEVEL OF CARE SCREENING TOOL     IDENTIFICATION  Patient Name: Karina Willis Birthdate: 01-28-1933 Sex: female Admission Date (Current Location): 03/30/2015  [facility] and [place] Number:  Producer, television/film/video and Address:  [facility] [place], [address], [place], [place] [address]      Provider Number: [id number]  Attending Physician Name and Address:  Joseph Art, DO  Relative Name and Phone Number:       Current Level of Care: Hospital Recommended Level of Care: Nursing Facility Prior Approval Number:    Date Approved/Denied:   PASRR Number:    Discharge Plan: Home    Current Diagnoses: Patient Active Problem List   Diagnosis Date Noted  . Palliative care encounter   . DNR (do not resuscitate)   . Advanced directives, counseling/discussion   . Right ventricular dysfunction 04/05/2015  . CAP (community acquired pneumonia) 03/30/2015  . Arm swelling 03/30/2015  . Chronic anticoagulation 03/30/2015  . Acute on chronic combined systolic (congestive) and diastolic (congestive) heart failure (HCC) 03/30/2015  . HCAP (healthcare-associated pneumonia) 03/30/2015  . DVT (deep venous thrombosis) (HCC) 03/30/2015  . PVD (peripheral vascular disease) (HCC) 03/28/2015  . Aftercare following surgery of the circulatory system 03/28/2015  . Swelling of limb 03/28/2015  . Chest pain   . Acute on chronic congestive heart failure (HCC)   . Elevated troponin 02/22/2015  . Symptomatic anemia   . Pulmonary HTN (HCC) 10/31/2014  . Severe peripheral arterial disease (HCC) 09/28/2014  . Pressure ulcer 09/23/2014  . B12 deficiency 08/15/2014  . Moderate malnutrition (HCC) 08/15/2014  . Prediabetes 08/15/2014  . Cellulitis of great toe of left foot 08/15/2014  . Hypotension 08/15/2014  . AKI (acute kidney injury) (HCC) 08/14/2014  . Hypovolemia 08/14/2014  . Anemia 08/03/2013  . Bilateral hip pain 08/01/2013  . Fall at home 08/01/2013   . Chronic diastolic CHF (congestive heart failure) (HCC)   . Mitral insufficiency   . Coronary artery disease   . SOB (shortness of breath)   . History of TIAs   . Arthritis   . B12 deficiency anemia 04/24/2009  . UNSPECIFIED VITAMIN D DEFICIENCY 06/13/2008  . Chronic kidney disease (CKD), stage III (moderate) 06/13/2008  . POSTMENOPAUSAL STATUS 06/03/2008  . KNEE PAIN, LEFT 12/03/2007  . HYPERCHOLESTEROLEMIA 05/08/2006  . OBESITY, NOS 05/08/2006  . HYPERTENSION, BENIGN SYSTEMIC 05/08/2006  . COR PULMONALE 05/08/2006  . Mitral valve disorder 05/08/2006  . OSTEOARTHRITIS, MULTI SITES 05/08/2006    Orientation RESPIRATION BLADDER Height & Weight    Self, Time, Situation, Place  Normal Incontinent  194   BEHAVIORAL SYMPTOMS/MOOD NEUROLOGICAL BOWEL NUTRITION STATUS      Continent  (2 gram sodium)  AMBULATORY STATUS COMMUNICATION OF NEEDS Skin   Limited Assist Verbally Normal                       Personal Care Assistance Level of Assistance  Bathing, Feeding, Dressing Bathing Assistance: Limited assistance Feeding assistance: Limited assistance Dressing Assistance: Limited assistance     Functional Limitations Info             SPECIAL CARE FACTORS FREQUENCY  Hospice referral- [facility]                     Contractures      Additional Factors Info  Allergies  PCNS  Hospice referal  [facility] hospice  Current Medications (04/07/2015):  This is the current hospital active medication list Current Facility-Administered Medications  Medication Dose Route Frequency Provider Last Rate Last Dose  . 0.9 %  sodium chloride infusion  250 mL Intravenous PRN Therisa Doyne, MD 10 mL/hr at 04/04/15 1651 250 mL at 04/04/15 1651  . albuterol (PROVENTIL) (2.5 MG/3ML) 0.083% nebulizer solution 2.5 mg  2.5 mg Nebulization Q6H PRN Therisa Doyne, MD   2.5 mg at 04/03/15 0242  . aspirin EC tablet 81 mg  81 mg Oral Daily Therisa Doyne, MD   81 mg at 04/07/15 1052  . docusate sodium (COLACE) capsule 100 mg  100 mg Oral BID PRN Therisa Doyne, MD   100 mg at 04/04/15 1039  . fluconazole (DIFLUCAN) tablet 100 mg  100 mg Oral Daily Clydia Llano, MD   100 mg at 04/07/15 1052  . furosemide (LASIX) injection 40 mg  40 mg Intravenous Once Jake Bathe, MD   Stopped at 04/03/15 1430  . furosemide (LASIX) tablet 40 mg  40 mg Oral Daily Joseph Art, DO   40 mg at 04/07/15 1052  . levofloxacin (LEVAQUIN) tablet 750 mg  750 mg Oral Q48H Jessica U Vann, DO   750 mg at 04/07/15 1052  . LORazepam (ATIVAN) tablet 0.5 mg  0.5 mg Oral Q12H PRN Therisa Doyne, MD   0.5 mg at 04/06/15 2349  . magnesium oxide (MAG-OX) tablet 400 mg  400 mg Oral Daily Clydia Llano, MD   400 mg at 04/07/15 1051  . oxyCODONE-acetaminophen (PERCOCET/ROXICET) 5-325 MG per tablet 1 tablet  1 tablet Oral Q6H PRN Therisa Doyne, MD   1 tablet at 04/06/15 1149  . polyethylene glycol (MIRALAX / GLYCOLAX) packet 17 g  17 g Oral Daily PRN Therisa Doyne, MD   17 g at 04/04/15 1039  . sodium chloride 0.9 % injection 10-40 mL  10-40 mL Intracatheter PRN Clydia Llano, MD   10 mL at 04/07/15 0905  . sodium chloride 0.9 % injection 3 mL  3 mL Intravenous Q12H Therisa Doyne, MD   3 mL at 04/07/15 1000  . sodium chloride 0.9 % injection 3 mL  3 mL Intravenous PRN Therisa Doyne, MD         Discharge Medications: Please see discharge summary for a list of discharge medications.  Relevant Imaging Results:  Relevant Lab Results:   Additional Information  SSN:   [id number] [place], [place]

## 2015-04-07 NOTE — Progress Notes (Signed)
PT Cancellation Note  Patient Details Name: Karina Willis MRN: [id number] DOB: [birth year]   Cancelled Treatment:    Reason Eval/Treat Not Completed: Medical issues which prohibited therapy (Pt d/c today comfort care.  Defer treatment to Hospice/SNF). Thanks.    Tawni Millers F 04/07/2015, 2:08 PM [facility] [phone] (pager)

## 2015-04-07 NOTE — Progress Notes (Signed)
Pt has orders to be discharged to SNF. Report given to receiving facility. Pt verbalized understanding and has no additional questions. Telemetry box removed. PICC line removed from left arm and site in good condition. Pt stable and waiting for transportation.

## 2015-04-07 NOTE — Discharge Summary (Addendum)
Physician Discharge Summary  Karina Willis:[id number] DOB: May 15, 1932 DOA: 03/30/2015  PCP: Karina [place], MD  Admit date: 03/30/2015 Discharge date: 04/07/2015  Time spent: 35 minutes  Recommendations for Outpatient Follow-up:  1. Comfort care- DO NOT RE HOSPITALIZE 2. DNR- hospice to follow at SNF 3. To SNF 4. PRN O2   Discharge Diagnoses:  Active Problems:   HYPERTENSION, BENIGN SYSTEMIC   Chronic kidney disease (CKD), stage III (moderate)   Mitral insufficiency   Coronary artery disease   Anemia   Hypotension   Elevated troponin   Arm swelling   Chronic anticoagulation   Acute on chronic combined systolic (congestive) and diastolic (congestive) heart failure (HCC)   HCAP (healthcare-associated pneumonia)   DVT (deep venous thrombosis) (HCC)   Right ventricular dysfunction   Discharge Condition: terminal  Diet recommendation: regular  Filed Weights   04/05/15 0419 04/06/15 0600 04/07/15 0635  Weight: 86.32 kg (190 lb 4.8 oz) 86.456 kg (190 lb 9.6 oz) 88.315 kg (194 lb 11.2 oz)    History of present illness:  Karina Willis is a 82 y.o. female   has a past medical history of CHF (congestive heart failure) (HCC); Mitral insufficiency; Coronary artery disease (2001); SOB (shortness of breath); Hypertension; Hyperlipidemia; History of TIAs; Arthritis; Non compliance w medication regimen; Neck pain; PAD (peripheral artery disease) (HCC); Critical lower limb ischemia; and Pulmonary HTN (HCC).   Presented from nursing home facility with diagnosis of pneumonia 3 days ago as well as lower extremity edema for the past 2 weeks she was treated with Levaquin but did not improve. She was treated with Lasix at the nursing facility but unclear if she was able to diuresis. Patient is on Oxygen for the past 1 week but unsure how many litters.  Patient reports severe arm pain in the right forearm perivascular surgery stated they will concern for possible hematoma secondary to recent  blood draw. She is on chronic anticoagulation Patient currently resides at nursing facility she requires total assist from bed to chair unable to transfer.  Hospital Course:  Acute exacerbation of chronic diastolic & systolic heart failure  Comfort focus after meeting with palliative and niece and patient -per cards: Not candidate for advanced therapies -continue current meds and no escalation (lasix etc)  HCAP (healthcare-associated pneumonia) D/c abx  Chronic respiratory failure Patient is on 2-3 L of oxygen at home. Currently oxygenating well on 3L.   Chronic kidney disease (CKD), stage III (moderate)  continue to monitor renal function daily with diuresis.   Coronary artery disease with HTN and HLD Comfort folcus  PVD s/p LLE fem-pop bypass- Evaluated 1/17 as outpatient and found to be patent. Continue ASA.  Anemia - baseline appears to be 8-9. Down 8.4 >>8.0 but no e/o active bleeding, monitor.  RUE swelling - likely 2/2 small hematoma from IV draw. RUE Doppler negative for DVT. Maintain arm elevation.  History of DVT (HCC) -  Comfort focus  Deconditioning: -SNF with hospice  Hypokalemia, resolved, now hyperkalemic Likely secondary to diuresis, repleted  ? UTI yeast treated   Procedures:    Consultations:  Cards/palliative care  Discharge Exam: Filed Vitals:   04/06/15 2031 04/07/15 0635  BP: 84/46 86/48  Pulse: 94 88  Temp: 98 F (36.7 C) 98.1 F (36.7 C)  Resp: 18 20    General: pleasant/cooperative   Discharge Instructions   Discharge Instructions    Diet - low sodium heart healthy    Complete by:  As directed  Discharge instructions    Complete by:  As directed   DNR Hospice to follow at SNF-- comfort care- DO NOT REHOSPITALIZE PRN O2     Increase activity slowly    Complete by:  As directed           Current Discharge Medication List    START taking these medications   Details  magnesium oxide (MAG-OX) 400 (241.3 Mg) MG  tablet Take 1 tablet (400 mg total) by mouth daily.      CONTINUE these medications which have CHANGED   Details  oxycodone (OXY-IR) 5 MG capsule Take 1 capsule (5 mg total) by mouth every 4 (four) hours as needed for pain. Qty: 10 capsule, Refills: 0      CONTINUE these medications which have NOT CHANGED   Details  acetaminophen (TYLENOL) 325 MG tablet Take 2 tablets (650 mg total) by mouth every 6 (six) hours as needed for moderate pain.    albuterol (PROVENTIL) (2.5 MG/3ML) 0.083% nebulizer solution Take 2.5 mg by nebulization every 6 (six) hours as needed for wheezing or shortness of breath.    aspirin EC 81 MG EC tablet Take 1 tablet (81 mg total) by mouth daily.    diphenhydrAMINE (BENADRYL) 25 MG tablet Take 25 mg by mouth every 6 (six) hours as needed for itching.    docusate sodium (COLACE) 100 MG capsule Take 1 capsule (100 mg total) by mouth 2 (two) times daily as needed for mild constipation. Qty: 10 capsule, Refills: 0    folic acid (FOLVITE) 1 MG tablet Take 1 tablet (1 mg total) by mouth daily.    furosemide (LASIX) 20 MG tablet Take 40 mg by mouth daily.     LORazepam (ATIVAN) 0.5 MG tablet Take 0.5 mg by mouth every 12 (twelve) hours as needed for anxiety.    phenol (CHLORASEPTIC) 1.4 % LIQD Use as directed 1 spray in the mouth or throat as needed for throat irritation / pain. Refills: 0    polyethylene glycol (MIRALAX / GLYCOLAX) packet Take 17 g by mouth daily as needed. Qty: 14 each, Refills: 0    vitamin B-12 1000 MCG tablet Take 1 tablet (1,000 mcg total) by mouth daily. Qty: 30 tablet, Refills: 3    hydrocerin (EUCERIN) CREA Apply 1 application topically 2 (two) times daily. Qty: 454 g, Refills: 3      STOP taking these medications     aspirin-acetaminophen-caffeine (EXCEDRIN MIGRAINE) 250-250-65 MG per tablet      atorvastatin (LIPITOR) 20 MG tablet      guaiFENesin (MUCINEX) 600 MG 12 hr tablet      isosorbide dinitrate (ISORDIL) 10 MG tablet       nitroGLYCERIN (NITROSTAT) 0.4 MG SL tablet      oxyCODONE-acetaminophen (PERCOCET/ROXICET) 5-325 MG per tablet      Potassium Chloride (KLOR-CON 10 PO)      Rivaroxaban (XARELTO) 15 MG TABS tablet      sacubitril-valsartan (ENTRESTO) 24-26 MG        Allergies  Allergen Reactions  . Penicillins Hives and Itching    Pt reports not being allergic to penicillin Tolerates Ancef      The results of significant diagnostics from this hospitalization (including imaging, microbiology, ancillary and laboratory) are listed below for reference.    Significant Diagnostic Studies: Dg Chest 2 View  03/30/2015  CLINICAL DATA:  Shortness of breath. Diffuse chest pain. Cough. Recently diagnosed pneumonia. EXAM: CHEST  2 VIEW COMPARISON:  02/22/2015. FINDINGS: Stable enlarged cardiac  silhouette. Interval moderate-sized left pleural effusion. A moderate-sized left pleural effusion is again demonstrated. Interval increased density in the right lower lung zone and mildly increased density in the left lower lung zone. Thoracic spine and bilateral shoulder degenerative changes. IMPRESSION: 1. Interval moderate-sized right pleural effusion without significant change in a moderate-sized left pleural effusion. 2. Interval right basilar atelectasis or pneumonia. 3. Mildly increased left basilar atelectasis or pneumonia. 4. Stable cardiomegaly. Electronically Signed   By: Beckie Salts M.D.   On: 03/30/2015 16:39   Dg Chest Port 1 View  04/03/2015  CLINICAL DATA:  Central catheter placement EXAM: PORTABLE CHEST 1 VIEW COMPARISON:  March 30, 2015 FINDINGS: Central catheter tip is in the superior vena cava. No pneumothorax. There remain bilateral pleural effusions with bilateral lower lobe region consolidation, stable. Heart is enlarged with pulmonary vascularity within normal limits. No adenopathy apparent. No bone lesions. IMPRESSION: Central catheter tip in superior vena cava. Findings consistent with  congestive heart failure. Question alveolar edema versus superimposed pneumonia in both lower lobe regions. Both congestive heart failure and pneumonia may exist concurrently. The appearance of the lungs and cardiac silhouette is essentially stable compared to recent prior study. Electronically Signed   By: Bretta Bang III M.D.   On: 04/03/2015 12:32    Microbiology: Recent Results (from the past 240 hour(s))  Culture, Urine     Status: None   Collection Time: 03/31/15 12:14 AM  Result Value Ref Range Status   Specimen Description URINE, CATHETERIZED  Final   Special Requests ADDED [id number]  Final   Culture 20,000 COLONIES/mL YEAST  Final   Report Status 04/01/2015 FINAL  Final  MRSA PCR Screening     Status: None   Collection Time: 03/31/15 12:20 AM  Result Value Ref Range Status   MRSA by PCR NEGATIVE NEGATIVE Final    Comment:        The GeneXpert MRSA Assay (FDA approved for NASAL specimens only), is one component of a comprehensive MRSA colonization surveillance program. It is not intended to diagnose MRSA infection nor to guide or monitor treatment for MRSA infections.   Culture, blood (Routine X 2) w Reflex to ID Panel     Status: None   Collection Time: 03/31/15  1:47 AM  Result Value Ref Range Status   Specimen Description BLOOD RIGHT HAND  Final   Special Requests IN PEDIATRIC BOTTLE  Final   Culture NO GROWTH 5 DAYS  Final   Report Status 04/05/2015 FINAL  Final  Culture, blood (Routine X 2) w Reflex to ID Panel     Status: None   Collection Time: 03/31/15  8:30 AM  Result Value Ref Range Status   Specimen Description BLOOD LEFT ANTECUBITAL  Final   Special Requests BOTTLES DRAWN AEROBIC ONLY 5CC  Final   Culture NO GROWTH 5 DAYS  Final   Report Status 04/05/2015 FINAL  Final     Labs: Basic Metabolic Panel:  Recent Labs Lab 03/31/15 1720  04/02/15 0428 04/03/15 0356 04/04/15 0425 04/05/15 0443 04/06/15 0515 04/07/15 0458  NA  --   < >  141 140 140 139 140 141  K  --   < > 3.5 5.4* 4.3 4.3 4.2 4.0  CL  --   < > 102 105 98* 95* 100* 101  CO2  --   < > 30 24 32 30 33* 33*  GLUCOSE  --   < > 100* 93 108* 100* 101* 96  BUN  --   < >  19 26* 27* 25* 27* 27*  CREATININE  --   < > 0.82 1.07* 1.06* 1.09* 1.15* 1.06*  CALCIUM  --   < > 8.6* 8.7* 9.0 8.5* 9.1 9.0  MG 1.7  --  1.8  --   --   --   --   --   PHOS  --   --   --   --  3.8 3.4 3.4 3.3  < > = values in this interval not displayed. Liver Function Tests:  Recent Labs Lab 04/04/15 0425 04/05/15 0443 04/06/15 0515 04/07/15 0458  ALBUMIN 2.3* 2.3* 2.2* 2.1*   No results for input(s): LIPASE, AMYLASE in the last 168 hours. No results for input(s): AMMONIA in the last 168 hours. CBC:  Recent Labs Lab 04/01/15 0336  WBC 9.4  HGB 8.0*  HCT 27.0*  MCV 87.4  PLT 178   Cardiac Enzymes:  Recent Labs Lab 03/31/15 1155  TROPONINI 0.25*   BNP: BNP (last 3 results)  Recent Labs  02/22/15 1020 03/30/15 1615 03/31/15 0435  BNP 2229.9* 1747.2* 2002.7*    ProBNP (last 3 results) No results for input(s): PROBNP in the last 8760 hours.  CBG: No results for input(s): GLUCAP in the last 168 hours.     Signed:  Grayson Pfefferle U Zarina Pe DO.  [facility] 04/07/2015, 10:30 AM

## 2015-04-10 DIAGNOSIS — I5022 Chronic systolic (congestive) heart failure: Secondary | ICD-10-CM | POA: Diagnosis not present

## 2015-04-10 DIAGNOSIS — I509 Heart failure, unspecified: Secondary | ICD-10-CM | POA: Diagnosis not present

## 2015-04-10 DIAGNOSIS — R600 Localized edema: Secondary | ICD-10-CM | POA: Diagnosis not present

## 2015-04-10 DIAGNOSIS — D649 Anemia, unspecified: Secondary | ICD-10-CM | POA: Diagnosis not present

## 2015-04-12 DIAGNOSIS — M6281 Muscle weakness (generalized): Secondary | ICD-10-CM | POA: Diagnosis not present

## 2015-04-12 DIAGNOSIS — I10 Essential (primary) hypertension: Secondary | ICD-10-CM | POA: Diagnosis not present

## 2015-04-12 DIAGNOSIS — L03119 Cellulitis of unspecified part of limb: Secondary | ICD-10-CM | POA: Diagnosis not present

## 2015-04-13 DIAGNOSIS — M6281 Muscle weakness (generalized): Secondary | ICD-10-CM | POA: Diagnosis not present

## 2015-04-13 DIAGNOSIS — I10 Essential (primary) hypertension: Secondary | ICD-10-CM | POA: Diagnosis not present

## 2015-04-13 DIAGNOSIS — L03119 Cellulitis of unspecified part of limb: Secondary | ICD-10-CM | POA: Diagnosis not present

## 2015-04-14 DIAGNOSIS — L03119 Cellulitis of unspecified part of limb: Secondary | ICD-10-CM | POA: Diagnosis not present

## 2015-04-14 DIAGNOSIS — M6281 Muscle weakness (generalized): Secondary | ICD-10-CM | POA: Diagnosis not present

## 2015-04-14 DIAGNOSIS — I10 Essential (primary) hypertension: Secondary | ICD-10-CM | POA: Diagnosis not present

## 2015-04-16 DIAGNOSIS — I10 Essential (primary) hypertension: Secondary | ICD-10-CM | POA: Diagnosis not present

## 2015-04-16 DIAGNOSIS — M6281 Muscle weakness (generalized): Secondary | ICD-10-CM | POA: Diagnosis not present

## 2015-04-16 DIAGNOSIS — L03119 Cellulitis of unspecified part of limb: Secondary | ICD-10-CM | POA: Diagnosis not present

## 2015-04-18 DIAGNOSIS — I10 Essential (primary) hypertension: Secondary | ICD-10-CM | POA: Diagnosis not present

## 2015-04-18 DIAGNOSIS — L03119 Cellulitis of unspecified part of limb: Secondary | ICD-10-CM | POA: Diagnosis not present

## 2015-04-18 DIAGNOSIS — M6281 Muscle weakness (generalized): Secondary | ICD-10-CM | POA: Diagnosis not present

## 2015-04-21 NOTE — Progress Notes (Signed)
Pt transferred to: [facility] (return) Anticipated date of transfer:04/07/2015 Transported by: Ambulance ([facility]) Time Tentatively Scheduled for: 3:00 PM  Family notified:Neice Ellenor Report # provided to nursing to call report. DC summary sent to facility for review. Patient's niece is agreeable to d/c and patient notified but remains confused.  Facility has bed available.  No further CSW needs identified.  CSW signing off.  Lovette Cliche, LCSW [phone]

## 2015-04-28 DIAGNOSIS — R451 Restlessness and agitation: Secondary | ICD-10-CM | POA: Diagnosis not present

## 2015-04-28 DIAGNOSIS — R0602 Shortness of breath: Secondary | ICD-10-CM | POA: Diagnosis not present

## 2015-04-28 DIAGNOSIS — R600 Localized edema: Secondary | ICD-10-CM | POA: Diagnosis not present

## 2015-04-28 DIAGNOSIS — I5022 Chronic systolic (congestive) heart failure: Secondary | ICD-10-CM | POA: Diagnosis not present

## 2015-05-01 DIAGNOSIS — I5022 Chronic systolic (congestive) heart failure: Secondary | ICD-10-CM | POA: Diagnosis not present

## 2015-05-10 DEATH — deceased

## 2015-10-08 IMAGING — CR DG CHEST 1V PORT
1 series · 1 of 1 positions shown · non-contrast
Comparison: 08/14/2014

CLINICAL DATA: Shortness of breath. Hypoxia. Congestive heart
failure. Coronary artery disease.

EXAM:
PORTABLE CHEST - 1 VIEW

[AP]
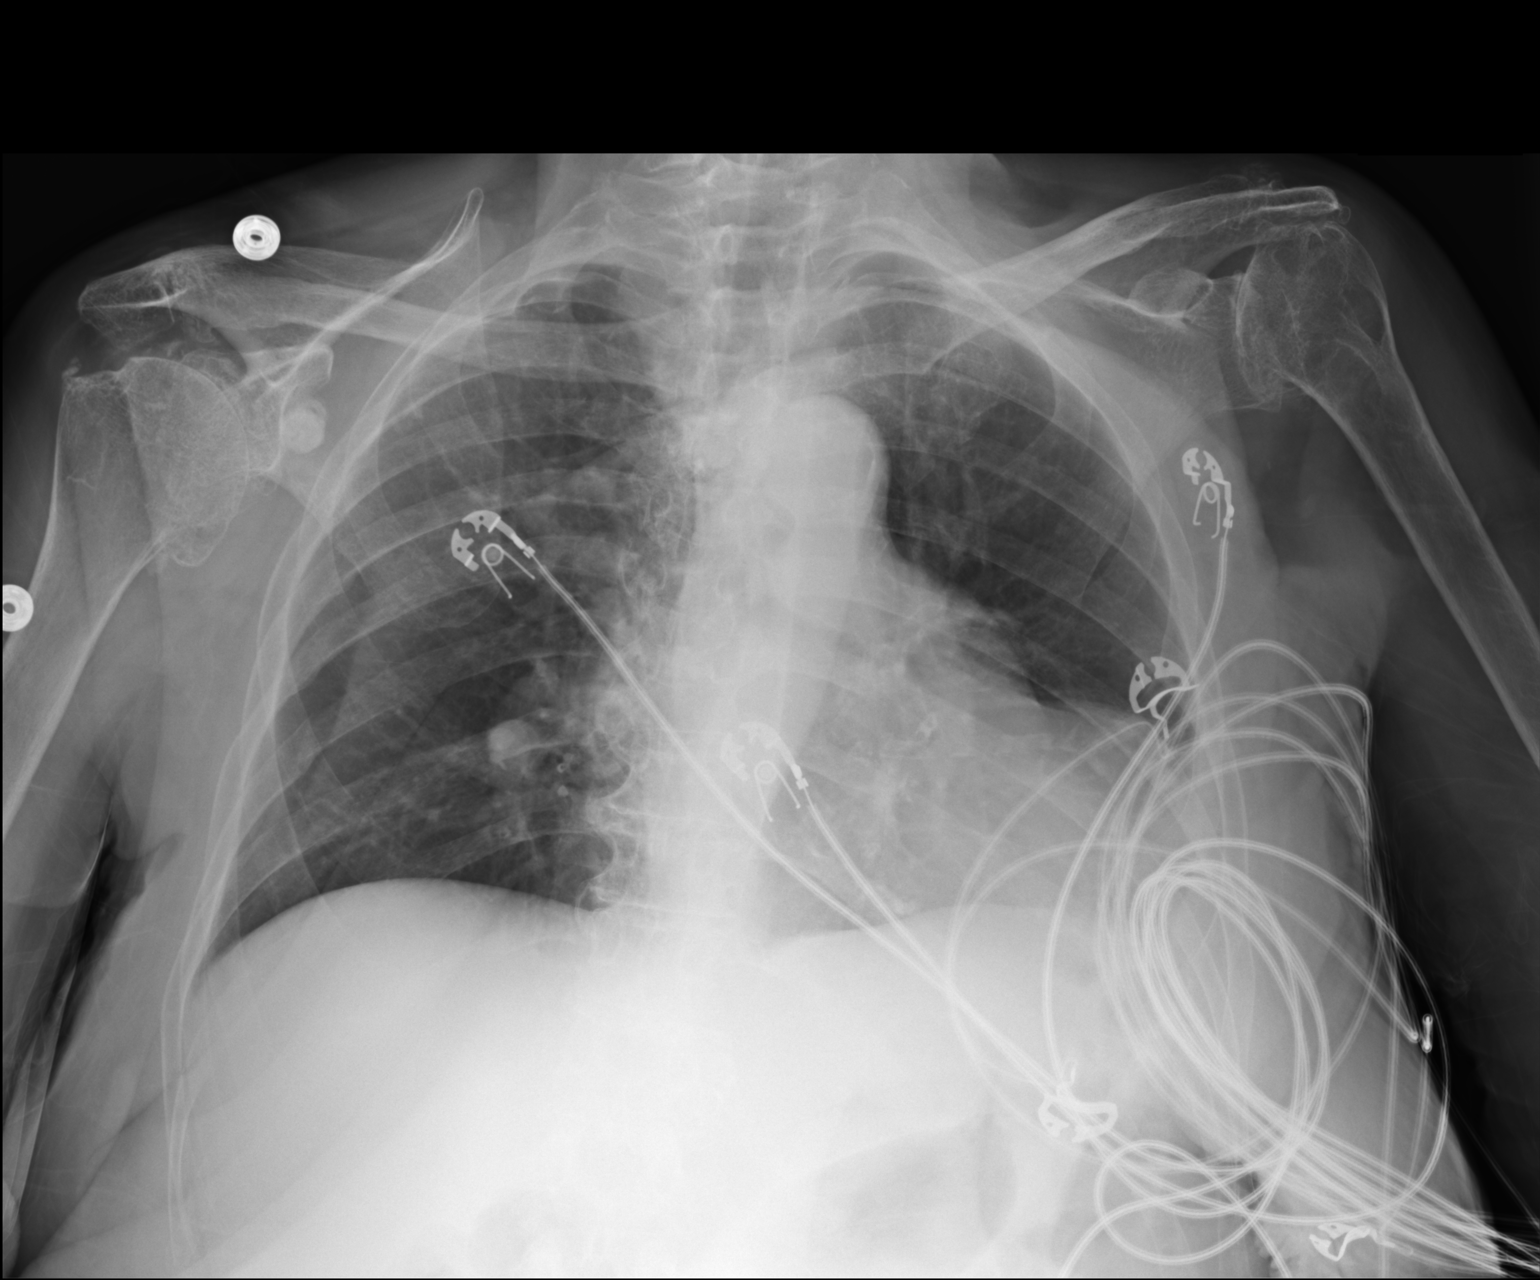

[1 of 1 positions shown; findings below may reference images not displayed]

FINDINGS: Low lung volumes are noted. Mild cardiomegaly stable. Both lungs are
clear. No evidence of pneumothorax or pleural effusion.
IMPRESSION: Mild cardiomegaly and low lung volumes.  No acute findings.

## 2016-03-08 IMAGING — CR DG CHEST 2V
2 series · 2 of 2 positions shown · non-contrast
Comparison: February 21, 2015

CLINICAL DATA: Left-sided chest pain

EXAM:
CHEST  2 VIEW

[chest lat]
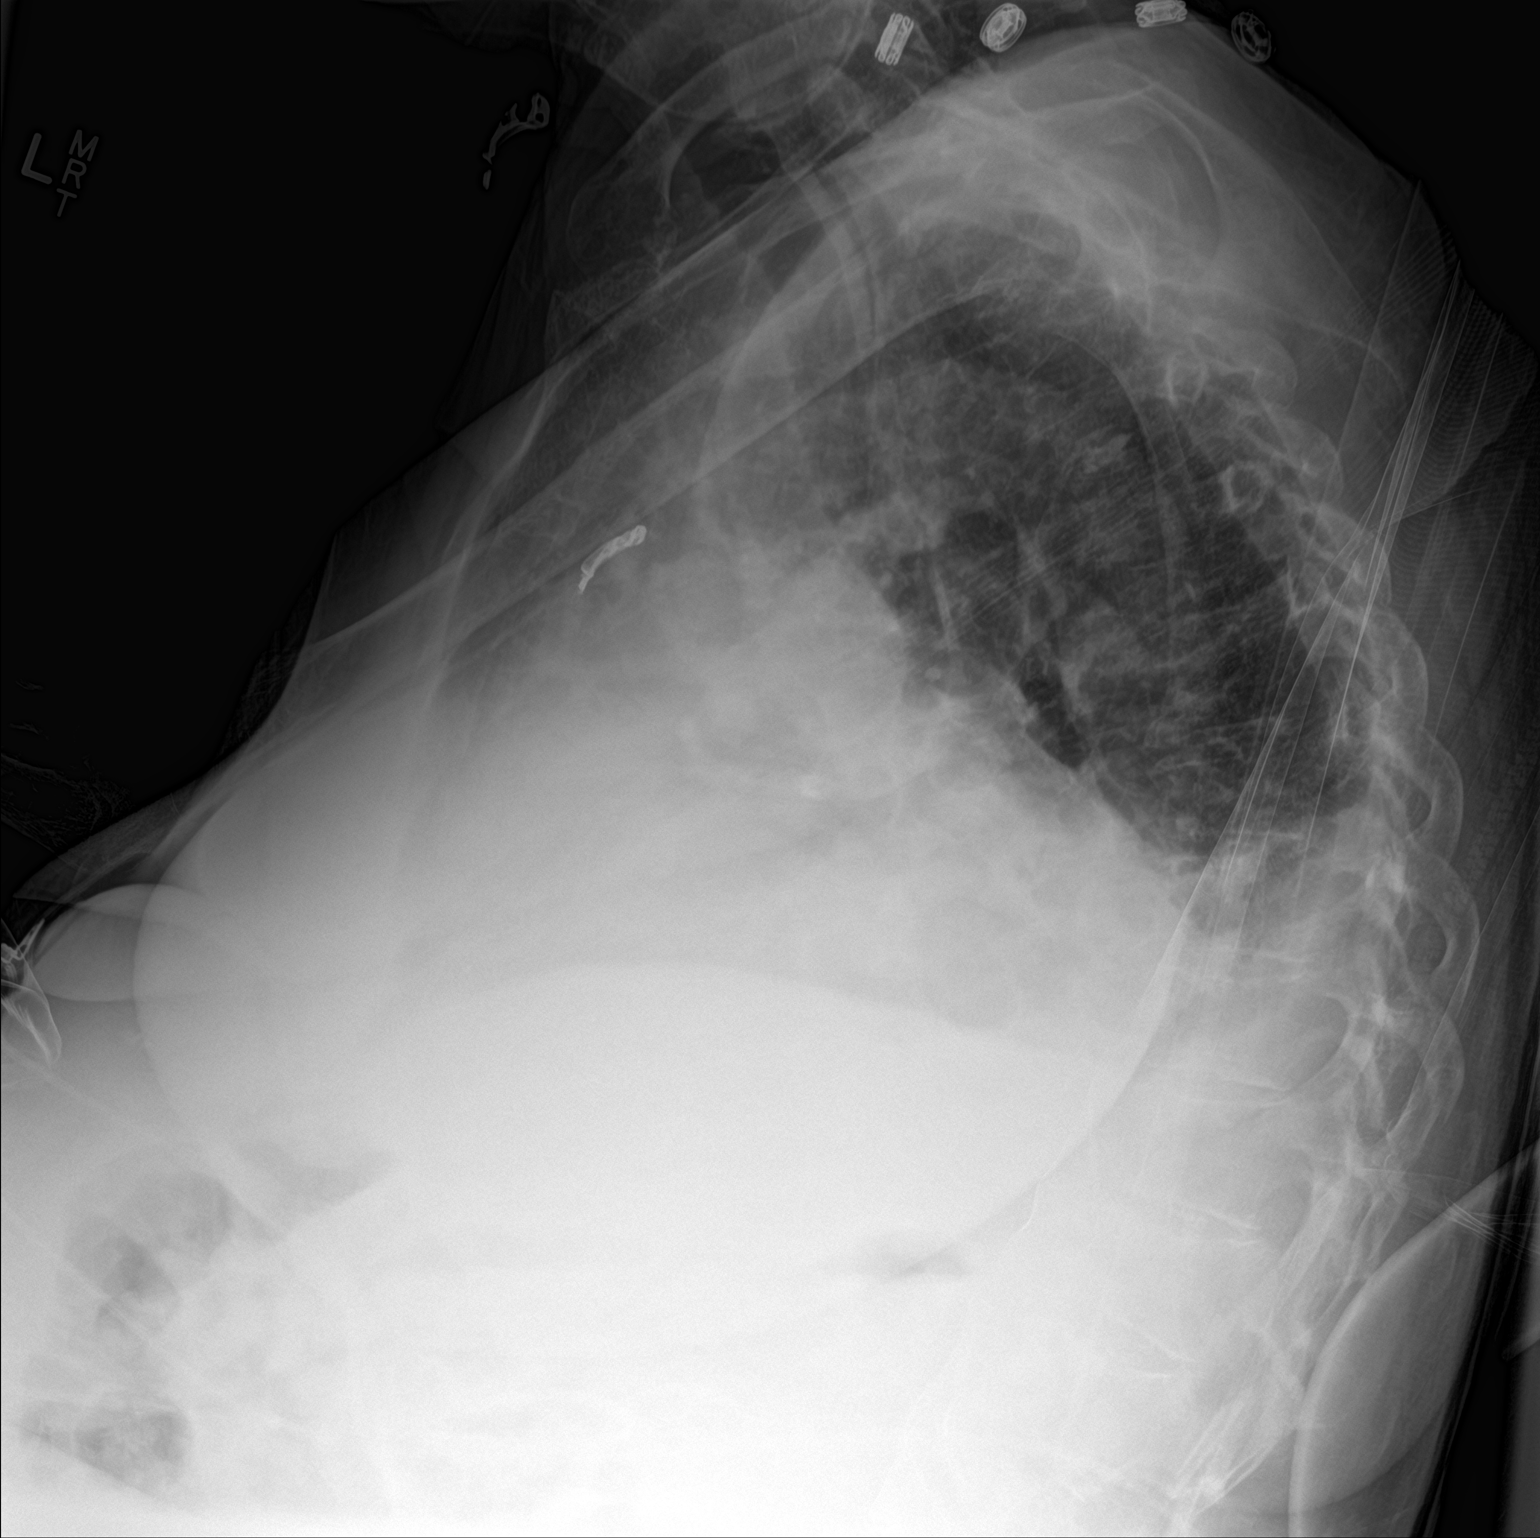

[chest ap]
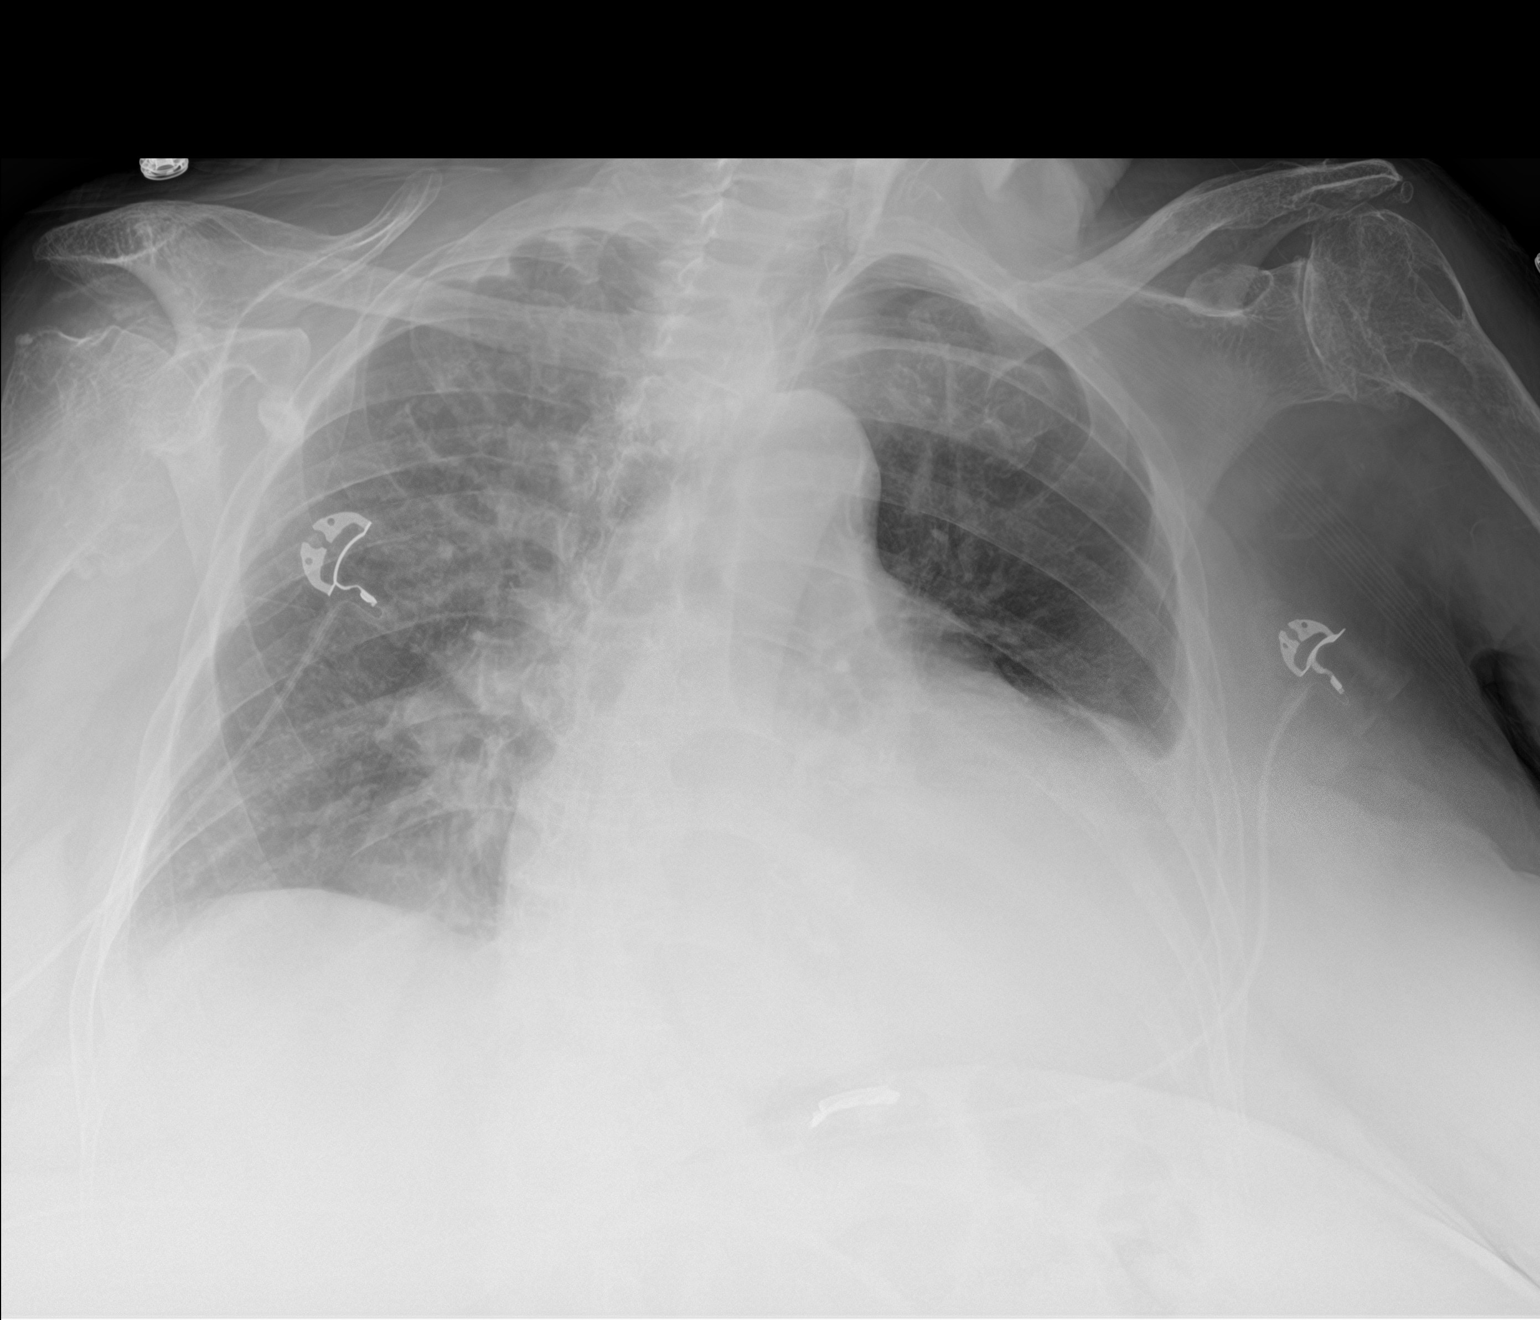

[2 of 2 positions shown; findings below may reference images not displayed]

FINDINGS: There is consolidation throughout the left lower lobe with left
effusion. Right lung is clear. There is cardiomegaly with pulmonary
vascularity within normal limits. No adenopathy. There is advanced
arthropathy in both shoulders with extensive remodeling of the left
humeral head. There is a Hill-Sachs defect on the right. Bones are
osteoporotic.
IMPRESSION: Left lower lobe consolidation with left effusion. Stable
cardiomegaly. Advanced arthropathy in both shoulders.

## 2016-04-13 IMAGING — DX DG CHEST 2V
2 series · 2 of 2 positions shown · non-contrast
Comparison: 02/22/2015.

CLINICAL DATA: Shortness of breath. Diffuse chest pain. Cough.
Recently diagnosed pneumonia.

EXAM:
CHEST  2 VIEW

[w chest lat]
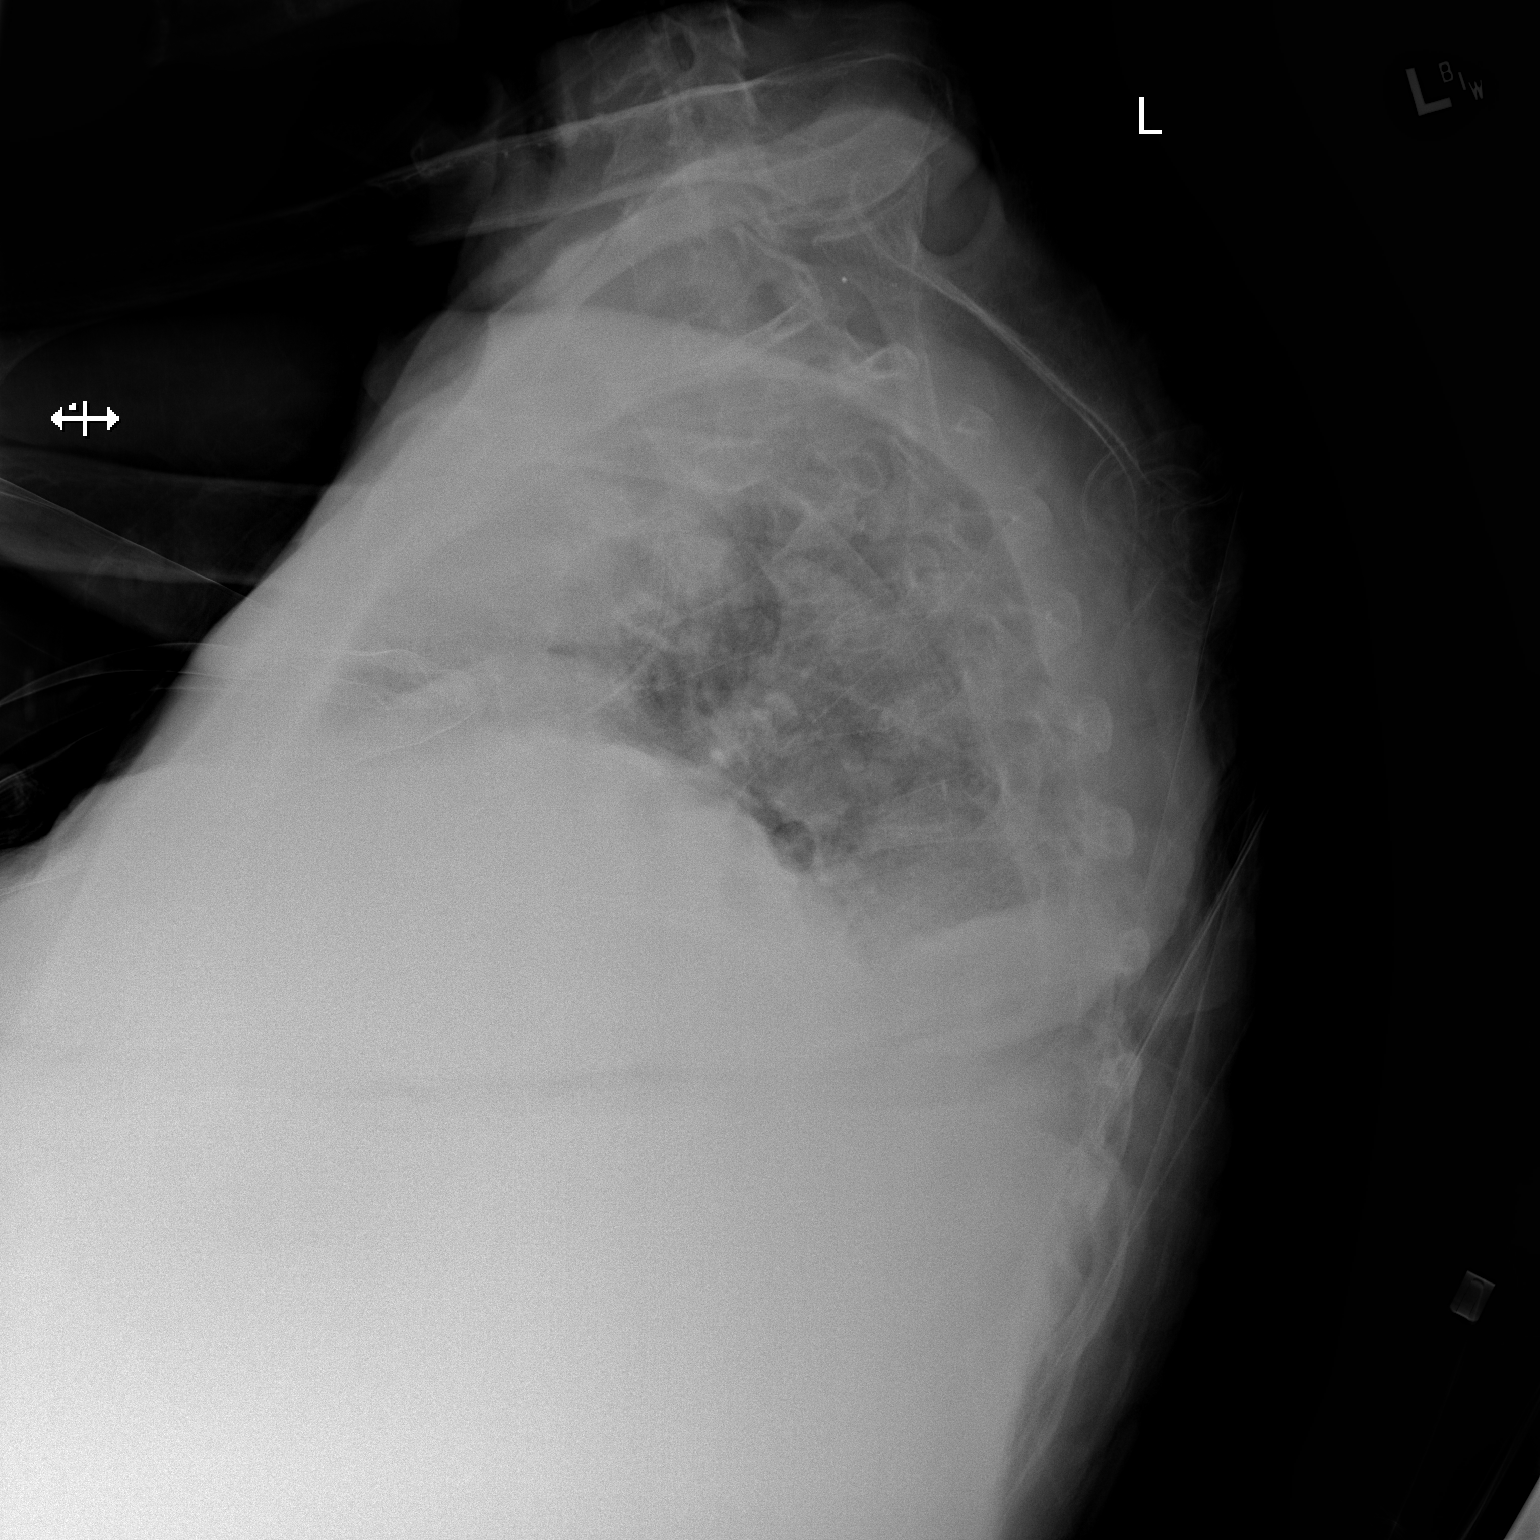

[x chest ap]
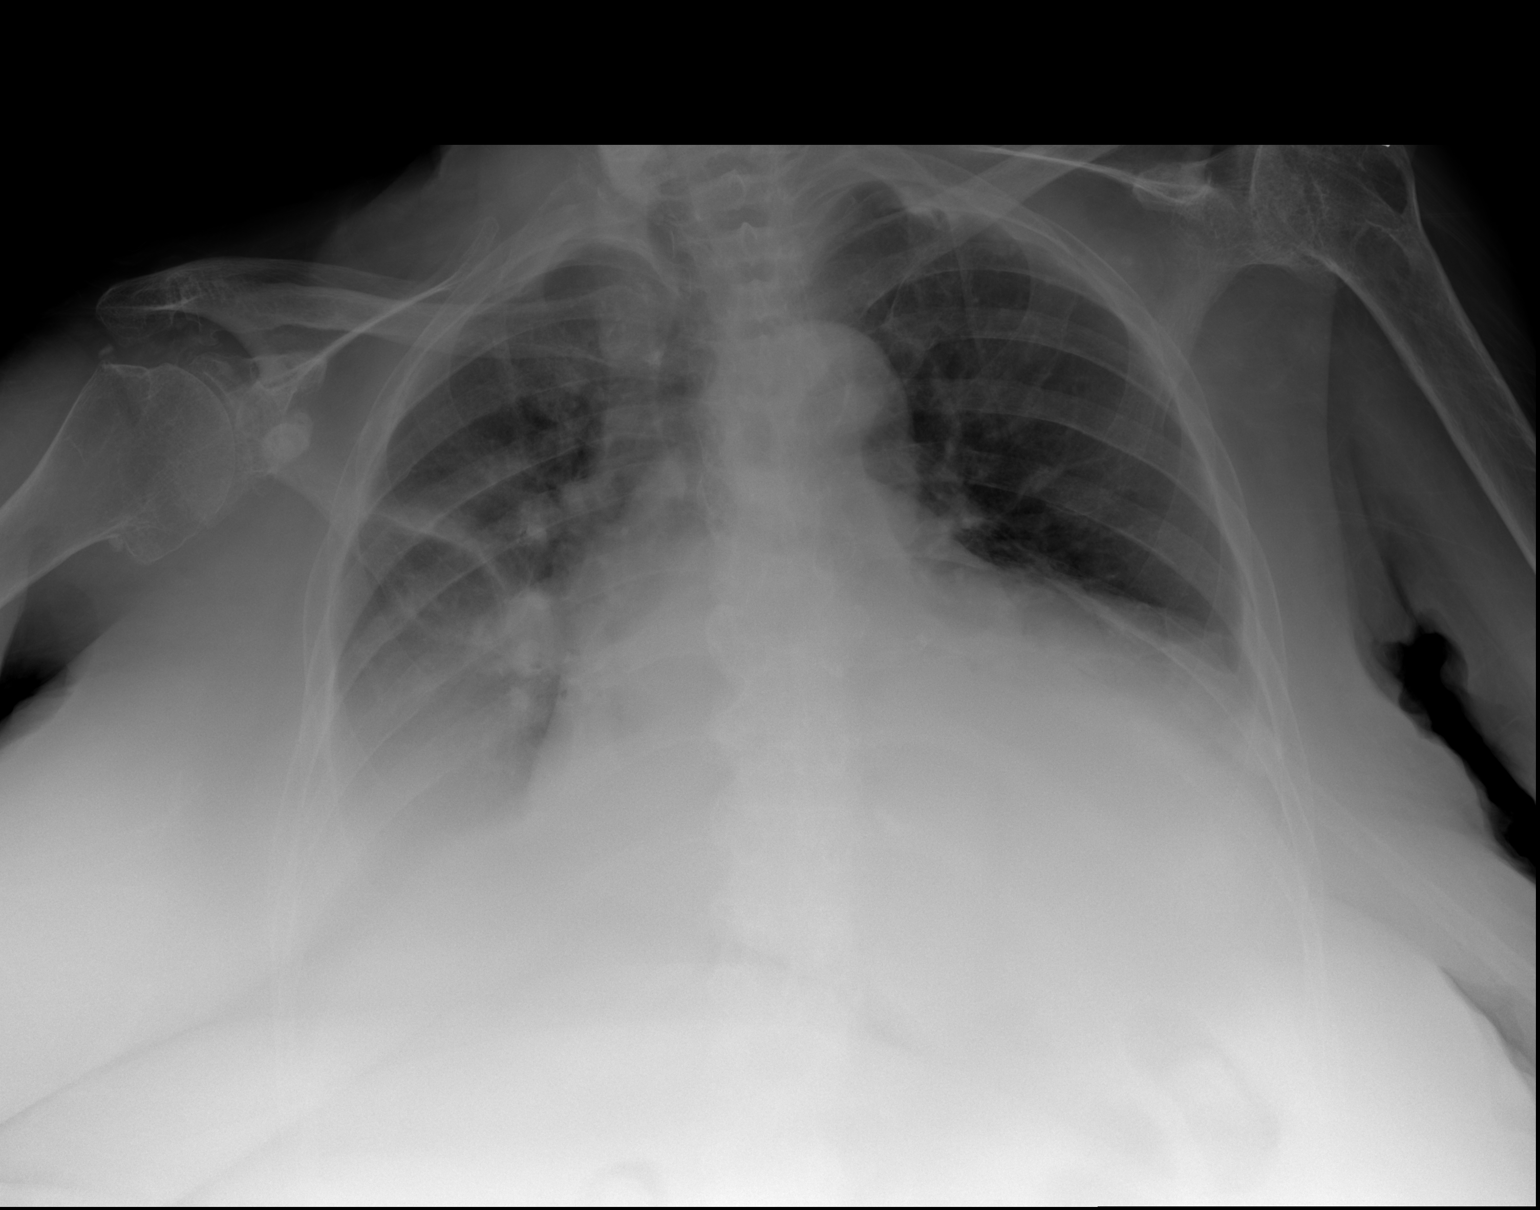

[2 of 2 positions shown; findings below may reference images not displayed]

FINDINGS: Stable enlarged cardiac silhouette. Interval moderate-sized left
pleural effusion. A moderate-sized left pleural effusion is again
demonstrated. Interval increased density in the right lower lung
zone and mildly increased density in the left lower lung zone.
Thoracic spine and bilateral shoulder degenerative changes.
IMPRESSION: 1. Interval moderate-sized right pleural effusion without
significant change in a moderate-sized left pleural effusion.
2. Interval right basilar atelectasis or pneumonia.
3. Mildly increased left basilar atelectasis or pneumonia.
4. Stable cardiomegaly.
